# Patient Record
Sex: Male | Born: 1950 | Race: White | Hispanic: No | Marital: Married | Smoking: Current every day smoker
Health system: Southern US, Community
[De-identification: ages and names within clinical notes are randomized; demographics above are authoritative.]

## PROBLEM LIST (undated history)

## (undated) ENCOUNTER — Emergency Department (HOSPITAL_COMMUNITY): Discharge status: Against Medical Advice | Payer: BC Managed Care – PPO

## (undated) DIAGNOSIS — R351 Nocturia: Secondary | ICD-10-CM

## (undated) DIAGNOSIS — G4733 Obstructive sleep apnea (adult) (pediatric): Secondary | ICD-10-CM

## (undated) DIAGNOSIS — M7552 Bursitis of left shoulder: Secondary | ICD-10-CM

## (undated) DIAGNOSIS — R05 Cough: Secondary | ICD-10-CM

## (undated) DIAGNOSIS — R0600 Dyspnea, unspecified: Secondary | ICD-10-CM

## (undated) DIAGNOSIS — I2510 Atherosclerotic heart disease of native coronary artery without angina pectoris: Secondary | ICD-10-CM

## (undated) DIAGNOSIS — K229 Disease of esophagus, unspecified: Secondary | ICD-10-CM

## (undated) DIAGNOSIS — M1990 Unspecified osteoarthritis, unspecified site: Secondary | ICD-10-CM

## (undated) DIAGNOSIS — M7551 Bursitis of right shoulder: Secondary | ICD-10-CM

## (undated) DIAGNOSIS — I5022 Chronic systolic (congestive) heart failure: Secondary | ICD-10-CM

## (undated) DIAGNOSIS — J410 Simple chronic bronchitis: Secondary | ICD-10-CM

## (undated) DIAGNOSIS — C679 Malignant neoplasm of bladder, unspecified: Secondary | ICD-10-CM

## (undated) DIAGNOSIS — I10 Essential (primary) hypertension: Secondary | ICD-10-CM

## (undated) DIAGNOSIS — N281 Cyst of kidney, acquired: Secondary | ICD-10-CM

## (undated) DIAGNOSIS — K219 Gastro-esophageal reflux disease without esophagitis: Secondary | ICD-10-CM

## (undated) DIAGNOSIS — N509 Disorder of male genital organs, unspecified: Secondary | ICD-10-CM

## (undated) DIAGNOSIS — Z9889 Other specified postprocedural states: Secondary | ICD-10-CM

## (undated) DIAGNOSIS — E119 Type 2 diabetes mellitus without complications: Secondary | ICD-10-CM

## (undated) DIAGNOSIS — G4730 Sleep apnea, unspecified: Secondary | ICD-10-CM

## (undated) DIAGNOSIS — R7303 Prediabetes: Secondary | ICD-10-CM

## (undated) DIAGNOSIS — E785 Hyperlipidemia, unspecified: Secondary | ICD-10-CM

## (undated) DIAGNOSIS — R0602 Shortness of breath: Secondary | ICD-10-CM

## (undated) DIAGNOSIS — Z85828 Personal history of other malignant neoplasm of skin: Secondary | ICD-10-CM

## (undated) HISTORY — PX: TONSILLECTOMY: SUR1361

## (undated) HISTORY — DX: Disease of esophagus, unspecified: K22.9

## (undated) HISTORY — DX: Type 2 diabetes mellitus without complications: E11.9

## (undated) HISTORY — DX: Hyperlipidemia, unspecified: E78.5

## (undated) HISTORY — DX: Cyst of kidney, acquired: N28.1

## (undated) HISTORY — DX: Atherosclerotic heart disease of native coronary artery without angina pectoris: I25.10

## (undated) HISTORY — DX: Chronic systolic (congestive) heart failure: I50.22

## (undated) HISTORY — DX: Disorder of male genital organs, unspecified: N50.9

## (undated) HISTORY — DX: Sleep apnea, unspecified: G47.30

---

## 1986-05-20 HISTORY — PX: ESOPHAGEAL DILATION: SHX303

## 1990-05-20 HISTORY — PX: UVULECTOMY: SHX2631

## 2002-03-31 ENCOUNTER — Ambulatory Visit: Admission: RE | Admit: 2002-03-31 | Discharge: 2002-03-31 | Payer: Self-pay | Admitting: Pulmonary Disease

## 2002-04-21 ENCOUNTER — Ambulatory Visit: Admission: RE | Admit: 2002-04-21 | Discharge: 2002-04-21 | Payer: Self-pay | Admitting: Pulmonary Disease

## 2002-07-12 ENCOUNTER — Other Ambulatory Visit: Admission: RE | Admit: 2002-07-12 | Discharge: 2002-07-12 | Payer: Self-pay | Admitting: Dermatology

## 2003-05-21 HISTORY — PX: UVULOPALATOPHARYNGOPLASTY: SHX827

## 2011-11-18 DIAGNOSIS — N281 Cyst of kidney, acquired: Secondary | ICD-10-CM

## 2011-11-18 HISTORY — DX: Cyst of kidney, acquired: N28.1

## 2012-01-15 ENCOUNTER — Encounter: Payer: Self-pay | Admitting: Nurse Practitioner

## 2012-02-20 ENCOUNTER — Other Ambulatory Visit: Payer: Self-pay | Admitting: Urology

## 2012-02-21 MED ORDER — MITOMYCIN CHEMO FOR BLADDER INSTILLATION 40 MG: 40.0000 mg | Freq: Once | INTRAVENOUS | Status: DC

## 2012-03-09 ENCOUNTER — Encounter (HOSPITAL_BASED_OUTPATIENT_CLINIC_OR_DEPARTMENT_OTHER): Payer: Self-pay | Admitting: *Deleted

## 2012-03-09 NOTE — Progress Notes (Signed)
NPO AFTER MN. ARRIVES AT 0615. NEEDS ISTAT AND EKG. PT TO CALL WITH NAME OF BACK BP/ DIABETIC MEDS.

## 2012-03-13 ENCOUNTER — Encounter (HOSPITAL_BASED_OUTPATIENT_CLINIC_OR_DEPARTMENT_OTHER): Payer: Self-pay | Admitting: Anesthesiology

## 2012-03-13 ENCOUNTER — Encounter (HOSPITAL_BASED_OUTPATIENT_CLINIC_OR_DEPARTMENT_OTHER): Payer: Self-pay | Admitting: *Deleted

## 2012-03-13 ENCOUNTER — Ambulatory Visit (HOSPITAL_BASED_OUTPATIENT_CLINIC_OR_DEPARTMENT_OTHER): Payer: BC Managed Care – PPO | Admitting: Anesthesiology

## 2012-03-13 ENCOUNTER — Ambulatory Visit (HOSPITAL_BASED_OUTPATIENT_CLINIC_OR_DEPARTMENT_OTHER)
Admission: RE | Admit: 2012-03-13 | Discharge: 2012-03-13 | Disposition: A | Discharge status: Home / Self Care | Payer: BC Managed Care – PPO | Source: Ambulatory Visit | Attending: Urology | Admitting: Urology

## 2012-03-13 ENCOUNTER — Encounter (HOSPITAL_BASED_OUTPATIENT_CLINIC_OR_DEPARTMENT_OTHER)
Admission: RE | Disposition: A | Discharge status: Home / Self Care | Payer: Self-pay | Source: Ambulatory Visit | Attending: Urology

## 2012-03-13 DIAGNOSIS — D494 Neoplasm of unspecified behavior of bladder: Secondary | ICD-10-CM

## 2012-03-13 DIAGNOSIS — Z79899 Other long term (current) drug therapy: Secondary | ICD-10-CM | POA: Insufficient documentation

## 2012-03-13 DIAGNOSIS — J4489 Other specified chronic obstructive pulmonary disease: Secondary | ICD-10-CM | POA: Insufficient documentation

## 2012-03-13 DIAGNOSIS — G4730 Sleep apnea, unspecified: Secondary | ICD-10-CM | POA: Insufficient documentation

## 2012-03-13 DIAGNOSIS — I10 Essential (primary) hypertension: Secondary | ICD-10-CM | POA: Insufficient documentation

## 2012-03-13 DIAGNOSIS — F17200 Nicotine dependence, unspecified, uncomplicated: Secondary | ICD-10-CM | POA: Insufficient documentation

## 2012-03-13 DIAGNOSIS — C679 Malignant neoplasm of bladder, unspecified: Secondary | ICD-10-CM | POA: Insufficient documentation

## 2012-03-13 DIAGNOSIS — Z85828 Personal history of other malignant neoplasm of skin: Secondary | ICD-10-CM | POA: Insufficient documentation

## 2012-03-13 DIAGNOSIS — M109 Gout, unspecified: Secondary | ICD-10-CM | POA: Insufficient documentation

## 2012-03-13 DIAGNOSIS — J449 Chronic obstructive pulmonary disease, unspecified: Secondary | ICD-10-CM | POA: Insufficient documentation

## 2012-03-13 DIAGNOSIS — E119 Type 2 diabetes mellitus without complications: Secondary | ICD-10-CM | POA: Insufficient documentation

## 2012-03-13 HISTORY — DX: Gastro-esophageal reflux disease without esophagitis: K21.9

## 2012-03-13 HISTORY — DX: Essential (primary) hypertension: I10

## 2012-03-13 HISTORY — DX: Prediabetes: R73.03

## 2012-03-13 HISTORY — PX: CYSTOSCOPY WITH BIOPSY: SHX5122

## 2012-03-13 HISTORY — DX: Shortness of breath: R06.02

## 2012-03-13 HISTORY — DX: Simple chronic bronchitis: J41.0

## 2012-03-13 HISTORY — DX: Nocturia: R35.1

## 2012-03-13 HISTORY — DX: Unspecified osteoarthritis, unspecified site: M19.90

## 2012-03-13 LAB — GLUCOSE, CAPILLARY: Glucose-Capillary: 126 mg/dL — ABNORMAL HIGH (ref 70–99)

## 2012-03-13 LAB — POCT I-STAT 4, (NA,K, GLUC, HGB,HCT)
Glucose, Bld: 149 mg/dL — ABNORMAL HIGH (ref 70–99)
Potassium: 3.9 mEq/L (ref 3.5–5.1)
Sodium: 141 mEq/L (ref 135–145)

## 2012-03-13 SURGERY — CYSTOSCOPY, WITH BIOPSY
Anesthesia: General | Site: Bladder | Wound class: Clean Contaminated

## 2012-03-13 MED ORDER — ONDANSETRON HCL 4 MG/2ML IJ SOLN
INTRAMUSCULAR | Status: DC | PRN
  Administered 2012-03-13: 4 mg via INTRAVENOUS

## 2012-03-13 MED ORDER — CEFAZOLIN SODIUM 1-5 GM-% IV SOLN: 1.0000 g | INTRAVENOUS | Status: DC

## 2012-03-13 MED ORDER — OXYCODONE-ACETAMINOPHEN 5-325 MG PO TABS: 1.0000 | ORAL_TABLET | ORAL | Status: DC | PRN

## 2012-03-13 MED ORDER — CIPROFLOXACIN HCL 500 MG PO TABS: 500.0000 mg | ORAL_TABLET | Freq: Two times a day (BID) | ORAL | Status: DC

## 2012-03-13 MED ORDER — LACTATED RINGERS IV SOLN: INTRAVENOUS | Status: DC

## 2012-03-13 MED ORDER — PROPOFOL 10 MG/ML IV BOLUS
INTRAVENOUS | Status: DC | PRN
  Administered 2012-03-13: 300 mg via INTRAVENOUS

## 2012-03-13 MED ORDER — CEFAZOLIN SODIUM-DEXTROSE 2-3 GM-% IV SOLR
2.0000 g | INTRAVENOUS | Status: AC
  Administered 2012-03-13: 3 g via INTRAVENOUS

## 2012-03-13 MED ORDER — MITOMYCIN CHEMO FOR BLADDER INSTILLATION 40 MG
40.0000 mg | Freq: Once | INTRAVENOUS | Status: AC
  Administered 2012-03-13: 40 mg via INTRAVESICAL
  Filled 2012-03-13: qty 40

## 2012-03-13 MED ORDER — LACTATED RINGERS IV SOLN
INTRAVENOUS | Status: DC
  Administered 2012-03-13 (×4): via INTRAVENOUS

## 2012-03-13 MED ORDER — BELLADONNA ALKALOIDS-OPIUM 16.2-60 MG RE SUPP
RECTAL | Status: DC | PRN
  Administered 2012-03-13: 1 via RECTAL

## 2012-03-13 MED ORDER — MITOMYCIN CHEMO FOR BLADDER INSTILLATION 40 MG: 40.0000 mg | Freq: Once | INTRAVENOUS | Status: DC

## 2012-03-13 MED ORDER — MEPERIDINE HCL 25 MG/ML IJ SOLN: 6.2500 mg | INTRAMUSCULAR | Status: DC | PRN

## 2012-03-13 MED ORDER — STERILE WATER FOR IRRIGATION IR SOLN
Status: DC | PRN
  Administered 2012-03-13: 3000 mL via INTRAVESICAL

## 2012-03-13 MED ORDER — LIDOCAINE HCL (CARDIAC) 20 MG/ML IV SOLN
INTRAVENOUS | Status: DC | PRN
  Administered 2012-03-13: 100 mg via INTRAVENOUS

## 2012-03-13 MED ORDER — PROMETHAZINE HCL 25 MG/ML IJ SOLN: 6.2500 mg | INTRAMUSCULAR | Status: DC | PRN

## 2012-03-13 MED ORDER — FENTANYL CITRATE 0.05 MG/ML IJ SOLN
25.0000 ug | INTRAMUSCULAR | Status: DC | PRN
  Administered 2012-03-13 (×2): 25 ug via INTRAVENOUS

## 2012-03-13 MED ORDER — PHENAZOPYRIDINE HCL 200 MG PO TABS: 200.0000 mg | ORAL_TABLET | Freq: Three times a day (TID) | ORAL | Status: DC | PRN

## 2012-03-13 MED ORDER — FENTANYL CITRATE 0.05 MG/ML IJ SOLN
INTRAMUSCULAR | Status: DC | PRN
  Administered 2012-03-13 (×2): 25 ug via INTRAVENOUS
  Administered 2012-03-13: 50 ug via INTRAVENOUS
  Administered 2012-03-13 (×2): 25 ug via INTRAVENOUS

## 2012-03-13 SURGICAL SUPPLY — 17 items
BAG DRAIN URO-CYSTO SKYTR STRL (DRAIN) ×2 IMPLANT
BAG DRN UROCATH (DRAIN) ×1
BAG URINE DRAINAGE (UROLOGICAL SUPPLIES) ×1 IMPLANT
CANISTER SUCT LVC 12 LTR MEDI- (MISCELLANEOUS) ×1 IMPLANT
CATH FOLEY 2WAY SLVR  5CC 18FR (CATHETERS) ×1
CATH FOLEY 2WAY SLVR 5CC 18FR (CATHETERS) IMPLANT
CLOTH BEACON ORANGE TIMEOUT ST (SAFETY) ×2 IMPLANT
DRAPE CAMERA CLOSED 9X96 (DRAPES) ×2 IMPLANT
ELECT REM PT RETURN 9FT ADLT (ELECTROSURGICAL) ×2
ELECTRODE REM PT RTRN 9FT ADLT (ELECTROSURGICAL) ×1 IMPLANT
GLOVE BIO SURGEON STRL SZ7.5 (GLOVE) ×2 IMPLANT
GLOVE INDICATOR 6.5 STRL GRN (GLOVE) ×1 IMPLANT
GOWN STRL REIN XL XLG (GOWN DISPOSABLE) ×2 IMPLANT
NEEDLE HYPO 22GX1.5 SAFETY (NEEDLE) IMPLANT
NS IRRIG 500ML POUR BTL (IV SOLUTION) IMPLANT
PACK CYSTOSCOPY (CUSTOM PROCEDURE TRAY) ×2 IMPLANT
WATER STERILE IRR 3000ML UROMA (IV SOLUTION) ×2 IMPLANT

## 2012-03-13 NOTE — Op Note (Signed)
Preoperative diagnosis: Bladder neoplasm, gross hematuria Postoperative diagnosis: Bladder neoplasm, gross hematuria  Procedure: Exam under anesthesia  Cystoscopy, bladder biopsy with resection tumor and fulguration Mitomycin-C installation in PACU  Surgeon: Mena Goes   Anesthesia: Gen.  Findings: Exam under anesthesia-the penis was normal without lesion, the testicles were normal without mass. On digital rectal exam the prostate was small without hard area or nodule. A B&O suppository was placed. Cystoscopy-the anterior urethra was normal. The prostatic urethra was short and nonobstructing although there was a high bladder neck. The bladder was examined with a 70 and 12 lens and noted to be normal apart from a papillary tumor lateral to the right ureteral orifice. No other lesions were noted. The trigone and ureteral orifice these were in their normal anatomic position.  Description of procedure: After consent was obtained patient brought to the operating room. A timeout was performed to confirm the patient and procedure. After adequate anesthesia he is placed in lithotomy position and exam was performed.   He was prepped and draped in the usual fashion and a cystoscope was passed per urethra. The bladder was examined with a 12 and 70 lens. The tumor lateral to the right ureteral orifice was papillary in appearance and on a single stalk. The cold cup biopsy forceps were placed and by opening the forceps I was able to get around the base of the tumor by slightly elevating the tumor with the forceps. It took 2-3 biopsies to remove the base with the tumor. It took 1 more biopsy and the tumor base for staging and sent this separately. Adequate hemostasis was then obtained with the Bugbee and fulguration. The ureteral orifice these had good clear reflux bilaterally after biopsy and fulguration. The patient was awakened and taken to the recovery room in stable condition. There were no complications and  blood loss was minimal.  Drains: 75 French Foley which will be removed in the recovery room  Specimens: #1 bladder tumor #2 bladder tumor base to pathology  Disposition: Patient stable to PACU

## 2012-03-13 NOTE — Interval H&P Note (Signed)
History and Physical Interval Note:  03/13/2012 7:30 AM  Timothy Lee  has presented today for surgery, with the diagnosis of Bladder Neoplasm  The various methods of treatment have been discussed with the patient and family. After consideration of risks, benefits and other options for treatment, the patient has consented to  Procedure(s) (LRB) with comments: CYSTOSCOPY WITH BIOPSY (N/A) - 90 mins requested for this case  BLADDER BIOPSY  [phone] home # [phone]  [employer]  **ADDRESS CORRECTION [address] [place] [address] as a surgical intervention .  We also discussed Mitomycin-C instillation in PACU. We discussed side effects of the proposed treatment, the likelihood of the patient achieving the goals of the procedure, and any potential problems that might occur during the procedure or recuperation. The patient's history has been reviewed, patient examined, no change in status, stable for surgery.  I have reviewed the patient's chart and labs.  Questions were answered to the patient's satisfaction.    We discussed the propensity of bladder neoplasms to recur and progress and the importance of f/u for all future clinic visits.    Antony Haste
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
## 2012-03-13 NOTE — Progress Notes (Signed)
Foley cath unpluged and hooked to straight drain

## 2012-03-13 NOTE — Anesthesia Postprocedure Evaluation (Signed)
  Anesthesia Post-op Note  Patient: Timothy Lee  Procedure(s) Performed: Procedure(s) (LRB): CYSTOSCOPY WITH BIOPSY (N/A)  Patient Location: PACU  Anesthesia Type: General  Level of Consciousness: awake and alert   Airway and Oxygen Therapy: Patient Spontanous Breathing  Post-op Pain: mild  Post-op Assessment: Post-op Vital signs reviewed, Patient's Cardiovascular Status Stable, Respiratory Function Stable, Patent Airway and No signs of Nausea or vomiting  Post-op Vital Signs: stable  Complications: No apparent anesthesia complications

## 2012-03-13 NOTE — Transfer of Care (Signed)
Immediate Anesthesia Transfer of Care Note  Patient: Timothy Lee  Procedure(s) Performed: Procedure(s) (LRB): CYSTOSCOPY WITH BIOPSY (N/A)  Patient Location: Patient transported to PACU with oxygen via face mask at 4 Liters / Min  Anesthesia Type: General  Level of Consciousness: awake and alert   Airway & Oxygen Therapy: Patient Spontanous Breathing and Patient connected to face mask oxygen  Post-op Assessment: Report given to PACU RN and Post -op Vital signs reviewed and stable  Post vital signs: Reviewed and stable  Dentition: Teeth and oropharynx remain in pre-op condition  Complications: No apparent anesthesia complications

## 2012-03-13 NOTE — Anesthesia Procedure Notes (Signed)
Procedure Name: LMA Insertion Date/Time: 03/13/2012 7:38 AM Performed by: Fran Lowes Pre-anesthesia Checklist: Patient identified, Emergency Drugs available, Suction available and Patient being monitored Patient Re-evaluated:Patient Re-evaluated prior to inductionOxygen Delivery Method: Circle System Utilized Preoxygenation: Pre-oxygenation with 100% oxygen Intubation Type: IV induction Ventilation: Mask ventilation without difficulty LMA: LMA inserted LMA Size: 5.0 Number of attempts: 1 Airway Equipment and Method: bite block Placement Confirmation: positive ETCO2 Tube secured with: Tape Dental Injury: Teeth and Oropharynx as per pre-operative assessment

## 2012-03-13 NOTE — Anesthesia Preprocedure Evaluation (Addendum)
Anesthesia Evaluation  Patient identified by MRN, date of birth, ID band Patient awake    Reviewed: Allergy & Precautions, H&P , NPO status , Patient's Chart, lab work & pertinent test results  Airway Mallampati: II TM Distance: >3 FB Neck ROM: Full    Dental No notable dental hx.    Pulmonary shortness of breath and with exertion, sleep apnea , COPDCurrent Smoker,  + rhonchi   Pulmonary exam normal       Cardiovascular hypertension, Pt. on medications negative cardio ROS  Rhythm:Regular Rate:Normal     Neuro/Psych negative neurological ROS  negative psych ROS   GI/Hepatic negative GI ROS, Neg liver ROS,   Endo/Other  negative endocrine ROSdiabetes, Oral Hypoglycemic Agents  Renal/GU negative Renal ROS  negative genitourinary   Musculoskeletal negative musculoskeletal ROS (+)   Abdominal   Peds negative pediatric ROS (+)  Hematology negative hematology ROS (+)   Anesthesia Other Findings Upper and lower front bridges  Reproductive/Obstetrics negative OB ROS                          Anesthesia Physical Anesthesia Plan  ASA: III  Anesthesia Plan: General   Post-op Pain Management:    Induction: Intravenous  Airway Management Planned: LMA  Additional Equipment:   Intra-op Plan:   Post-operative Plan: Extubation in OR  Informed Consent: I have reviewed the patients History and Physical, chart, labs and discussed the procedure including the risks, benefits and alternatives for the proposed anesthesia with the patient or authorized representative who has indicated his/her understanding and acceptance.   Dental advisory given  Plan Discussed with: CRNA  Anesthesia Plan Comments:         Anesthesia Quick Evaluation

## 2012-03-16 ENCOUNTER — Encounter (HOSPITAL_BASED_OUTPATIENT_CLINIC_OR_DEPARTMENT_OTHER): Payer: Self-pay | Admitting: Urology

## 2013-05-27 LAB — BASIC METABOLIC PANEL
BUN: 9 (ref 4–21)
CREATININE: 0.8 (ref 0.6–1.3)
GLUCOSE: 170
Potassium: 4.1 (ref 3.4–5.3)
Sodium: 135 — AB (ref 137–147)

## 2013-05-27 LAB — HEMOGLOBIN A1C: HEMOGLOBIN A1C: 8

## 2013-05-27 LAB — LIPID PANEL
CHOLESTEROL: 149 (ref 0–200)
HDL: 42 (ref 35–70)
LDL Cholesterol: 84
Triglycerides: 114 (ref 40–160)

## 2013-05-27 LAB — CBC AND DIFFERENTIAL
HCT: 47 (ref 41–53)
Hemoglobin: 16 (ref 13.5–17.5)
PLATELETS: 269 (ref 150–399)
WBC: 7.1

## 2013-05-27 LAB — HEPATIC FUNCTION PANEL
ALK PHOS: 61 (ref 25–125)
ALT: 35 (ref 10–40)
AST: 18 (ref 14–40)
Bilirubin, Total: 1.1

## 2014-08-17 ENCOUNTER — Other Ambulatory Visit: Payer: Self-pay | Admitting: Dermatology

## 2016-09-25 ENCOUNTER — Other Ambulatory Visit: Payer: Self-pay | Admitting: Dermatology

## 2016-09-25 DIAGNOSIS — D492 Neoplasm of unspecified behavior of bone, soft tissue, and skin: Secondary | ICD-10-CM | POA: Diagnosis not present

## 2016-09-25 DIAGNOSIS — L219 Seborrheic dermatitis, unspecified: Secondary | ICD-10-CM | POA: Diagnosis not present

## 2016-09-25 DIAGNOSIS — D229 Melanocytic nevi, unspecified: Secondary | ICD-10-CM | POA: Diagnosis not present

## 2016-09-25 DIAGNOSIS — L570 Actinic keratosis: Secondary | ICD-10-CM | POA: Diagnosis not present

## 2016-09-25 DIAGNOSIS — C44319 Basal cell carcinoma of skin of other parts of face: Secondary | ICD-10-CM | POA: Diagnosis not present

## 2016-10-10 ENCOUNTER — Other Ambulatory Visit: Payer: Self-pay | Admitting: Dermatology

## 2016-10-10 DIAGNOSIS — C44319 Basal cell carcinoma of skin of other parts of face: Secondary | ICD-10-CM | POA: Diagnosis not present

## 2016-10-14 DIAGNOSIS — R2232 Localized swelling, mass and lump, left upper limb: Secondary | ICD-10-CM | POA: Diagnosis not present

## 2016-10-14 DIAGNOSIS — M79602 Pain in left arm: Secondary | ICD-10-CM | POA: Diagnosis not present

## 2016-10-15 DIAGNOSIS — R2232 Localized swelling, mass and lump, left upper limb: Secondary | ICD-10-CM | POA: Diagnosis not present

## 2016-10-16 DIAGNOSIS — Z4802 Encounter for removal of sutures: Secondary | ICD-10-CM | POA: Diagnosis not present

## 2016-12-26 DIAGNOSIS — C675 Malignant neoplasm of bladder neck: Secondary | ICD-10-CM | POA: Diagnosis not present

## 2016-12-26 DIAGNOSIS — C672 Malignant neoplasm of lateral wall of bladder: Secondary | ICD-10-CM | POA: Diagnosis not present

## 2016-12-26 LAB — BASIC METABOLIC PANEL
BUN: 15 (ref 4–21)
Creatinine: 0.5 — AB (ref 0.6–1.3)

## 2017-01-06 ENCOUNTER — Encounter: Payer: Self-pay | Admitting: Nurse Practitioner

## 2017-01-06 DIAGNOSIS — C672 Malignant neoplasm of lateral wall of bladder: Secondary | ICD-10-CM | POA: Diagnosis not present

## 2017-01-06 DIAGNOSIS — D494 Neoplasm of unspecified behavior of bladder: Secondary | ICD-10-CM | POA: Diagnosis not present

## 2017-01-06 DIAGNOSIS — D3500 Benign neoplasm of unspecified adrenal gland: Secondary | ICD-10-CM | POA: Diagnosis not present

## 2017-01-14 LAB — PSA: PSA: 1.88

## 2017-01-16 ENCOUNTER — Other Ambulatory Visit: Payer: Self-pay | Admitting: Urology

## 2017-01-16 MED ORDER — SODIUM CHLORIDE 0.9 % IV SOLN: 50.0000 mg | Freq: Once | INTRAVENOUS | Status: DC

## 2017-01-27 ENCOUNTER — Encounter (HOSPITAL_BASED_OUTPATIENT_CLINIC_OR_DEPARTMENT_OTHER): Payer: Self-pay | Admitting: *Deleted

## 2017-03-11 ENCOUNTER — Encounter (HOSPITAL_BASED_OUTPATIENT_CLINIC_OR_DEPARTMENT_OTHER): Admission: RE | Payer: Self-pay | Source: Ambulatory Visit

## 2017-03-11 ENCOUNTER — Ambulatory Visit (HOSPITAL_BASED_OUTPATIENT_CLINIC_OR_DEPARTMENT_OTHER): Admission: RE | Admit: 2017-03-11 | Payer: BLUE CROSS/BLUE SHIELD | Source: Ambulatory Visit | Admitting: Urology

## 2017-03-11 HISTORY — DX: Malignant neoplasm of bladder, unspecified: C67.9

## 2017-03-11 HISTORY — DX: Other specified postprocedural states: Z98.890

## 2017-03-11 HISTORY — DX: Other specified postprocedural states: Z85.828

## 2017-03-11 SURGERY — TURBT (TRANSURETHRAL RESECTION OF BLADDER TUMOR)
Anesthesia: General

## 2017-05-05 ENCOUNTER — Other Ambulatory Visit: Payer: Self-pay | Admitting: Urology

## 2017-05-15 ENCOUNTER — Other Ambulatory Visit: Payer: Self-pay

## 2017-05-15 ENCOUNTER — Encounter (HOSPITAL_BASED_OUTPATIENT_CLINIC_OR_DEPARTMENT_OTHER): Payer: Self-pay | Admitting: *Deleted

## 2017-05-15 NOTE — Progress Notes (Signed)
SPOKE W/ PT VIA PHONE FOR PRE-OP INTERVIEW.  NPO AFTER MN.  ARRIVE AT 7125.  NEEDS ISTAT AND EKG.

## 2017-05-22 ENCOUNTER — Encounter: Payer: Self-pay | Admitting: Nurse Practitioner

## 2017-05-22 ENCOUNTER — Encounter: Payer: Self-pay | Admitting: Urology

## 2017-05-22 DIAGNOSIS — R351 Nocturia: Secondary | ICD-10-CM | POA: Diagnosis not present

## 2017-05-22 DIAGNOSIS — C672 Malignant neoplasm of lateral wall of bladder: Secondary | ICD-10-CM | POA: Diagnosis not present

## 2017-05-23 ENCOUNTER — Ambulatory Visit (HOSPITAL_BASED_OUTPATIENT_CLINIC_OR_DEPARTMENT_OTHER): Payer: Medicare Other | Admitting: Anesthesiology

## 2017-05-23 ENCOUNTER — Ambulatory Visit (HOSPITAL_BASED_OUTPATIENT_CLINIC_OR_DEPARTMENT_OTHER)
Admission: RE | Admit: 2017-05-23 | Discharge: 2017-05-23 | Disposition: A | Discharge status: Home / Self Care | Payer: Medicare Other | Source: Ambulatory Visit | Attending: Urology | Admitting: Urology

## 2017-05-23 ENCOUNTER — Encounter (HOSPITAL_BASED_OUTPATIENT_CLINIC_OR_DEPARTMENT_OTHER): Payer: Self-pay | Admitting: *Deleted

## 2017-05-23 ENCOUNTER — Encounter (HOSPITAL_BASED_OUTPATIENT_CLINIC_OR_DEPARTMENT_OTHER)
Admission: RE | Disposition: A | Discharge status: Home / Self Care | Payer: Self-pay | Source: Ambulatory Visit | Attending: Urology

## 2017-05-23 ENCOUNTER — Other Ambulatory Visit: Payer: Self-pay

## 2017-05-23 DIAGNOSIS — Z6833 Body mass index (BMI) 33.0-33.9, adult: Secondary | ICD-10-CM | POA: Insufficient documentation

## 2017-05-23 DIAGNOSIS — Z820 Family history of epilepsy and other diseases of the nervous system: Secondary | ICD-10-CM | POA: Insufficient documentation

## 2017-05-23 DIAGNOSIS — E669 Obesity, unspecified: Secondary | ICD-10-CM | POA: Diagnosis not present

## 2017-05-23 DIAGNOSIS — D414 Neoplasm of uncertain behavior of bladder: Secondary | ICD-10-CM

## 2017-05-23 DIAGNOSIS — E119 Type 2 diabetes mellitus without complications: Secondary | ICD-10-CM | POA: Diagnosis not present

## 2017-05-23 DIAGNOSIS — C675 Malignant neoplasm of bladder neck: Secondary | ICD-10-CM | POA: Diagnosis not present

## 2017-05-23 DIAGNOSIS — Z79899 Other long term (current) drug therapy: Secondary | ICD-10-CM | POA: Insufficient documentation

## 2017-05-23 DIAGNOSIS — C672 Malignant neoplasm of lateral wall of bladder: Secondary | ICD-10-CM | POA: Diagnosis not present

## 2017-05-23 DIAGNOSIS — G4730 Sleep apnea, unspecified: Secondary | ICD-10-CM | POA: Insufficient documentation

## 2017-05-23 DIAGNOSIS — I10 Essential (primary) hypertension: Secondary | ICD-10-CM | POA: Diagnosis not present

## 2017-05-23 DIAGNOSIS — F17200 Nicotine dependence, unspecified, uncomplicated: Secondary | ICD-10-CM | POA: Insufficient documentation

## 2017-05-23 DIAGNOSIS — C679 Malignant neoplasm of bladder, unspecified: Secondary | ICD-10-CM | POA: Diagnosis present

## 2017-05-23 HISTORY — DX: Bursitis of right shoulder: M75.51

## 2017-05-23 HISTORY — DX: Cough: R05

## 2017-05-23 HISTORY — PX: TRANSURETHRAL RESECTION OF BLADDER TUMOR: SHX2575

## 2017-05-23 HISTORY — DX: Bursitis of left shoulder: M75.52

## 2017-05-23 HISTORY — DX: Obstructive sleep apnea (adult) (pediatric): G47.33

## 2017-05-23 LAB — POCT I-STAT, CHEM 8
BUN: 19 mg/dL (ref 6–20)
CREATININE: 0.7 mg/dL (ref 0.61–1.24)
Calcium, Ion: 1.27 mmol/L (ref 1.15–1.40)
Chloride: 100 mmol/L — ABNORMAL LOW (ref 101–111)
GLUCOSE: 252 mg/dL — AB (ref 65–99)
HCT: 49 % (ref 39.0–52.0)
HEMOGLOBIN: 16.7 g/dL (ref 13.0–17.0)
Potassium: 4.1 mmol/L (ref 3.5–5.1)
Sodium: 140 mmol/L (ref 135–145)
TCO2: 27 mmol/L (ref 22–32)

## 2017-05-23 LAB — GLUCOSE, CAPILLARY: GLUCOSE-CAPILLARY: 222 mg/dL — AB (ref 65–99)

## 2017-05-23 SURGERY — TURBT (TRANSURETHRAL RESECTION OF BLADDER TUMOR)
Anesthesia: General

## 2017-05-23 MED ORDER — DEXAMETHASONE SODIUM PHOSPHATE 10 MG/ML IJ SOLN
INTRAMUSCULAR | Status: DC | PRN
  Administered 2017-05-23: 5 mg via INTRAVENOUS

## 2017-05-23 MED ORDER — FENTANYL CITRATE (PF) 100 MCG/2ML IJ SOLN
INTRAMUSCULAR | Status: DC | PRN
  Administered 2017-05-23 (×3): 50 ug via INTRAVENOUS

## 2017-05-23 MED ORDER — PROMETHAZINE HCL 25 MG/ML IJ SOLN
6.2500 mg | INTRAMUSCULAR | Status: DC | PRN
  Filled 2017-05-23: qty 1

## 2017-05-23 MED ORDER — LIDOCAINE 2% (20 MG/ML) 5 ML SYRINGE
INTRAMUSCULAR | Status: DC | PRN
  Administered 2017-05-23: 100 mg via INTRAVENOUS

## 2017-05-23 MED ORDER — CEFAZOLIN SODIUM-DEXTROSE 2-4 GM/100ML-% IV SOLN
INTRAVENOUS | Status: AC
  Filled 2017-05-23: qty 100

## 2017-05-23 MED ORDER — PROPOFOL 10 MG/ML IV BOLUS
INTRAVENOUS | Status: DC | PRN
  Administered 2017-05-23: 200 mg via INTRAVENOUS

## 2017-05-23 MED ORDER — ONDANSETRON HCL 4 MG/2ML IJ SOLN
INTRAMUSCULAR | Status: AC
  Filled 2017-05-23: qty 2

## 2017-05-23 MED ORDER — MIDAZOLAM HCL 2 MG/2ML IJ SOLN
INTRAMUSCULAR | Status: AC
Start: 2017-05-23 — End: 2017-05-23
  Filled 2017-05-23: qty 2

## 2017-05-23 MED ORDER — BELLADONNA ALKALOIDS-OPIUM 16.2-60 MG RE SUPP
RECTAL | Status: AC
  Filled 2017-05-23: qty 1

## 2017-05-23 MED ORDER — FENTANYL CITRATE (PF) 100 MCG/2ML IJ SOLN
INTRAMUSCULAR | Status: AC
  Filled 2017-05-23: qty 2

## 2017-05-23 MED ORDER — CEFAZOLIN SODIUM-DEXTROSE 2-4 GM/100ML-% IV SOLN
2.0000 g | Freq: Once | INTRAVENOUS | Status: AC
  Administered 2017-05-23: 2 g via INTRAVENOUS
  Filled 2017-05-23: qty 100

## 2017-05-23 MED ORDER — DEXAMETHASONE SODIUM PHOSPHATE 10 MG/ML IJ SOLN
INTRAMUSCULAR | Status: AC
  Filled 2017-05-23: qty 1

## 2017-05-23 MED ORDER — SODIUM CHLORIDE 0.9 % IR SOLN
Status: DC | PRN
Start: 2017-05-23 — End: 2017-05-23
  Administered 2017-05-23 (×2): 3000 mL via INTRAVESICAL

## 2017-05-23 MED ORDER — LIDOCAINE 2% (20 MG/ML) 5 ML SYRINGE
INTRAMUSCULAR | Status: AC
  Filled 2017-05-23: qty 5

## 2017-05-23 MED ORDER — PROPOFOL 10 MG/ML IV BOLUS
INTRAVENOUS | Status: AC
  Filled 2017-05-23: qty 40

## 2017-05-23 MED ORDER — MIDAZOLAM HCL 5 MG/5ML IJ SOLN
INTRAMUSCULAR | Status: DC | PRN
  Administered 2017-05-23: 2 mg via INTRAVENOUS

## 2017-05-23 MED ORDER — HYDROMORPHONE HCL 1 MG/ML IJ SOLN
0.2500 mg | INTRAMUSCULAR | Status: DC | PRN
  Filled 2017-05-23: qty 0.5

## 2017-05-23 MED ORDER — FENTANYL CITRATE (PF) 100 MCG/2ML IJ SOLN
25.0000 ug | INTRAMUSCULAR | Status: DC | PRN
  Administered 2017-05-23: 50 ug via INTRAVENOUS
  Filled 2017-05-23: qty 1

## 2017-05-23 MED ORDER — LIDOCAINE HCL 2 % EX GEL
CUTANEOUS | Status: DC | PRN
  Administered 2017-05-23: 1 via URETHRAL

## 2017-05-23 MED ORDER — LACTATED RINGERS IV SOLN
INTRAVENOUS | Status: DC
  Administered 2017-05-23: 08:00:00 via INTRAVENOUS
  Filled 2017-05-23: qty 1000

## 2017-05-23 MED ORDER — ONDANSETRON HCL 4 MG/2ML IJ SOLN
INTRAMUSCULAR | Status: DC | PRN
  Administered 2017-05-23: 4 mg via INTRAVENOUS

## 2017-05-23 MED ORDER — SODIUM CHLORIDE 0.9 % IV SOLN
50.0000 mg | Freq: Once | INTRAVENOUS | Status: AC
  Administered 2017-05-23: 50 mg via INTRAVESICAL
  Filled 2017-05-23: qty 25

## 2017-05-23 SURGICAL SUPPLY — 26 items
BAG DRAIN URO-CYSTO SKYTR STRL (DRAIN) ×2 IMPLANT
BAG DRN ANRFLXCHMBR STRAP LEK (BAG)
BAG DRN UROCATH (DRAIN) ×1
BAG URINE DRAINAGE (UROLOGICAL SUPPLIES) IMPLANT
BAG URINE LEG 19OZ MD ST LTX (BAG) IMPLANT
CATH FOLEY 2WAY SLVR  5CC 16FR (CATHETERS) ×1
CATH FOLEY 2WAY SLVR  5CC 20FR (CATHETERS)
CATH FOLEY 2WAY SLVR  5CC 22FR (CATHETERS)
CATH FOLEY 2WAY SLVR 5CC 16FR (CATHETERS) IMPLANT
CATH FOLEY 2WAY SLVR 5CC 20FR (CATHETERS) IMPLANT
CATH FOLEY 2WAY SLVR 5CC 22FR (CATHETERS) IMPLANT
CLOTH BEACON ORANGE TIMEOUT ST (SAFETY) ×2 IMPLANT
DRSG TELFA 3X8 NADH (GAUZE/BANDAGES/DRESSINGS) IMPLANT
ELECT REM PT RETURN 9FT ADLT (ELECTROSURGICAL) ×2
ELECTRODE REM PT RTRN 9FT ADLT (ELECTROSURGICAL) ×1 IMPLANT
EVACUATOR MICROVAS BLADDER (UROLOGICAL SUPPLIES) IMPLANT
GLOVE BIO SURGEON STRL SZ7.5 (GLOVE) ×2 IMPLANT
GOWN STRL REUS W/TWL LRG LVL3 (GOWN DISPOSABLE) ×2 IMPLANT
HOLDER FOLEY CATH W/STRAP (MISCELLANEOUS) IMPLANT
KIT RM TURNOVER CYSTO AR (KITS) ×2 IMPLANT
LOOP CUT BIPOLAR 24F LRG (ELECTROSURGICAL) ×1 IMPLANT
MANIFOLD NEPTUNE II (INSTRUMENTS) IMPLANT
PACK CYSTO (CUSTOM PROCEDURE TRAY) ×2 IMPLANT
PAD DRESSING TELFA 3X8 NADH (GAUZE/BANDAGES/DRESSINGS) IMPLANT
PLUG CATH AND CAP STER (CATHETERS) IMPLANT
TUBE CONNECTING 12X1/4 (SUCTIONS) IMPLANT

## 2017-05-23 NOTE — Anesthesia Postprocedure Evaluation (Signed)
Anesthesia Post Note  Patient: Timothy Lee  Procedure(s) Performed: TRANSURETHRAL RESECTION OF BLADDER TUMOR / (TURBT) WITH INSTILLATION OF EPIRUBICIN (N/A )     Patient location during evaluation: PACU Anesthesia Type: General Level of consciousness: awake and alert Pain management: pain level controlled Vital Signs Assessment: post-procedure vital signs reviewed and stable Respiratory status: spontaneous breathing, nonlabored ventilation, respiratory function stable and patient connected to nasal cannula oxygen Cardiovascular status: blood pressure returned to baseline and stable Postop Assessment: no apparent nausea or vomiting Anesthetic complications: no    Last Vitals:  Vitals:   05/23/17 1015 05/23/17 1030  BP: 135/85 137/86  Pulse: 95 86  Resp: (!) 22 17  Temp:    SpO2: 95% 96%    Last Pain:  Vitals:   05/23/17 0656  TempSrc: Oral                 Marra Fraga S

## 2017-05-23 NOTE — Interval H&P Note (Signed)
History and Physical Interval Note:  05/23/2017 9:28 AM  Timothy Lee  has presented today for surgery, with the diagnosis of BLADDER CANCER, LATERAL  The various methods of treatment have been discussed with the patient and family. After consideration of risks, benefits and other options for treatment, the patient has consented to  Procedure(s): TRANSURETHRAL RESECTION OF BLADDER TUMOR / (TURBT) WITH INSTILLATION OF EPIRUBICIN (N/A) as a surgical intervention.  The patient's history has been reviewed, patient examined, no change in status, stable for surgery. Discussed again he has a potenitially life threatening condition and the importance of f/u (not waiting months/years in between visits and procedures). Discussed he may need a foley post-op and may need a staged procedure with return to OR. We also discussed again link between smoking and GU cancer. His wife said "he's lost the power to make health care decisions".  I have reviewed the patient's chart and labs.  Questions were answered to the patient's satisfaction.     [name]

## 2017-05-23 NOTE — Anesthesia Procedure Notes (Signed)
Procedure Name: LMA Insertion Date/Time: 05/23/2017 9:35 AM Performed by: Bonney Aid, CRNA Pre-anesthesia Checklist: Patient identified, Emergency Drugs available, Suction available and Patient being monitored Patient Re-evaluated:Patient Re-evaluated prior to induction Oxygen Delivery Method: Circle system utilized Preoxygenation: Pre-oxygenation with 100% oxygen Induction Type: IV induction Ventilation: Mask ventilation without difficulty LMA: LMA inserted LMA Size: 5.0 Number of attempts: 1 Airway Equipment and Method: Bite block Placement Confirmation: positive ETCO2 Tube secured with: Tape Dental Injury: Teeth and Oropharynx as per pre-operative assessment

## 2017-05-23 NOTE — Discharge Instructions (Signed)
Cystoscopy, Care After °Refer to this sheet in the next few weeks. These instructions provide you with information about caring for yourself after your procedure. Your health care provider may also give you more specific instructions. Your treatment has been planned according to current medical practices, but problems sometimes occur. Call your health care provider if you have any problems or questions after your procedure. °What can I expect after the procedure? °After the procedure, it is common to have: °· Mild pain when you urinate. Pain should stop within a few minutes after you urinate. This may last for up to 1 week. °· A small amount of blood in your urine for several days. °· Feeling like you need to urinate but producing only a small amount of urine. ° °Follow these instructions at home: ° °Medicines °· Take over-the-counter and prescription medicines only as told by your health care provider. °· If you were prescribed an antibiotic medicine, take it as told by your health care provider. Do not stop taking the antibiotic even if you start to feel better. °General instructions ° °· Return to your normal activities as told by your health care provider. Ask your health care provider what activities are safe for you. °· Do not drive for 24 hours if you received a sedative. °· Watch for any blood in your urine. If the amount of blood in your urine increases, call your health care provider. °· Follow instructions from your health care provider about eating or drinking restrictions. °· If a tissue sample was removed for testing (biopsy) during your procedure, it is your responsibility to get your test results. Ask your health care provider or the department performing the test when your results will be ready. °· Drink enough fluid to keep your urine clear or pale yellow. °· Keep all follow-up visits as told by your health care provider. This is important. °Contact a health care provider if: °· You have pain that  gets worse or does not get better with medicine, especially pain when you urinate. °· You have difficulty urinating. °Get help right away if: °· You have more blood in your urine. °· You have blood clots in your urine. °· You have abdominal pain. °· You have a fever or chills. °· You are unable to urinate. °This information is not intended to replace advice given to you by your health care provider. Make sure you discuss any questions you have with your health care provider. °Document Released: 11/23/2004 Document Revised: 10/12/2015 Document Reviewed: 03/23/2015 °Elsevier Interactive Patient Education © 2018 [employer] ° ° ° °Post Anesthesia Home Care Instructions ° °Activity: °Get plenty of rest for the remainder of the day. A responsible individual must stay with you for 24 hours following the procedure.  °For the next 24 hours, DO NOT: °-Drive a car °-Operate machinery °-Drink alcoholic beverages °-Take any medication unless instructed by your physician °-Make any legal decisions or sign important papers. ° °Meals: °Start with liquid foods such as gelatin or soup. Progress to regular foods as tolerated. Avoid greasy, spicy, heavy foods. If nausea and/or vomiting occur, drink only clear liquids until the nausea and/or vomiting subsides. Call your physician if vomiting continues. ° °Special Instructions/Symptoms: °Your throat may feel dry or sore from the anesthesia or the breathing tube placed in your throat during surgery. If this causes discomfort, gargle with warm salt water. The discomfort should disappear within 24 hours. ° °If you had a scopolamine patch placed behind your ear for the management   of post- operative nausea and/or vomiting: ° °1. The medication in the patch is effective for 72 hours, after which it should be removed.  Wrap patch in a tissue and discard in the trash. Wash hands thoroughly with soap and water. °2. You may remove the patch earlier than 72 hours if you experience unpleasant  side effects which may include dry mouth, dizziness or visual disturbances. °3. Avoid touching the patch. Wash your hands with soap and water after contact with the patch. °  ° °

## 2017-05-23 NOTE — Transfer of Care (Signed)
Immediate Anesthesia Transfer of Care Note  Patient: TREYON WYMORE  Procedure(s) Performed: TRANSURETHRAL RESECTION OF BLADDER TUMOR / (TURBT) WITH INSTILLATION OF EPIRUBICIN (N/A )  Patient Location: PACU  Anesthesia Type:General  Level of Consciousness: drowsy and responds to stimulation  Airway & Oxygen Therapy: Patient Spontanous Breathing and Patient connected to nasal cannula oxygen  Post-op Assessment: Report given to RN  Post vital signs: Reviewed and stable  Last Vitals:  Vitals:   05/23/17 0656 05/23/17 1012  BP: (!) 159/88   Pulse: 91 96  Resp: 18   Temp: 36.9 C   SpO2: 94% 93%    Last Pain:  Vitals:   05/23/17 0656  TempSrc: Oral      Patients Stated Pain Goal: 5 (21/82/88 3374)  Complications: No apparent anesthesia complications

## 2017-05-23 NOTE — Op Note (Signed)
PATIENT:  Timothy Lee  PRE-OPERATIVE DIAGNOSIS: Bladder neoplasm of uncertain malignant potential  POST-OPERATIVE DIAGNOSIS:  Same  PROCEDURE:  Procedure(s): TRANSURETHRAL RESECTION OF BLADDER TUMOR (TURBT) (1.5 cm.)  SURGEON:  Surgeon(s): Georgette Dover, MD  ANESTHESIA:   general  EBL: Minimal  DRAINS: Urinary Catheter (16 Fr. Foley)   SPECIMEN:  Source of Specimen:  Bladder tumor-left bladder neck  DISPOSITION OF SPECIMEN:  PATHOLOGY  Findings: Papillary bladder tumor noted at the left bladder neck.  On exam under anesthesia the penis was circumcised and without mass or lesion.  Testicles descended bilaterally and palpably normal.  On digital rectal exam the prostate was small and benign.  No hard areas or nodules.  Description of procedure: The patient was taken to the operating room and administered general anesthesia. He was then placed on the table and moved to the dorsal lithotomy position after which his genitalia was sterilely prepped and draped. An official timeout was then performed to confirm the patient and procedure.  The cystoscope was passed per urethra and the bladder carefully inspected with a 30 degree and 70 degree lens.  The scope was removed.  The resectoscope would not pass.  The meatus required dilation to 61 [place] and then the 26 [place] resectoscope with Timberlake obturator was then introduced into the bladder and the obturator was removed. The resectoscope element with 12 lens was then inserted and the bladder was fully and systematically inspected. Ureteral orifices were noted to be in the normal anatomic positions.   The left bladder neck tumor was resected.  It appeared to be superficial and about 15 mm in maximal dimension.  This was resected and fulgurated and hemostasis excellent at low pressure. Reinspection of the bladder revealed all obvious tumor had been fully resected and there was no evidence of perforation.  The specimen was evacuated  from the bladder which were sent to pathology. I then removed the resectoscope.   Lidocaine jelly was instilled per urethra and a 16 French Foley catheter was then inserted in the bladder and irrigated. The irrigant returned clear.  An exam under anesthesia was performed and I placed a B&O suppository. He was awakened and taken to the recovery room in stable condition.    Instillation of epirubicin in PACU: I instilled 50 mg of epirubicin and 50 cc of normal saline into the bladder through the Foley and plugged the catheter.  This will remain indwelling for about 50 minutes.  It will then be drained from the bladder and the catheter will be removed and the patient discharged home.  PLAN OF CARE: Remove catheter and discharge to home after PACU  PATIENT DISPOSITION:  PACU - hemodynamically stable.

## 2017-05-23 NOTE — Anesthesia Preprocedure Evaluation (Signed)
Anesthesia Evaluation  Patient identified by MRN, date of birth, ID band Patient awake    Reviewed: Allergy & Precautions, NPO status , Patient's Chart, lab work & pertinent test results  Airway Mallampati: II  TM Distance: >3 FB Neck ROM: Full    Dental no notable dental hx.    Pulmonary sleep apnea , Current Smoker,    + rhonchi        Cardiovascular hypertension, Pt. on medications Normal cardiovascular exam Rhythm:Regular Rate:Normal     Neuro/Psych negative neurological ROS  negative psych ROS   GI/Hepatic Neg liver ROS,   Endo/Other  diabetes, Poorly Controlled  Renal/GU negative Renal ROS  negative genitourinary   Musculoskeletal negative musculoskeletal ROS (+)   Abdominal   Peds negative pediatric ROS (+)  Hematology negative hematology ROS (+)   Anesthesia Other Findings   Reproductive/Obstetrics negative OB ROS                             Anesthesia Physical Anesthesia Plan  ASA: III  Anesthesia Plan: General   Post-op Pain Management:    Induction: Intravenous  PONV Risk Score and Plan: 1 and Ondansetron and Treatment may vary due to age or medical condition  Airway Management Planned: LMA  Additional Equipment:   Intra-op Plan:   Post-operative Plan: Extubation in OR  Informed Consent: I have reviewed the patients History and Physical, chart, labs and discussed the procedure including the risks, benefits and alternatives for the proposed anesthesia with the patient or authorized representative who has indicated his/her understanding and acceptance.   Dental advisory given  Plan Discussed with: CRNA and Surgeon  Anesthesia Plan Comments:         Anesthesia Quick Evaluation

## 2017-05-23 NOTE — H&P (Signed)
Office Visit Report     05/22/2017   --------------------------------------------------------------------------------   Timothy Lee  MRN: [id number]  PRIMARY CARE:  Timothy Loser. Luan Pulling, MD  DOB: 10-04-1950, 67 year old Male  REFERRING:  [name]  SSN: [id number]  PROVIDER:  [name], [name]    TREATING:  [name]    LOCATION:  [facility] [phone]   --------------------------------------------------------------------------------   CC: I have bladder cancer that has been treated.  HPI: Timothy Lee is a 67 year-old male established patient who is here for follow-up of bladder cancer treatment.  August 2018:  October 2013 - low-grade TA disease, right posterior, muscle present and negative.   Last upper tract: July 2013-CT abdomen and pelvis was done with and without contrast at [facility] which was reported as normal apart from a 16 mm left renal cyst. He brought the disc. I review of the images. There was complete visualization of the ureters and collecting system.   He's overdue for follow-up but said he had a lot going on last year dealing with his mother who is now 5. He's had no gross hematuria or dysuria.   05/22/17: He is here today for pre operative appointment. He is scheduled for TURBT tomorrow. He denies any significant changes in his overall health since he was seen last. He denies any dysuria, gross hematuria, or fevers. No recent chest pain. He is not on blood thinners. He does have some shortness of breath on occasion.   His bladder cancer was superficial and limitied to the bladder lining. His bladder cancer was not muscle invasive. He had treatment with the following intravesical agents: mitomycin. Patient denies bcg, interferon, and adriamycin.   His last cysto was 07/12/2014. His last radiologic test to evaluate the kidneys was approximately 12/19/2011.     ALLERGIES: No Allergies    MEDICATIONS: Allergy TABS  Oral  Amlodipine Besylate     GU PSH: Bladder Instill AntiCA Agent - August 18, 2011 Cystoscopy - 12/26/2016 Cystoscopy TURBT <2 cm - 2011/08/18 Locm 300-399Mg /Ml Iodine,1Ml - 01/06/2017      PSH Notes: Bladder Injection Of Cancer Treatment, Cystoscopy With Fulguration Small Lesion (5-44mm), Uvulectomy, Tonsillectomy   NON-GU PSH: Excise Uvula - 08/18/11 Remove Tonsils - August 18, 2011    GU PMH: Bladder Cancer Lateral - 12/26/2016, Malignant neoplasm of lateral wall of urinary bladder, - 08/18/2014 Bladder tumor/neoplasm, Neoplasm of uncertain behavior of bladder - 2012-08-17 Disorder Kidney/ureter, Unspec, Acute Renal Disease - 08-17-2012 Encounter for Prostate Cancer screening, Prostate cancer screening - 08/17/2012 Hematuria, Unspec, Hematuria - 08-17-12 Renal cyst, Renal cyst, acquired - 08-17-2012      [place] Notes:  2012-01-15 13:03:01 - Note: Arthritis   NON-GU PMH: Encounter for general adult medical examination without abnormal findings, Encounter for preventive health examination - 2014/08/18 Gastric ulcer, unspecified as acute or chronic, without hemorrhage or perforation, Gastric Ulcer - August 17, 2012 Gout, Gout - August 17, 2012 Personal history of other diseases of the circulatory system, History of hypertension - 17-Aug-2012 Personal history of other diseases of the nervous system and sense organs, History of sleep apnea - 08-17-12 Personal history of other endocrine, nutritional and metabolic disease, History of diabetes mellitus - Aug 17, 2012    FAMILY HISTORY: Deceased - Father multiple sclerosis - Father   SOCIAL HISTORY: Marital Status: Married Preferred Language: English; Ethnicity: Not Hispanic Or Latino; Race: White Current Smoking Status: Patient smokes.   Tobacco Use Assessment Completed: Used Tobacco in last 30 days?    REVIEW OF SYSTEMS:  GU Review Male:   Patient reports erection problems. Patient denies frequent urination, hard to postpone urination, burning/ pain with urination, get up at night to urinate, leakage of urine, stream starts and stops,  trouble starting your stream, have to strain to urinate , and penile pain.  Gastrointestinal (Upper):   Patient denies nausea, vomiting, and indigestion/ heartburn.  Gastrointestinal (Lower):   Patient denies constipation and diarrhea.  Constitutional:   Patient denies fever, night sweats, weight loss, and fatigue.  Skin:   Patient denies skin rash/ lesion and itching.  Eyes:   Patient reports blurred vision. Patient denies double vision.  Ears/ Nose/ Throat:   Patient denies sore throat and sinus problems.  Hematologic/Lymphatic:   Patient reports easy bruising. Patient denies swollen glands.  Cardiovascular:   Patient denies leg swelling and chest pains.  Respiratory:   Patient reports cough and shortness of breath.   Endocrine:   Patient denies excessive thirst.  Musculoskeletal:   Patient reports back pain and joint pain.   Neurological:   Patient denies headaches and dizziness.  Psychologic:   Patient denies depression and anxiety.   VITAL SIGNS:      05/22/2017 11:52 AM  Weight 246 lb / 111.58 kg  BP 151/88 mmHg  Pulse 94 /min   MULTI-SYSTEM PHYSICAL EXAMINATION:    Constitutional: Obese. No physical deformities. Normally developed. Good grooming.   Respiratory: Normal breath sounds. No labored breathing, no use of accessory muscles.   Cardiovascular: Regular rate and rhythm. No murmur, no gallop. Normal temperature, no swelling, no varicosities.   Skin: No paleness, no jaundice, no cyanosis. No lesion, no ulcer, no rash.  Neurologic / Psychiatric: Oriented to time, oriented to place, oriented to person. No depression, no anxiety, no agitation.  Gastrointestinal: No mass, no tenderness, no rigidity, non obese abdomen.  Musculoskeletal: Spine, ribs, pelvis no bilateral tenderness. Normal gait and station of head and neck.     PAST DATA REVIEWED:  Source Of History:  Patient  Records Review:   Previous Patient Records  Urine Test Review:   Urinalysis   01/15/12  PSA  Total  PSA 1.88     PROCEDURES:          Urinalysis Dipstick Dipstick Cont'd  Color: Yellow Bilirubin: Neg  Appearance: Clear Ketones: Neg  Specific Gravity: 1.020 Blood: Neg  pH: 6.0 Protein: Neg  Glucose: 3+ Urobilinogen: 0.2    Nitrites: Neg    Leukocyte Esterase: Neg    ASSESSMENT:      ICD-10 Details  1 GU:   Bladder Cancer Lateral - C67.2    PLAN:           Orders Labs Urine Culture          Schedule Return Visit/Planned Activity: Keep Scheduled Appointment          Document Letter(s):  Created for Patient: Clinical Summary         Notes:   - UA today is clear, but I will send for culture given upcoming procedure.  - We again discussed upcoming procedure and I answered all of his questions to the best of my ability.  - He will keep scheduled follow up for surgery.     * Signed by [name] on 05/23/17 at 12:45 AM (EST)*     The information contained in this medical record document is considered private and confidential patient information. This information can only be used for the medical diagnosis and/or medical services that are being provided by  the patient's selected caregivers. This information can only be distributed outside of the patient's care if the patient agrees and signs waivers of authorization for this information to be sent to an outside source or route.

## 2017-05-26 ENCOUNTER — Encounter (HOSPITAL_BASED_OUTPATIENT_CLINIC_OR_DEPARTMENT_OTHER): Payer: Self-pay | Admitting: Urology

## 2017-06-02 DIAGNOSIS — C672 Malignant neoplasm of lateral wall of bladder: Secondary | ICD-10-CM | POA: Diagnosis not present

## 2017-08-06 DIAGNOSIS — Z87891 Personal history of nicotine dependence: Secondary | ICD-10-CM | POA: Diagnosis not present

## 2017-08-06 DIAGNOSIS — Z1339 Encounter for screening examination for other mental health and behavioral disorders: Secondary | ICD-10-CM | POA: Diagnosis not present

## 2017-08-06 DIAGNOSIS — R52 Pain, unspecified: Secondary | ICD-10-CM | POA: Diagnosis not present

## 2017-08-06 DIAGNOSIS — E119 Type 2 diabetes mellitus without complications: Secondary | ICD-10-CM | POA: Diagnosis not present

## 2017-08-06 DIAGNOSIS — F17200 Nicotine dependence, unspecified, uncomplicated: Secondary | ICD-10-CM | POA: Diagnosis not present

## 2017-08-06 DIAGNOSIS — Z131 Encounter for screening for diabetes mellitus: Secondary | ICD-10-CM | POA: Diagnosis not present

## 2017-08-06 DIAGNOSIS — J209 Acute bronchitis, unspecified: Secondary | ICD-10-CM | POA: Diagnosis not present

## 2017-08-06 DIAGNOSIS — Z0000 Encounter for general adult medical examination without abnormal findings: Secondary | ICD-10-CM | POA: Diagnosis not present

## 2017-08-06 DIAGNOSIS — Z125 Encounter for screening for malignant neoplasm of prostate: Secondary | ICD-10-CM | POA: Diagnosis not present

## 2017-08-19 ENCOUNTER — Ambulatory Visit (INDEPENDENT_AMBULATORY_CARE_PROVIDER_SITE_OTHER): Payer: Medicare Other | Admitting: Nurse Practitioner

## 2017-08-19 ENCOUNTER — Encounter: Payer: Self-pay | Admitting: Nurse Practitioner

## 2017-08-19 VITALS — BP 136/76 | HR 99 | Temp 98.7°F | Ht 72.0 in | Wt 246.0 lb

## 2017-08-19 DIAGNOSIS — F17200 Nicotine dependence, unspecified, uncomplicated: Secondary | ICD-10-CM | POA: Diagnosis not present

## 2017-08-19 DIAGNOSIS — R739 Hyperglycemia, unspecified: Secondary | ICD-10-CM

## 2017-08-19 DIAGNOSIS — I10 Essential (primary) hypertension: Secondary | ICD-10-CM

## 2017-08-19 DIAGNOSIS — Z8551 Personal history of malignant neoplasm of bladder: Secondary | ICD-10-CM | POA: Diagnosis not present

## 2017-08-19 DIAGNOSIS — J209 Acute bronchitis, unspecified: Secondary | ICD-10-CM | POA: Diagnosis not present

## 2017-08-19 DIAGNOSIS — Z720 Tobacco use: Secondary | ICD-10-CM | POA: Insufficient documentation

## 2017-08-19 DIAGNOSIS — G4733 Obstructive sleep apnea (adult) (pediatric): Secondary | ICD-10-CM

## 2017-08-19 MED ORDER — DOXYCYCLINE HYCLATE 100 MG PO TABS: 100.0000 mg | ORAL_TABLET | Freq: Two times a day (BID) | ORAL | 0 refills | Status: DC

## 2017-08-19 NOTE — Progress Notes (Signed)
Careteam: Patient Care Team: Sinda Du, MD as PCP - General (Pulmonary Disease) Melissa Montane, MD as Consulting Physician (Otolaryngology)  Advanced Directive information    No Known Allergies  Chief Complaint  Patient presents with  . Medical Management of Chronic Issues    Pt is being seen to establish care. Pt has not had a PCP in many years.      HPI: Patient is a 67 y.o. male seen in the office today to establish care and routine follow up.  Went to urgent care recently and had some blood work-went to urgent care due to cough, congestion, wheezing, cold symptoms ~ 3 weeks ago. Reports he got a shot but symptoms have persisted.   Hx of bladder cancer- 5 years ago had blood in his urine and found out it was bladder cancer, has had recurrence in January 2019, had another resection and chemo, continues to follow up with urologist.   Following with [facility] due to bursitis in bilateral shoulder and pain in bilateral hands- has not had any injection.  Takes tylenol or ibuprofen PRN  Was told before he was diabetic.   htn- taking amlodipine- benazepril mg- report he is getting this through Dr Luan Pulling office  but it has been ~10 years since he has seen him.   GERD- diet controlled, does not require medication  OSA- should be using a CPAP but does not, "aggravating"   Hx of skin cancers "all over"- goes to dermatologist every year. Unsure of name will let us know.   Was forced to get vaccine in the [employer] and unsure about vaccines. Does not get flu shot due to this.  Review of Systems:  Review of Systems  Constitutional: Negative for chills, fever and weight loss.  HENT: Positive for congestion. Negative for tinnitus.   Respiratory: Positive for cough, sputum production and shortness of breath.        Hx of smoking, sometimes symptoms are worse than other. Not currently issue  Cardiovascular: Negative for chest pain, palpitations and leg swelling.    Gastrointestinal: Negative for abdominal pain, constipation, diarrhea and heartburn.  Genitourinary: Negative for dysuria, frequency and urgency.  Musculoskeletal: Positive for joint pain and myalgias. Negative for back pain and falls.  Skin: Negative.   Neurological: Negative for dizziness and headaches.  Endo/Heme/Allergies: Positive for environmental allergies.  Psychiatric/Behavioral: Negative for depression and memory loss. The patient does not have insomnia.    Past Medical History:  Diagnosis Date  . Arthritis   . Bladder cancer [facility]) urologist-  dr Kipp Brood  . Borderline diabetes   . Bursitis of both shoulders   . Esophagus disorder    Constricted Esophagus, per New Patient Packet-PSC   . History of basal cell carcinoma (BCC) excision    05/ 2018  right forehead  . Hypertension   . Mild acid reflux no meds  . Nocturia   . OSA (obstructive sleep apnea)    per pt has not used cpap in years  . Productive cough    from smoking  . Sleep apnea    Per New Patient Packet-PSC   . Smokers' cough ([place])   . SOB (shortness of breath) on exertion    Past Surgical History:  Procedure Laterality Date  . CYSTOSCOPY WITH BIOPSY  03/13/2012   Procedure: CYSTOSCOPY WITH BIOPSY;  Surgeon: Fredricka Bonine, MD;  Location: [facility];  Service: Urology;  Laterality: N/A;     . ESOPHAGEAL DILATION  05/20/1986  Esophagus Stretch, per New Patient Packet-PSC   . TONSILLECTOMY  age 21  . TRANSURETHRAL RESECTION OF BLADDER TUMOR N/A 05/23/2017   Procedure: TRANSURETHRAL RESECTION OF BLADDER TUMOR / (TURBT) WITH INSTILLATION OF EPIRUBICIN;  Surgeon: [name], MD;  Location: [facility];  Service: Urology;  Laterality: N/A;  . UVULECTOMY  05/20/1990   Dr.Byers, per new patient packet   . UVULOPALATOPHARYNGOPLASTY  2005   osa   Social History:   reports that he has been smoking cigarettes.  He has a 23.50 pack-year smoking history. He has never  used smokeless tobacco. He reports that he drinks alcohol. He reports that he has current or past drug history. Drug: Marijuana.  Family History  Problem Relation Age of Onset  . High blood pressure Mother   . Diabetes Mother   . Dementia Mother   . Stroke Mother   . Arthritis Mother   . Multiple sclerosis Father   . Diabetes Father   . Diabetes Sister   . High blood pressure Sister   . ADD / ADHD Son   . Migraines Daughter   . ADD / ADHD Daughter   . Cancer Maternal Grandmother     Medications: Patient's Medications  New Prescriptions   No medications on file  Previous Medications   ACETAMINOPHEN (TYLENOL) 325 MG TABLET    Take 650 mg by mouth every 6 (six) hours as needed.   AMLODIPINE-BENAZEPRIL (LOTREL) 5-20 MG PER CAPSULE    Take 1 capsule by mouth every morning.    IBUPROFEN (ADVIL,MOTRIN) 200 MG TABLET    Take 200 mg by mouth every 6 (six) hours as needed.  Modified Medications   No medications on file  Discontinued Medications   No medications on file     Physical Exam:  Vitals:   08/19/17 0911 08/19/17 0956  BP: (!) 150/78 136/76  Pulse: 99   Temp: 98.7 F (37.1 C)   TempSrc: Oral   SpO2: 99%   Weight: 246 lb (111.6 kg)   Height: 6' (1.829 m)    Body mass index is 33.36 kg/m.  Physical Exam  Constitutional: He is oriented to person, place, and time. He appears well-developed and well-nourished. No distress.  HENT:  Head: Normocephalic and atraumatic.  Right Ear: External ear normal.  Left Ear: External ear normal.  Nose: Mucosal edema and rhinorrhea present.  Mouth/Throat: Oropharynx is clear and moist. No oropharyngeal exudate.  Eyes: Pupils are equal, round, and reactive to light. Conjunctivae and EOM are normal.  Neck: Normal range of motion. Neck supple.  Cardiovascular: Normal rate, regular rhythm and normal heart sounds.  Pulmonary/Chest: Effort normal. He has decreased breath sounds.  Abdominal: Soft. Bowel sounds are normal.   Musculoskeletal: He exhibits no edema.  Neurological: He is alert and oriented to person, place, and time.  Skin: Skin is warm and dry. He is not diaphoretic.  Psychiatric: He has a normal mood and affect.    Labs reviewed: Basic Metabolic Panel: Recent Labs    05/23/17 0745  NA 140  K 4.1  CL 100*  GLUCOSE 252*  BUN 19  CREATININE 0.70   Liver Function Tests: No results for input(s): AST, ALT, ALKPHOS, BILITOT, PROT, ALBUMIN in the last 8760 hours. No results for input(s): LIPASE, AMYLASE in the last 8760 hours. No results for input(s): AMMONIA in the last 8760 hours. CBC: Recent Labs    05/23/17 0745  HGB 16.7  HCT 49.0   Lipid Panel: No results for input(s):  CHOL, HDL, LDLCALC, TRIG, CHOLHDL, LDLDIRECT in the last 8760 hours. TSH: No results for input(s): TSH in the last 8760 hours. A1C: No results found for: HGBA1C   Assessment/Plan 1. Essential hypertension Improved on recheck, will continue current medication. DASH diet given - COMPLETE METABOLIC PANEL WITH GFR; Future - Lipid Panel; Future - CBC with Differential/Platelets; Future  2. Elevated blood sugar -dietary changes recommended, DASH diet given - COMPLETE METABOLIC PANEL WITH GFR; Future - Hemoglobin A1c; Future - Lipid Panel; Future  3. Acute bronchitis, unspecified organism -current smoker with hx of wheezing, shortness of breath and now with current cough and congestion ongoing for ~ 3 weeks. Also with nasal congestion -recommended Flonase nasal spray bilaterally twice daily -nasal saline PRN -mucinex DM by mouth twice daily for 7 days with full glass of water -increase hydration   - doxycycline (VIBRA-TABS) 100 MG tablet; Take 1 tablet (100 mg total) by mouth 2 (two) times daily.  Dispense: 14 tablet; Refill: 0 - Ambulatory referral to Pulmonology; Future  4. OSA (obstructive sleep apnea) Does not wear mask but willing to have this re-evaluated by pulmonary.  - Ambulatory referral to  Pulmonology; Future  5. Smoker -has attempted to cut back, previously smoking 1 ppd now down to 1/2 pack - Ambulatory referral to Pulmonology; Future  6. Hx of bladder cancer Followed by urology at this time.   Next appt: 09/12/2017  Carlos American. [place], [facility] [phone]

## 2017-08-19 NOTE — Patient Instructions (Addendum)
To use flonase twice daily to bilateral nares Can use plain nasal saline as needed throughout the day MUCINEX DM by mouth twice daily with full glass of water for 7 days for cough and congestion Doxycycline 100 mg by mouth twice daily for sinuses and bronchitis Increase hydration   Please obtain name of dermatologist   To follow up in 4 weeks for physical with fasting blood work prior to appt   DASH Eating Plan [place] stands for "Dietary Approaches to Stop Hypertension." The DASH eating plan is a healthy eating plan that has been shown to reduce high blood pressure (hypertension). It may also reduce your risk for type 2 diabetes, heart disease, and stroke. The DASH eating plan may also help with weight loss. What are tips for following this plan? General guidelines  Avoid eating more than 2,300 mg (milligrams) of salt (sodium) a day. If you have hypertension, you may need to reduce your sodium intake to 1,500 mg a day.  Limit alcohol intake to no more than 1 drink a day for nonpregnant women and 2 drinks a day for men. One drink equals 12 oz of beer, 5 oz of wine, or 1 oz of hard liquor.  Work with your health care provider to maintain a healthy body weight or to lose weight. Ask what an ideal weight is for you.  Get at least 30 minutes of exercise that causes your heart to beat faster (aerobic exercise) most days of the week. Activities may include walking, swimming, or biking.  Work with your health care provider or diet and nutrition specialist (dietitian) to adjust your eating plan to your individual calorie needs. Reading food labels  Check food labels for the amount of sodium per serving. Choose foods with less than 5 percent of the Daily Value of sodium. Generally, foods with less than 300 mg of sodium per serving fit into this eating plan.  To find whole grains, look for the word "whole" as the first word in the ingredient list. Shopping  Buy products labeled as "low-sodium" or  "no salt added."  Buy fresh foods. Avoid canned foods and premade or frozen meals. Cooking  Avoid adding salt when cooking. Use salt-free seasonings or herbs instead of table salt or sea salt. Check with your health care provider or pharmacist before using salt substitutes.  Do not fry foods. Cook foods using healthy methods such as baking, boiling, grilling, and broiling instead.  Cook with heart-healthy oils, such as olive, canola, soybean, or sunflower oil. Meal planning   Eat a balanced diet that includes: ? 5 or more servings of fruits and vegetables each day. At each meal, try to fill half of your plate with fruits and vegetables. ? Up to 6-8 servings of whole grains each day. ? Less than 6 oz of lean meat, poultry, or fish each day. A 3-oz serving of meat is about the same size as a deck of cards. One egg equals 1 oz. ? 2 servings of low-fat dairy each day. ? A serving of nuts, seeds, or beans 5 times each week. ? Heart-healthy fats. Healthy fats called Omega-3 fatty acids are found in foods such as flaxseeds and coldwater fish, like sardines, salmon, and mackerel.  Limit how much you eat of the following: ? Canned or prepackaged foods. ? Food that is high in trans fat, such as fried foods. ? Food that is high in saturated fat, such as fatty meat. ? Sweets, desserts, sugary drinks, and other foods with  added sugar. ? Full-fat dairy products.  Do not salt foods before eating.  Try to eat at least 2 vegetarian meals each week.  Eat more home-cooked food and less restaurant, buffet, and fast food.  When eating at a restaurant, ask that your food be prepared with less salt or no salt, if possible. What foods are recommended? The items listed may not be a complete list. Talk with your dietitian about what dietary choices are best for you. Grains Whole-grain or whole-wheat bread. Whole-grain or whole-wheat pasta. Brown rice. [place]. Bulgur. Whole-grain and low-sodium  cereals. Pita bread. Low-fat, low-sodium crackers. Whole-wheat flour tortillas. Vegetables Fresh or frozen vegetables (raw, steamed, roasted, or grilled). Low-sodium or reduced-sodium tomato and vegetable juice. Low-sodium or reduced-sodium tomato sauce and tomato paste. Low-sodium or reduced-sodium canned vegetables. Fruits All fresh, dried, or frozen fruit. Canned fruit in natural juice (without added sugar). Meat and other protein foods Skinless chicken or [place]. Ground chicken or [place]. Pork with fat trimmed off. Fish and seafood. Egg whites. Dried beans, peas, or lentils. Unsalted nuts, nut butters, and seeds. Unsalted canned beans. Lean cuts of beef with fat trimmed off. Low-sodium, lean deli meat. Dairy Low-fat (1%) or fat-free (skim) milk. Fat-free, low-fat, or reduced-fat cheeses. Nonfat, low-sodium ricotta or cottage cheese. Low-fat or nonfat yogurt. Low-fat, low-sodium cheese. Fats and oils Soft margarine without trans fats. Vegetable oil. Low-fat, reduced-fat, or light mayonnaise and salad dressings (reduced-sodium). Canola, safflower, olive, soybean, and sunflower oils. Avocado. Seasoning and other foods Herbs. Spices. Seasoning mixes without salt. Unsalted popcorn and pretzels. Fat-free sweets. What foods are not recommended? The items listed may not be a complete list. Talk with your dietitian about what dietary choices are best for you. Grains Baked goods made with fat, such as croissants, muffins, or some breads. Dry pasta or rice meal packs. Vegetables Creamed or fried vegetables. Vegetables in a cheese sauce. Regular canned vegetables (not low-sodium or reduced-sodium). Regular canned tomato sauce and paste (not low-sodium or reduced-sodium). Regular tomato and vegetable juice (not low-sodium or reduced-sodium). Angie Fava. Olives. Fruits Canned fruit in a light or heavy syrup. Fried fruit. Fruit in cream or butter sauce. Meat and other protein foods Fatty cuts of meat. Ribs.  Fried meat. Berniece Salines. Sausage. Bologna and other processed lunch meats. Salami. Fatback. Hotdogs. Bratwurst. Salted nuts and seeds. Canned beans with added salt. Canned or smoked fish. Whole eggs or egg yolks. Chicken or [place] with skin. Dairy Whole or 2% milk, cream, and half-and-half. Whole or full-fat cream cheese. Whole-fat or sweetened yogurt. Full-fat cheese. Nondairy creamers. Whipped toppings. Processed cheese and cheese spreads. Fats and oils Butter. Stick margarine. Lard. Shortening. Ghee. Bacon fat. Tropical oils, such as coconut, palm kernel, or palm oil. Seasoning and other foods Salted popcorn and pretzels. Onion salt, garlic salt, seasoned salt, table salt, and sea salt. Worcestershire sauce. Tartar sauce. Barbecue sauce. Teriyaki sauce. Soy sauce, including reduced-sodium. Steak sauce. Canned and packaged gravies. Fish sauce. Oyster sauce. Cocktail sauce. Horseradish that you find on the shelf. Ketchup. Mustard. Meat flavorings and tenderizers. Bouillon cubes. Hot sauce and Tabasco sauce. Premade or packaged marinades. Premade or packaged taco seasonings. Relishes. Regular salad dressings. Where to find more information:  National Heart, Lung, and [place]: https://wilson-eaton.com/  American Heart Association: www.heart.org Summary  The DASH eating plan is a healthy eating plan that has been shown to reduce high blood pressure (hypertension). It may also reduce your risk for type 2 diabetes, heart disease, and stroke.  With the DASH  eating plan, you should limit salt (sodium) intake to 2,300 mg a day. If you have hypertension, you may need to reduce your sodium intake to 1,500 mg a day.  When on the DASH eating plan, aim to eat more fresh fruits and vegetables, whole grains, lean proteins, low-fat dairy, and heart-healthy fats.  Work with your health care provider or diet and nutrition specialist (dietitian) to adjust your eating plan to your individual calorie needs. This  information is not intended to replace advice given to you by your health care provider. Make sure you discuss any questions you have with your health care provider. Document Released: 04/25/2011 Document Revised: 04/29/2016 Document Reviewed: 04/29/2016 Elsevier Interactive Patient Education  [employer].

## 2017-08-20 ENCOUNTER — Encounter: Payer: Self-pay | Admitting: Nurse Practitioner

## 2017-09-12 ENCOUNTER — Other Ambulatory Visit: Payer: BLUE CROSS/BLUE SHIELD | Admitting: Nurse Practitioner

## 2017-09-12 ENCOUNTER — Other Ambulatory Visit: Payer: Medicare Other

## 2017-09-12 DIAGNOSIS — I10 Essential (primary) hypertension: Secondary | ICD-10-CM | POA: Diagnosis not present

## 2017-09-12 DIAGNOSIS — R739 Hyperglycemia, unspecified: Secondary | ICD-10-CM

## 2017-09-12 LAB — COMPLETE METABOLIC PANEL WITH GFR
AG Ratio: 2 (calc) (ref 1.0–2.5)
ALT: 19 U/L (ref 9–46)
AST: 12 U/L (ref 10–35)
Albumin: 4.3 g/dL (ref 3.6–5.1)
Alkaline phosphatase (APISO): 66 U/L (ref 40–115)
BILIRUBIN TOTAL: 1.4 mg/dL — AB (ref 0.2–1.2)
BUN: 12 mg/dL (ref 7–25)
CALCIUM: 9.6 mg/dL (ref 8.6–10.3)
CHLORIDE: 103 mmol/L (ref 98–110)
CO2: 29 mmol/L (ref 20–32)
Creat: 0.8 mg/dL (ref 0.70–1.25)
GFR, Est African American: 108 mL/min/{1.73_m2} (ref >=60)
GFR, Est Non African American: 93 mL/min/{1.73_m2} (ref >=60)
Globulin: 2.2 g/dL (calc) (ref 1.9–3.7)
Glucose, Bld: 247 mg/dL — ABNORMAL HIGH (ref 65–99)
POTASSIUM: 4.1 mmol/L (ref 3.5–5.3)
Sodium: 140 mmol/L (ref 135–146)
Total Protein: 6.5 g/dL (ref 6.1–8.1)

## 2017-09-12 LAB — LIPID PANEL
Cholesterol: 151 mg/dL (ref <200)
HDL: 43 mg/dL (ref >40)
LDL CHOLESTEROL (CALC): 85 mg/dL
Non-HDL Cholesterol (Calc): 108 mg/dL (calc) (ref <130)
TRIGLYCERIDES: 133 mg/dL (ref <150)
Total CHOL/HDL Ratio: 3.5 (calc) (ref <5.0)

## 2017-09-12 LAB — CBC WITH DIFFERENTIAL/PLATELET
BASOS PCT: 0.3 %
Basophils Absolute: 23 cells/uL (ref 0–200)
EOS PCT: 6.3 %
Eosinophils Absolute: 479 cells/uL (ref 15–500)
HEMATOCRIT: 46.3 % (ref 38.5–50.0)
Hemoglobin: 15.9 g/dL (ref 13.2–17.1)
LYMPHS ABS: 1596 {cells}/uL (ref 850–3900)
MCH: 30.2 pg (ref 27.0–33.0)
MCHC: 34.3 g/dL (ref 32.0–36.0)
MCV: 87.9 fL (ref 80.0–100.0)
MPV: 10.5 fL (ref 7.5–12.5)
Monocytes Relative: 6.9 %
Neutro Abs: 4978 cells/uL (ref 1500–7800)
Neutrophils Relative %: 65.5 %
PLATELETS: 245 10*3/uL (ref 140–400)
RBC: 5.27 10*6/uL (ref 4.20–5.80)
RDW: 12.2 % (ref 11.0–15.0)
TOTAL LYMPHOCYTE: 21 %
WBC: 7.6 10*3/uL (ref 3.8–10.8)
WBCMIX: 524 {cells}/uL (ref 200–950)

## 2017-09-12 LAB — HEMOGLOBIN A1C
HEMOGLOBIN A1C: 9.8 %{Hb} — AB (ref <5.7)
Mean Plasma Glucose: 235 (calc)
eAG (mmol/L): 13 (calc)

## 2017-09-16 ENCOUNTER — Encounter: Payer: Self-pay | Admitting: Nurse Practitioner

## 2017-09-16 ENCOUNTER — Ambulatory Visit (INDEPENDENT_AMBULATORY_CARE_PROVIDER_SITE_OTHER): Payer: Medicare Other | Admitting: Nurse Practitioner

## 2017-09-16 VITALS — BP 132/84 | HR 107 | Temp 98.5°F | Ht 72.0 in | Wt 245.0 lb

## 2017-09-16 DIAGNOSIS — Z23 Encounter for immunization: Secondary | ICD-10-CM

## 2017-09-16 DIAGNOSIS — R000 Tachycardia, unspecified: Secondary | ICD-10-CM

## 2017-09-16 DIAGNOSIS — Z0000 Encounter for general adult medical examination without abnormal findings: Secondary | ICD-10-CM

## 2017-09-16 DIAGNOSIS — E1165 Type 2 diabetes mellitus with hyperglycemia: Secondary | ICD-10-CM | POA: Diagnosis not present

## 2017-09-16 DIAGNOSIS — I10 Essential (primary) hypertension: Secondary | ICD-10-CM | POA: Diagnosis not present

## 2017-09-16 MED ORDER — FREESTYLE LIBRE READER DEVI: 1.0000 | Freq: Every day | 0 refills | Status: DC

## 2017-09-16 MED ORDER — METFORMIN HCL 500 MG PO TABS: 1000.0000 mg | ORAL_TABLET | Freq: Two times a day (BID) | ORAL | 3 refills | Status: DC

## 2017-09-16 MED ORDER — FREESTYLE LIBRE 14 DAY SENSOR MISC: 1.0000 | 11 refills | Status: DC

## 2017-09-16 NOTE — Addendum Note (Signed)
Addended by: Lauree Chandler on: 09/16/2017 04:39 PM   Modules accepted: Level of Service

## 2017-09-16 NOTE — Progress Notes (Addendum)
Provider: Lauree Chandler, NP  Patient Care Team: Lauree Chandler, NP as PCP - General (Geriatric Medicine) Melissa Montane, MD as Consulting Physician (Otolaryngology) [name], MD as Consulting Physician (Urology)  Extended Emergency Contact Information Primary Emergency Contact: Bolton,Cynthia Address: [address]          [place], [place] [address] Johnnette Litter of [place] Phone: [phone] Work Phone: [phone] Mobile Phone: [phone] Relation: Spouse Secondary Emergency Contact: Lennox Laity Address: [address]           [place], [place] [address] Johnnette Litter of [place] Phone: [phone] Mobile Phone: [phone] Relation: Daughter Allergies  Allergen Reactions  . Chantix [Varenicline Tartrate]     Aggression    Code Status: FULL Goals of Care: Advanced Directive information Advanced Directives 05/23/2017  Does Patient Have a Medical Advance Directive? No  Would patient like information on creating a medical advance directive? No - Patient declined     Chief Complaint  Patient presents with  . Medical Management of Chronic Issues    Pt is being seen for a physical.     HPI: Patient is a 67 y.o. male seen in today for an annual wellness exam.   Pt has been hospitalization in the last year due to bladder cancer which he follows with urology for currently.    Depression screen PHQ 2/9 08/19/2017  Decreased Interest 0  Down, Depressed, Hopeless 0  PHQ - 2 Score 0    Fall Risk  08/19/2017  Falls in the past year? No   No flowsheet data found.   Health Maintenance  Topic Date Due  . Hepatitis C Screening  Jul 20, 1950  . TETANUS/TDAP  06/09/1970  . COLONOSCOPY  06/09/2001  . PNA vac Low Risk Adult (1 of 2 - PCV13) 06/09/2016  . INFLUENZA VACCINE  12/18/2017   Ophthalmology? Does not go, unsure of last visit. Used to wear glasses but now has not needed to see far away but needs reading glass   Diet? Does not follow  diet.  Exercise? none   Dentition: yearly- married to a dentist.    Past Medical History:  Diagnosis Date  . Arthritis   . Bladder cancer [facility]) urologist-  dr Kipp Brood  . Borderline diabetes   . Bursitis of both shoulders   . Diabetes mellitus without complication ([place])   . Disorder of male genital organs, unspecified   . Esophagus disorder    Constricted Esophagus, per New Patient Packet-PSC   . History of basal cell carcinoma (BCC) excision    05/ 2018  right forehead  . Hypertension   . Mild acid reflux no meds  . Nocturia   . OSA (obstructive sleep apnea)    per pt has not used cpap in years  . Productive cough    from smoking  . Renal cyst, left 11/2011   16 mm  . Sleep apnea    Per New Patient Packet-PSC   . Smokers' cough ([place])   . SOB (shortness of breath) on exertion     Past Surgical History:  Procedure Laterality Date  . CYSTOSCOPY WITH BIOPSY  03/13/2012   Procedure: CYSTOSCOPY WITH BIOPSY;  Surgeon: Fredricka Bonine, MD;  Location: [facility];  Service: Urology;  Laterality: N/A;     . ESOPHAGEAL DILATION  05/20/1986   Esophagus Stretch, per New Patient Packet-PSC   . TONSILLECTOMY  age 24  . TRANSURETHRAL RESECTION OF BLADDER TUMOR N/A 05/23/2017   Procedure: TRANSURETHRAL RESECTION OF  BLADDER TUMOR / (TURBT) WITH INSTILLATION OF EPIRUBICIN;  Surgeon: [name], MD;  Location: [facility];  Service: Urology;  Laterality: N/A;  . UVULECTOMY  05/20/1990   Dr.Byers, per new patient packet   . UVULOPALATOPHARYNGOPLASTY  2005   osa    Social History   Socioeconomic History  . Marital status: Married    Spouse name: Not on file  . Number of children: Not on file  . Years of education: Not on file  . Highest education level: Not on file  Occupational History  . Not on file  Social Needs  . Financial resource strain: Not on file  . Food insecurity:    Worry: Not on file    Inability: Not on file  .  Transportation needs:    Medical: Not on file    Non-medical: Not on file  Tobacco Use  . Smoking status: Current Every Day Smoker    Packs/day: 0.50    Years: 47.00    Pack years: 23.50    Types: Cigarettes  . Smokeless tobacco: Never Used  . Tobacco comment: cutting back  Substance and Sexual Activity  . Alcohol use: Yes    Comment: very seldom- rare  . Drug use: Yes    Types: Marijuana    Comment: seldom-rare  . Sexual activity: Not Currently  Lifestyle  . Physical activity:    Days per week: Not on file    Minutes per session: Not on file  . Stress: Not on file  Relationships  . Social connections:    Talks on phone: Not on file    Gets together: Not on file    Attends religious service: Not on file    Active member of club or organization: Not on file    Attends meetings of clubs or organizations: Not on file    Relationship status: Not on file  Other Topics Concern  . Not on file  Social History Narrative   Diet: None      Caffeine: Yes      Married, if yes what year:  Yes, 1990      Do you live in a house, apartment, assisted living, condo, trailer, ect: House, 3 stories, 4 persons      Pets: 2 dogs, 2 cats       Highest level of education: Some Facilities manager profession: Clinical cytogeneticist       Exercise: Some         Living Will: Yes   DNR: Yes   POA/HPOA: Yes      Functional Status:   Do you have difficulty bathing or dressing yourself? No   Do you have difficulty preparing food or eating? No   Do you have difficulty managing your medications? No   Do you have difficulty managing your finances? No   Do you have difficulty affording your medications? No    Family History  Problem Relation Age of Onset  . Diabetes Mother   . Dementia Mother   . Stroke Mother   . Arthritis Mother   . Hypertension Mother   . Multiple sclerosis Father   . Diabetes Father   . Diabetes Sister   . Hypertension Sister   . ADD / ADHD Son   . Migraines  Daughter   . ADD / ADHD Daughter   . Cancer Maternal Grandmother     Review of Systems:  Review of Systems  Constitutional: Negative for activity change, appetite change,  fatigue and unexpected weight change.  HENT: Negative for congestion and hearing loss.   Eyes: Negative.   Respiratory: Positive for cough and shortness of breath.   Cardiovascular: Negative for chest pain, palpitations and leg swelling.  Gastrointestinal: Negative for abdominal pain, constipation and diarrhea.  Genitourinary: Negative for difficulty urinating and dysuria.  Musculoskeletal: Negative for arthralgias and myalgias.  Skin: Negative for color change and wound.  Neurological: Negative for dizziness and weakness.  Psychiatric/Behavioral: Negative for agitation, behavioral problems and confusion.     Allergies as of 09/16/2017      Reactions   Chantix [varenicline Tartrate]    Aggression       Medication List        Accurate as of 09/16/17 11:04 AM. Always use your most recent med list.          acetaminophen 325 MG tablet Commonly known as:  TYLENOL Take 650 mg by mouth every 6 (six) hours as needed.   amLODipine-benazepril 5-20 MG capsule Commonly known as:  LOTREL Take 1 capsule by mouth every morning.   ibuprofen 200 MG tablet Commonly known as:  ADVIL,MOTRIN Take 200 mg by mouth every 6 (six) hours as needed.         Physical Exam: Vitals:   09/16/17 1105  BP: 132/84  Pulse: (!) 107  Temp: 98.5 F (36.9 C)  TempSrc: Oral  SpO2: 96%  Weight: 245 lb (111.1 kg)  Height: 6' (1.829 m)   Body mass index is 33.23 kg/m. Physical Exam  Constitutional: He is oriented to person, place, and time. He appears well-developed and well-nourished. No distress.  HENT:  Head: Normocephalic and atraumatic.  Right Ear: External ear normal.  Left Ear: External ear normal.  Nose: Mucosal edema and rhinorrhea present.  Mouth/Throat: Oropharynx is clear and moist. No oropharyngeal exudate.    Eyes: Pupils are equal, round, and reactive to light. Conjunctivae and EOM are normal. Scleral icterus is present.  Neck: Normal range of motion. Neck supple.  Cardiovascular: Normal rate, regular rhythm and normal heart sounds.  Pulmonary/Chest: Effort normal. He has decreased breath sounds.  Abdominal: Soft. Bowel sounds are normal.  Musculoskeletal: He exhibits no edema.  Neurological: He is alert and oriented to person, place, and time.  Skin: Skin is warm and dry. He is not diaphoretic.  Psychiatric: He has a normal mood and affect.    Labs reviewed: Basic Metabolic Panel: Recent Labs    12/26/16 05/23/17 0745 09/12/17 0835  NA  --  140 140  K  --  4.1 4.1  CL  --  100* 103  CO2  --   --  29  GLUCOSE  --  252* 247*  BUN 15 19 12   CREATININE 0.5* 0.70 0.80  CALCIUM  --   --  9.6   Liver Function Tests: Recent Labs    09/12/17 0835  AST 12  ALT 19  BILITOT 1.4*  PROT 6.5   No results for input(s): LIPASE, AMYLASE in the last 8760 hours. No results for input(s): AMMONIA in the last 8760 hours. CBC: Recent Labs    05/23/17 0745 09/12/17 0835  WBC  --  7.6  NEUTROABS  --  4,978  HGB 16.7 15.9  HCT 49.0 46.3  MCV  --  87.9  PLT  --  245   Lipid Panel: Recent Labs    09/12/17 0835  CHOL 151  HDL 43  LDLCALC 85  TRIG 133  CHOLHDL 3.5   Lab Results  Component Value Date   HGBA1C 9.8 (H) 09/12/2017    Procedures: No results found.  Assessment/Plan 1. Wellness examination Mr Bingman is doing well,  counseled regarding th prevention of dental and periodontal disease, diet, regular sustained exercise for at least 30 minutes 5 times per week, smoking cessation, tobacco use,  and recommended schedule for GI hemoccult testing, colonoscopy, cholesterol, thyroid and diabetes screening. -following with urology routinely -encourage to get routine eye exam -cologuard information submitted, willing to do this over colonoscopy at this time.   2. Essential  hypertension Controlled on current regimen - EKG 12-Lead- stable from previous noted, HR 99  3. Type 2 diabetes mellitus with hyperglycemia, without long-term current use of insulin (HCC) -Start metformin 500 mg by mouth daily for 1 week at breakfast then to increase to twice daily breakfast and supper x 1 week Then increase metformin 500 mg 2 tablets at breakfast and 1 tablet at supper for 1 week Then increase metformin 500 mg 2 tablets at breakfast and 2 tablets at supper for diabetes - metFORMIN (GLUCOPHAGE) 500 MG tablet; Take 2 tablets (1,000 mg total) by mouth 2 (two) times daily with a meal.  Dispense: 180 tablet; Refill: 3 - Amb ref to Medical Nutrition Therapy-MNT for dietary modifications -to check blood sugars before breakfast daily   4. Tachycardia - EKG 12-Lead  5. Need for pneumococcal vaccination - Pneumococcal conjugate vaccine 13-valent   Next appt: 6 weeks to follow up diabetes, will need to be scheduled for AWV as well.  Total time over 40 mins time greater than 50% of total time spent doing pt counseling and coordination of care.  Carlos American. [place], [facility] [phone]

## 2017-09-16 NOTE — Patient Instructions (Addendum)
Start metformin 500 mg by mouth daily for 1 week at breakfast then to increase to twice daily breakfast and supper x 1 week Then increase metformin 500 mg 2 tablets at breakfast and 1 tablet at supper for 1 week Then increase metformin 500 mg 2 tablets at breakfast and 2 tablets at supper for diabetes  Nutrition referral placed to help with dietary modification   To check fasting blood sugar three times weekly before breakfast (fasting)  Follow up in 6 weeks on diabetes    Health Maintenance, Male A healthy lifestyle and preventive care is important for your health and wellness. Ask your health care provider about what schedule of regular examinations is right for you. What should I know about weight and diet? Eat a Healthy Diet  Eat plenty of vegetables, fruits, whole grains, low-fat dairy products, and lean protein.  Do not eat a lot of foods high in solid fats, added sugars, or salt.  Maintain a Healthy Weight Regular exercise can help you achieve or maintain a healthy weight. You should:  Do at least 150 minutes of exercise each week. The exercise should increase your heart rate and make you sweat (moderate-intensity exercise).  Do strength-training exercises at least twice a week.  Watch Your Levels of Cholesterol and Blood Lipids  Have your blood tested for lipids and cholesterol every 5 years starting at 66 years of age. If you are at high risk for heart disease, you should start having your blood tested when you are 66 years old. You may need to have your cholesterol levels checked more often if: ? Your lipid or cholesterol levels are high. ? You are older than 66 years of age. ? You are at high risk for heart disease.  What should I know about cancer screening? Many types of cancers can be detected early and may often be prevented. Lung Cancer  You should be screened every year for lung cancer if: ? You are a current smoker who has smoked for at least 30 years. ? You  are a former smoker who has quit within the past 15 years.  Talk to your health care provider about your screening options, when you should start screening, and how often you should be screened.  Colorectal Cancer  Routine colorectal cancer screening usually begins at 66 years of age and should be repeated every 5-10 years until you are 66 years old. You may need to be screened more often if early forms of precancerous polyps or small growths are found. Your health care provider may recommend screening at an earlier age if you have risk factors for colon cancer.  Your health care provider may recommend using home test kits to check for hidden blood in the stool.  A small camera at the end of a tube can be used to examine your colon (sigmoidoscopy or colonoscopy). This checks for the earliest forms of colorectal cancer.  Prostate and Testicular Cancer  Depending on your age and overall health, your health care provider may do certain tests to screen for prostate and testicular cancer.  Talk to your health care provider about any symptoms or concerns you have about testicular or prostate cancer.  Skin Cancer  Check your skin from head to toe regularly.  Tell your health care provider about any new moles or changes in moles, especially if: ? There is a change in a mole's size, shape, or color. ? You have a mole that is larger than a pencil eraser.  Always use sunscreen. Apply sunscreen liberally and repeat throughout the day.  Protect yourself by wearing long sleeves, pants, a wide-brimmed hat, and sunglasses when outside.  What should I know about heart disease, diabetes, and high blood pressure?  If you are 67-50 years of age, have your blood pressure checked every 3-5 years. If you are 35 years of age or older, have your blood pressure checked every year. You should have your blood pressure measured twice-once when you are at a hospital or clinic, and once when you are not at a  hospital or clinic. Record the average of the two measurements. To check your blood pressure when you are not at a hospital or clinic, you can use: ? An automated blood pressure machine at a pharmacy. ? A home blood pressure monitor.  Talk to your health care provider about your target blood pressure.  If you are between 77-28 years old, ask your health care provider if you should take aspirin to prevent heart disease.  Have regular diabetes screenings by checking your fasting blood sugar level. ? If you are at a normal weight and have a low risk for diabetes, have this test once every three years after the age of 68. ? If you are overweight and have a high risk for diabetes, consider being tested at a younger age or more often.  A one-time screening for abdominal aortic aneurysm (AAA) by ultrasound is recommended for men aged 70-75 years who are current or former smokers. What should I know about preventing infection? Hepatitis B If you have a higher risk for hepatitis B, you should be screened for this virus. Talk with your health care provider to find out if you are at risk for hepatitis B infection. Hepatitis C Blood testing is recommended for:  Everyone born from [birth year] through 1964.  Anyone with known risk factors for hepatitis C.  Sexually Transmitted Diseases (STDs)  You should be screened each year for STDs including gonorrhea and chlamydia if: ? You are sexually active and are younger than 66 years of age. ? You are older than 66 years of age and your health care provider tells you that you are at risk for this type of infection. ? Your sexual activity has changed since you were last screened and you are at an increased risk for chlamydia or gonorrhea. Ask your health care provider if you are at risk.  Talk with your health care provider about whether you are at high risk of being infected with HIV. Your health care provider may recommend a prescription medicine to help prevent  HIV infection.  What else can I do?  Schedule regular health, dental, and eye exams.  Stay current with your vaccines (immunizations).  Do not use any tobacco products, such as cigarettes, chewing tobacco, and e-cigarettes. If you need help quitting, ask your health care provider.  Limit alcohol intake to no more than 2 drinks per day. One drink equals 12 ounces of beer, 5 ounces of wine, or 1 ounces of hard liquor.  Do not use street drugs.  Do not share needles.  Ask your health care provider for help if you need support or information about quitting drugs.  Tell your health care provider if you often feel depressed.  Tell your health care provider if you have ever been abused or do not feel safe at home. This information is not intended to replace advice given to you by your health care provider. Make sure you discuss any  questions you have with your health care provider. Document Released: 11/02/2007 Document Revised: 01/03/2016 Document Reviewed: 02/07/2015 Elsevier Interactive Patient Education  [employer].

## 2017-09-16 NOTE — ACP (Advance Care Planning) (Signed)
Discussed advanced directive and HCPOA, does not have paper work filled out, would like to remain FULL code but does not want to be kept alive with tubes/drains. Encouraged to complete advanced directives at this time.

## 2017-09-18 ENCOUNTER — Encounter: Payer: Self-pay | Admitting: Pulmonary Disease

## 2017-09-18 ENCOUNTER — Ambulatory Visit (INDEPENDENT_AMBULATORY_CARE_PROVIDER_SITE_OTHER): Payer: Medicare Other | Admitting: Pulmonary Disease

## 2017-09-18 ENCOUNTER — Ambulatory Visit (HOSPITAL_BASED_OUTPATIENT_CLINIC_OR_DEPARTMENT_OTHER)
Admission: RE | Admit: 2017-09-18 | Discharge: 2017-09-18 | Disposition: A | Discharge status: Home / Self Care | Payer: Medicare Other | Source: Ambulatory Visit | Attending: Pulmonary Disease | Admitting: Pulmonary Disease

## 2017-09-18 VITALS — BP 162/112 | HR 79 | Ht 72.0 in | Wt 146.0 lb

## 2017-09-18 DIAGNOSIS — Z720 Tobacco use: Secondary | ICD-10-CM | POA: Insufficient documentation

## 2017-09-18 DIAGNOSIS — G4733 Obstructive sleep apnea (adult) (pediatric): Secondary | ICD-10-CM

## 2017-09-18 DIAGNOSIS — R05 Cough: Secondary | ICD-10-CM

## 2017-09-18 DIAGNOSIS — R059 Cough, unspecified: Secondary | ICD-10-CM

## 2017-09-18 NOTE — Progress Notes (Signed)
[facility]  Chief Complaint  Patient presents with  . sleep consult    Pt use to be on cpap machine years ago, not using it now. Pt is not sleeping at nights, daytime sleepiness.     Vital signs: BP (!) 162/112 (BP Location: Left Arm, Cuff Size: Normal)   Pulse 79   Ht 6' (1.829 m)   Wt 146 lb (66.2 kg)   SpO2 95%   BMI 19.80 kg/m   History of Present Illness: Timothy Lee is a 67 y.o. male smoker with cough and sleep difficulties.  He was diagnosed with sleep apnea about 30 years ago.  He was tried on CPAP, but did tolerate this.  He then had surgery.  This only caused to be prone to aspirating.  He still snores.  He is a restless sleeper.  He gets sleepy during the day.  Epworth score is 3 out of 24.  He has smoked since age 27.  He now smokes 1/2 to 1 pack per day, and is trying to gradually decrease how much he smokes.  This is how he quit drinking alcohol.  He gets episodes of bronchitis about 1 or 2 times per year.  He will get cough with clear sputum, and wheezing at times.  He can get winded if he is lifting things.  He has trouble with seasonal allergies.  He hasn't had recent chest xray or breathing test.   Physical Exam:  General - pleasant Eyes - pupils reactive ENT - no sinus tenderness, no oral exudate, no LAN, raspy voice Cardiac - regular, no murmur Chest - faint wheeze right lower lung field that cleared with coughing Abd - soft, non tender Ext - no edema Skin - no rashes Neuro - normal strength Psych - normal mood  Discussion: He has cough with history of tobacco abuse.  He also reports some degree of dyspnea with activities.  I am concerned he could have obstructive lung disease.  He has snoring, sleep disruption, and daytime sleepiness.  He has prior history of sleep apnea.  He has a history of hypertension with elevated blood pressure today  I am concerned he could still have sleep  apnea.  Assessment/Plan:  Cough. - will arrange for chest xray and pulmonary function test  Tobacco abuse. - reviewed options to help with smoking cessation - he will try quitting on his own  Obstructive sleep apnea. - will arrange for home sleep study to assess current status and then determine if he needs to consider CPAP therapy again  Hypertension. - f/u with PCP   Patient Instructions  Will arrange for chest xray, pulmonary function test, and home sleep study  Will schedule follow up after testing reviewed    Chesley Mires, MD [place] 09/18/2017, 12:01 PM  Flow Sheet  Pulmonary tests:  Sleep tests:  Review of Systems: Constitutional: Positive for unexpected weight change. Negative for fever.  HENT: Negative for congestion, dental problem, ear pain, nosebleeds, postnasal drip, rhinorrhea, sinus pressure, sneezing, sore throat and trouble swallowing.   Eyes: Negative for redness and itching.  Respiratory: Positive for cough and shortness of breath. Negative for chest tightness and wheezing.   Cardiovascular: Negative for palpitations and leg swelling.  Gastrointestinal: Negative for nausea and vomiting.  Genitourinary: Negative for dysuria.  Musculoskeletal: Positive for joint swelling.  Skin: Negative for rash.  Allergic/Immunologic: Positive for environmental allergies. Negative for food allergies and immunocompromised state.  Neurological: Negative for  headaches.  Hematological: Does not bruise/bleed easily.  Psychiatric/Behavioral: Negative for dysphoric mood. The patient is not nervous/anxious.    Past Medical History: He  has a past medical history of Arthritis, Bladder cancer ([place]) (urologist-  dr Kipp Brood), Borderline diabetes, Bursitis of both shoulders, Diabetes mellitus without complication ([place]), Disorder of male genital organs, unspecified, Esophagus disorder, History of basal cell carcinoma (BCC) excision, Hypertension, Mild acid  reflux (no meds), Nocturia, OSA (obstructive sleep apnea), Productive cough, Renal cyst, left (11/2011), Sleep apnea, Smokers' cough ([place]), and SOB (shortness of breath) on exertion.  Past Surgical History: He  has a past surgical history that includes Uvulopalatopharyngoplasty (2005); Tonsillectomy (age 67); Cystoscopy with biopsy (03/13/2012); Transurethral resection of bladder tumor (N/A, 05/23/2017); Esophageal dilation (05/20/1986); and Uvulectomy (05/20/1990).  Family History: His family history includes ADD / ADHD in his daughter and son; Arthritis in his mother; Cancer in his maternal grandmother; Dementia in his mother; Diabetes in his father, mother, and sister; Hypertension in his mother and sister; Migraines in his daughter; Multiple sclerosis in his father; Stroke in his mother.  Social History: He  reports that he has been smoking cigarettes.  He has a 47.00 pack-year smoking history. He has never used smokeless tobacco. He reports that he drinks alcohol. He reports that he has current or past drug history. Drug: Marijuana.  Medications: Allergies as of 09/18/2017      Reactions   Chantix [varenicline Tartrate]    Aggression       Medication List        Accurate as of 09/18/17 12:01 PM. Always use your most recent med list.          acetaminophen 325 MG tablet Commonly known as:  TYLENOL Take 650 mg by mouth every 6 (six) hours as needed.   amLODipine-benazepril 5-20 MG capsule Commonly known as:  LOTREL Take 1 capsule by mouth every morning.   FREESTYLE LIBRE 14 DAY SENSOR Misc 1 Cartridge by Does not apply route every 14 (fourteen) days. E11.65   FREESTYLE LIBRE READER Devi 1 Device by Does not apply route daily. E11.65   ibuprofen 200 MG tablet Commonly known as:  ADVIL,MOTRIN Take 200 mg by mouth every 6 (six) hours as needed.   metFORMIN 500 MG tablet Commonly known as:  GLUCOPHAGE Take 2 tablets (1,000 mg total) by mouth 2 (two) times daily with a meal.

## 2017-09-18 NOTE — Patient Instructions (Signed)
Will arrange for chest xray, pulmonary function test, and home sleep study  Will schedule follow up after testing reviewed

## 2017-09-18 NOTE — Progress Notes (Signed)
   Subjective:    Patient ID: Timothy Lee, male    DOB: 1951-04-22, 67 y.o.   MRN: [id number]  HPI    Review of Systems  Constitutional: Positive for unexpected weight change. Negative for fever.  HENT: Negative for congestion, dental problem, ear pain, nosebleeds, postnasal drip, rhinorrhea, sinus pressure, sneezing, sore throat and trouble swallowing.   Eyes: Negative for redness and itching.  Respiratory: Positive for cough and shortness of breath. Negative for chest tightness and wheezing.   Cardiovascular: Negative for palpitations and leg swelling.  Gastrointestinal: Negative for nausea and vomiting.  Genitourinary: Negative for dysuria.  Musculoskeletal: Positive for joint swelling.  Skin: Negative for rash.  Allergic/Immunologic: Positive for environmental allergies. Negative for food allergies and immunocompromised state.  Neurological: Negative for headaches.  Hematological: Does not bruise/bleed easily.  Psychiatric/Behavioral: Negative for dysphoric mood. The patient is not nervous/anxious.        Objective:   Physical Exam        Assessment & Plan:

## 2017-09-19 ENCOUNTER — Telehealth: Payer: Self-pay | Admitting: Pulmonary Disease

## 2017-09-19 NOTE — Telephone Encounter (Signed)
Dg Chest 2 View  Result Date: 09/18/2017 CLINICAL DATA:  Cough, smoker, EXAM: CHEST - 2 VIEW COMPARISON:  None in PACs FINDINGS: The lungs are adequately inflated. The interstitial markings are coarse in the lingula. There is no alveolar infiltrate or pleural effusion. The heart and pulmonary vascularity are normal. There is tortuosity of the descending thoracic aorta. There is wedge compression of the body of T11. IMPRESSION: Subsegmental atelectasis or early interstitial pneumonia in the lingula. Underlying chronic bronchitic changes. Followup PA and lateral chest X-ray is recommended in 3-4 weeks following trial of antibiotic therapy to ensure resolution and exclude underlying malignancy. Electronically Signed   By: David  Martinique [name]   On: 09/18/2017 16:59     Please let him know that his CXR shows changes from history of smoking.  Will call back after review of his breathing test.

## 2017-09-22 NOTE — Telephone Encounter (Signed)
Left voice mail on machine for patient to return phone call back regarding results. X1  

## 2017-09-23 DIAGNOSIS — N5201 Erectile dysfunction due to arterial insufficiency: Secondary | ICD-10-CM | POA: Diagnosis not present

## 2017-09-23 DIAGNOSIS — C672 Malignant neoplasm of lateral wall of bladder: Secondary | ICD-10-CM | POA: Diagnosis not present

## 2017-09-23 NOTE — Telephone Encounter (Signed)
Called and spoke with patient. Advised him of VS response. Patient verbalized understanding. Nothing further needed.

## 2017-09-23 NOTE — Telephone Encounter (Signed)
Pt is calling back [phone]

## 2017-10-14 ENCOUNTER — Ambulatory Visit: Payer: BLUE CROSS/BLUE SHIELD | Admitting: Nurse Practitioner

## 2017-10-15 ENCOUNTER — Ambulatory Visit (INDEPENDENT_AMBULATORY_CARE_PROVIDER_SITE_OTHER): Payer: Medicare Other | Admitting: Nurse Practitioner

## 2017-10-15 ENCOUNTER — Encounter: Payer: Self-pay | Admitting: Nurse Practitioner

## 2017-10-15 VITALS — BP 134/78 | HR 90 | Temp 98.4°F | Ht 72.0 in | Wt 243.0 lb

## 2017-10-15 DIAGNOSIS — J209 Acute bronchitis, unspecified: Secondary | ICD-10-CM | POA: Diagnosis not present

## 2017-10-15 MED ORDER — BENZONATATE 100 MG PO CAPS: 100.0000 mg | ORAL_CAPSULE | Freq: Three times a day (TID) | ORAL | 0 refills | Status: DC | PRN

## 2017-10-15 MED ORDER — LEVOFLOXACIN 500 MG PO TABS: 500.0000 mg | ORAL_TABLET | Freq: Every day | ORAL | 0 refills | Status: DC

## 2017-10-15 NOTE — Progress Notes (Signed)
Careteam: Patient Care Team: Lauree Chandler, NP as PCP - General (Geriatric Medicine) Melissa Montane, MD as Consulting Physician (Otolaryngology) [name], MD as Consulting Physician (Urology)  Advanced Directive information Does Patient Have a Medical Advance Directive?: No  Allergies  Allergen Reactions  . Chantix [Varenicline Tartrate]     Aggression     Chief Complaint  Patient presents with  . Acute Visit    Pt has had a cough for over a week.      HPI: Patient is a 67 y.o. male seen in the office today due to persist cough. Has been having burning in his chest and coughing and hacking. Been going on for a week and a half.  Reports no increase in wheezing or shortness of breath.  Green sputum.  Waking him up coughing. Taking mucinex and nyquil.   Went to pulmonologist and was instructed to get  PFT and sleep study but he has not had a chance to do any of these because he has been on the road.   Has cut back to half a pack of cigarettes and planning to cut back 2 more   Recently on doxycycline 100 mg by mouth twice daily for bronchitis in early April.   Review of Systems:  Review of Systems  Constitutional: Negative for chills, fever and malaise/fatigue.  HENT: Positive for congestion and sore throat (with postnasal drip). Negative for ear discharge, ear pain and sinus pain.   Respiratory: Positive for cough, sputum production, shortness of breath and wheezing.   Cardiovascular: Negative for chest pain and palpitations.  Neurological: Negative for dizziness and headaches.  Endo/Heme/Allergies: Positive for environmental allergies.    Past Medical History:  Diagnosis Date  . Arthritis   . Bladder cancer [facility]) urologist-  dr Kipp Brood  . Borderline diabetes   . Bursitis of both shoulders   . Diabetes mellitus without complication ([place])   . Disorder of male genital organs, unspecified   . Esophagus disorder    Constricted Esophagus, per New Patient  Packet-PSC   . History of basal cell carcinoma (BCC) excision    05/ 2018  right forehead  . Hypertension   . Mild acid reflux no meds  . Nocturia   . OSA (obstructive sleep apnea)    per pt has not used cpap in years  . Productive cough    from smoking  . Renal cyst, left 11/2011   16 mm  . Sleep apnea    Per New Patient Packet-PSC   . Smokers' cough ([place])   . SOB (shortness of breath) on exertion    Past Surgical History:  Procedure Laterality Date  . CYSTOSCOPY WITH BIOPSY  03/13/2012   Procedure: CYSTOSCOPY WITH BIOPSY;  Surgeon: Fredricka Bonine, MD;  [facility];  Service: Urology;  Laterality: N/A;     . ESOPHAGEAL DILATION  05/20/1986   Esophagus Stretch, per New Patient Packet-PSC   . TONSILLECTOMY  age 29  . TRANSURETHRAL RESECTION OF BLADDER TUMOR N/A 05/23/2017   Procedure: TRANSURETHRAL RESECTION OF BLADDER TUMOR / (TURBT) WITH INSTILLATION OF EPIRUBICIN;  Surgeon: [name], MD;  Location: [facility];  Service: Urology;  Laterality: N/A;  . UVULECTOMY  05/20/1990   Dr.Byers, per new patient packet   . UVULOPALATOPHARYNGOPLASTY  2005   osa   Social History:   reports that he has been smoking cigarettes.  He has a 47.00 pack-year smoking history. He has never used smokeless tobacco. He  reports that he drinks alcohol. He reports that he has current or past drug history. Drug: Marijuana.  Family History  Problem Relation Age of Onset  . Diabetes Mother   . Dementia Mother   . Stroke Mother   . Arthritis Mother   . Hypertension Mother   . Multiple sclerosis Father   . Diabetes Father   . Diabetes Sister   . Hypertension Sister   . ADD / ADHD Son   . Migraines Daughter   . ADD / ADHD Daughter   . Cancer Maternal Grandmother     Medications: Patient's Medications  New Prescriptions   No medications on file  Previous Medications   ACETAMINOPHEN (TYLENOL) 325 MG TABLET    Take 650 mg by mouth every 6  (six) hours as needed.   AMLODIPINE-BENAZEPRIL (LOTREL) 5-20 MG PER CAPSULE    Take 1 capsule by mouth every morning.    CONTINUOUS BLOOD GLUC RECEIVER (FREESTYLE LIBRE READER) DEVI    1 Device by Does not apply route daily. E11.65   CONTINUOUS BLOOD GLUC SENSOR (FREESTYLE LIBRE 14 DAY SENSOR) MISC    1 Cartridge by Does not apply route every 14 (fourteen) days. E11.65   IBUPROFEN (ADVIL,MOTRIN) 200 MG TABLET    Take 200 mg by mouth every 6 (six) hours as needed.   METFORMIN (GLUCOPHAGE) 500 MG TABLET    Take 2 tablets (1,000 mg total) by mouth 2 (two) times daily with a meal.  Modified Medications   No medications on file  Discontinued Medications   No medications on file     Physical Exam:  Vitals:   10/15/17 1136  BP: 134/78  Pulse: 90  Temp: 98.4 F (36.9 C)  TempSrc: Oral  SpO2: 96%  Weight: 243 lb (110.2 kg)  Height: 6' (1.829 m)   Body mass index is 32.96 kg/m.  Physical Exam  Constitutional: He is oriented to person, place, and time. He appears well-developed and well-nourished. No distress.  HENT:  Head: Normocephalic and atraumatic.  Right Ear: External ear normal.  Left Ear: External ear normal.  Nose: Mucosal edema and rhinorrhea present.  Mouth/Throat: Oropharynx is clear and moist. No oropharyngeal exudate.  Eyes: Pupils are equal, round, and reactive to light. Conjunctivae and EOM are normal.  Neck: Normal range of motion. Neck supple.  Cardiovascular: Normal rate, regular rhythm and normal heart sounds.  Pulmonary/Chest: Effort normal. He has decreased breath sounds.  Abdominal: Soft. Bowel sounds are normal.  Musculoskeletal: He exhibits no edema.  Neurological: He is alert and oriented to person, place, and time.  Skin: Skin is warm and dry. He is not diaphoretic.  Psychiatric: He has a normal mood and affect.    Labs reviewed: Basic Metabolic Panel: Recent Labs    12/26/16 05/23/17 0745 09/12/17 0835  NA  --  140 140  K  --  4.1 4.1  CL  --   100* 103  CO2  --   --  29  GLUCOSE  --  252* 247*  BUN 15 19 12   CREATININE 0.5* 0.70 0.80  CALCIUM  --   --  9.6   Liver Function Tests: Recent Labs    09/12/17 0835  AST 12  ALT 19  BILITOT 1.4*  PROT 6.5   No results for input(s): LIPASE, AMYLASE in the last 8760 hours. No results for input(s): AMMONIA in the last 8760 hours. CBC: Recent Labs    05/23/17 0745 09/12/17 0835  WBC  --  7.6  NEUTROABS  --  4,978  HGB 16.7 15.9  HCT 49.0 46.3  MCV  --  87.9  PLT  --  245   Lipid Panel: Recent Labs    09/12/17 0835  CHOL 151  HDL 43  LDLCALC 85  TRIG 133  CHOLHDL 3.5   TSH: No results for input(s): TSH in the last 8760 hours. A1C: Lab Results  Component Value Date   HGBA1C 9.8 (H) 09/12/2017     Assessment/Plan 1. Acute bronchitis, unspecified organism -smoking cessation encouraged.  -To use mucinex DM by mouth twice daily with full glass of water for 7 days -increase hydration - levofloxacin (LEVAQUIN) 500 MG tablet; Take 1 tablet (500 mg total) by mouth daily.  Dispense: 7 tablet; Refill: 0 - benzonatate (TESSALON) 100 MG capsule; Take 1 capsule (100 mg total) by mouth every 8 (eight) hours as needed for cough.  Dispense: 20 capsule; Refill: 0  Return precautions discussed  Carlos American. [place], [facility] [phone]

## 2017-10-15 NOTE — Patient Instructions (Addendum)
To start levaquin by mouth daily for bronchitis To use mucinex DM by mouth twice daily with full glass of water for 7 days  Can use benzonatate 100 mg by mouth every 8 hours as needed cough Continue to cut back on smoking    Acute Bronchitis, Adult Acute bronchitis is when air tubes (bronchi) in the lungs suddenly get swollen. The condition can make it hard to breathe. It can also cause these symptoms:  A cough.  Coughing up clear, yellow, or green mucus.  Wheezing.  Chest congestion.  Shortness of breath.  A fever.  Body aches.  Chills.  A sore throat.  Follow these instructions at home: Medicines  Take over-the-counter and prescription medicines only as told by your doctor.  If you were prescribed an antibiotic medicine, take it as told by your doctor. Do not stop taking the antibiotic even if you start to feel better. General instructions  Rest.  Drink enough fluids to keep your pee (urine) clear or pale yellow.  Avoid smoking and secondhand smoke. If you smoke and you need help quitting, ask your doctor. Quitting will help your lungs heal faster.  Use an inhaler, cool mist vaporizer, or humidifier as told by your doctor.  Keep all follow-up visits as told by your doctor. This is important. How is this prevented? To lower your risk of getting this condition again:  Wash your hands often with soap and water. If you cannot use soap and water, use hand sanitizer.  Avoid contact with people who have cold symptoms.  Try not to touch your hands to your mouth, nose, or eyes.  Make sure to get the flu shot every year.  Contact a doctor if:  Your symptoms do not get better in 2 weeks. Get help right away if:  You cough up blood.  You have chest pain.  You have very bad shortness of breath.  You become dehydrated.  You faint (pass out) or keep feeling like you are going to pass out.  You keep throwing up (vomiting).  You have a very bad  headache.  Your fever or chills gets worse. This information is not intended to replace advice given to you by your health care provider. Make sure you discuss any questions you have with your health care provider. Document Released: 10/23/2007 Document Revised: 12/13/2015 Document Reviewed: 10/25/2015 Elsevier Interactive Patient Education  [employer].

## 2017-10-28 ENCOUNTER — Telehealth: Payer: Self-pay | Admitting: Pulmonary Disease

## 2017-10-28 NOTE — Telephone Encounter (Signed)
Left voice mail on machine for patient to return phone call back regarding [facility] needing to schedule HST. X4  rec'd a staff message today from Rodena Piety in [facility] stating she has tried 3 different time to contact this patient to schedule the home sleep study. She have left voice mails on 09/29/2017, 10/08/2017 and 10/17/2017 and to this day I have not gotten a return phone call from the patient.  Mailed letter for patient to contact the office to schedule HST. Nothing further needed at this time.

## 2017-11-04 ENCOUNTER — Encounter: Payer: Self-pay | Admitting: Nurse Practitioner

## 2017-11-04 ENCOUNTER — Ambulatory Visit (INDEPENDENT_AMBULATORY_CARE_PROVIDER_SITE_OTHER): Payer: Medicare Other | Admitting: Nurse Practitioner

## 2017-11-04 VITALS — BP 142/90 | HR 95 | Temp 98.6°F | Ht 72.0 in | Wt 246.0 lb

## 2017-11-04 DIAGNOSIS — I10 Essential (primary) hypertension: Secondary | ICD-10-CM | POA: Diagnosis not present

## 2017-11-04 DIAGNOSIS — E1165 Type 2 diabetes mellitus with hyperglycemia: Secondary | ICD-10-CM | POA: Diagnosis not present

## 2017-11-04 MED ORDER — ONETOUCH ULTRA 2 W/DEVICE KIT: 1.0000 | PACK | Freq: Every day | 0 refills | Status: DC

## 2017-11-04 MED ORDER — METFORMIN HCL 1000 MG PO TABS: 1000.0000 mg | ORAL_TABLET | Freq: Two times a day (BID) | ORAL | 6 refills | Status: DC

## 2017-11-04 MED ORDER — GLUCOSE BLOOD VI STRP: 1.0000 | ORAL_STRIP | Freq: Every day | 11 refills | Status: DC

## 2017-11-04 NOTE — Progress Notes (Signed)
Careteam: Patient Care Team: Lauree Chandler, NP as PCP - General (Geriatric Medicine) Melissa Montane, MD as Consulting Physician (Otolaryngology) [name], MD as Consulting Physician (Urology)  Advanced Directive information Does Patient Have a Medical Advance Directive?: No  Allergies  Allergen Reactions  . Chantix [Varenicline Tartrate]     Aggression     Chief Complaint  Patient presents with  . Follow-up    Pt is being seen for a 6 week follow up on diabetes.      HPI: Patient is a 67 y.o. male seen in the office today to follow up diabetes. Pt was started on metformin after A1c on 09/12/17 was 9.8.  He was titrated up to metformin 1000 mg by mouth twice daily and instructed to take blood sugars before breakfast daily but has not been checking. Did not get meter due to cost of continuous monitoring system being too expensive, therefore looked for one he thought he had at home but it was old and unable to use. Had [place] send one into pharmacy but has not gone to get it yet.  No signs of hypoglycemia.  Has a whole list of things to do, sleep study, nutritionist, cologuard, etc.  Has some time off so planning to get things done.   Has been taking metformin and has titrated up to 1000 mg by mouth twice daily. No diarrhea or adverse effects noted, no upset stomach.   Still smoking but cutting back and continues to do so.   Blood pressure elevated, but lots of stress this morning, coffee, just took medication, running late, smoked.  Review of Systems:  Review of Systems  Constitutional: Negative for chills, fever and weight loss.  HENT: Positive for congestion. Negative for tinnitus.   Respiratory: Positive for cough and sputum production. Negative for shortness of breath.        Hx of smoking, sometimes symptoms are worse than other. Not currently issue  Cardiovascular: Negative for chest pain, palpitations and leg swelling.  Gastrointestinal: Negative for abdominal  pain, constipation, diarrhea and heartburn.  Genitourinary: Negative for dysuria, frequency and urgency.  Musculoskeletal: Positive for myalgias. Negative for back pain and falls.  Skin: Negative.   Neurological: Negative for dizziness and headaches.  Endo/Heme/Allergies: Positive for environmental allergies.  Psychiatric/Behavioral: Negative for depression and memory loss. The patient does not have insomnia.     Past Medical History:  Diagnosis Date  . Arthritis   . Bladder cancer [facility]) urologist-  dr Kipp Brood  . Borderline diabetes   . Bursitis of both shoulders   . Diabetes mellitus without complication ([place])   . Disorder of male genital organs, unspecified   . Esophagus disorder    Constricted Esophagus, per New Patient Packet-PSC   . History of basal cell carcinoma (BCC) excision    05/ 2018  right forehead  . Hypertension   . Mild acid reflux no meds  . Nocturia   . OSA (obstructive sleep apnea)    per pt has not used cpap in years  . Productive cough    from smoking  . Renal cyst, left 11/2011   16 mm  . Sleep apnea    Per New Patient Packet-PSC   . Smokers' cough ([place])   . SOB (shortness of breath) on exertion    Past Surgical History:  Procedure Laterality Date  . CYSTOSCOPY WITH BIOPSY  03/13/2012   Procedure: CYSTOSCOPY WITH BIOPSY;  Surgeon: Fredricka Bonine, MD;  Location[facility];  Service: Urology;  Laterality: N/A;     . ESOPHAGEAL DILATION  05/20/1986   Esophagus Stretch, per New Patient Packet-PSC   . TONSILLECTOMY  age 71  . TRANSURETHRAL RESECTION OF BLADDER TUMOR N/A 05/23/2017   Procedure: TRANSURETHRAL RESECTION OF BLADDER TUMOR / (TURBT) WITH INSTILLATION OF EPIRUBICIN;  Surgeon: [name], MD;  Location: [facility];  Service: Urology;  Laterality: N/A;  . UVULECTOMY  05/20/1990   Dr.Byers, per new patient packet   . UVULOPALATOPHARYNGOPLASTY  2005   osa   Social History:   reports that he has  been smoking cigarettes.  He has a 47.00 pack-year smoking history. He has never used smokeless tobacco. He reports that he drinks alcohol. He reports that he has current or past drug history. Drug: Marijuana.  Family History  Problem Relation Age of Onset  . Diabetes Mother   . Dementia Mother   . Stroke Mother   . Arthritis Mother   . Hypertension Mother   . Multiple sclerosis Father   . Diabetes Father   . Diabetes Sister   . Hypertension Sister   . ADD / ADHD Son   . Migraines Daughter   . ADD / ADHD Daughter   . Cancer Maternal Grandmother     Medications: Patient's Medications  New Prescriptions   No medications on file  Previous Medications   ACETAMINOPHEN (TYLENOL) 325 MG TABLET    Take 650 mg by mouth every 6 (six) hours as needed.   AMLODIPINE-BENAZEPRIL (LOTREL) 5-20 MG PER CAPSULE    Take 1 capsule by mouth every morning.    IBUPROFEN (ADVIL,MOTRIN) 200 MG TABLET    Take 200 mg by mouth every 6 (six) hours as needed.   METFORMIN (GLUCOPHAGE) 500 MG TABLET    Take 2 tablets (1,000 mg total) by mouth 2 (two) times daily with a meal.  Modified Medications   No medications on file  Discontinued Medications   BENZONATATE (TESSALON) 100 MG CAPSULE    Take 1 capsule (100 mg total) by mouth every 8 (eight) hours as needed for cough.   CONTINUOUS BLOOD GLUC RECEIVER (FREESTYLE LIBRE READER) DEVI    1 Device by Does not apply route daily. E11.65   CONTINUOUS BLOOD GLUC SENSOR (FREESTYLE LIBRE 14 DAY SENSOR) MISC    1 Cartridge by Does not apply route every 14 (fourteen) days. E11.65   LEVOFLOXACIN (LEVAQUIN) 500 MG TABLET    Take 1 tablet (500 mg total) by mouth daily.     Physical Exam:  Vitals:   11/04/17 1028  BP: (!) 142/90  Pulse: 95  Temp: 98.6 F (37 C)  TempSrc: Oral  SpO2: 95%  Weight: 246 lb (111.6 kg)  Height: 6' (1.829 m)   Body mass index is 33.36 kg/m.  Physical Exam  Constitutional: He is oriented to person, place, and time. He appears  well-developed and well-nourished. No distress.  HENT:  Head: Normocephalic and atraumatic.  Mouth/Throat: No oropharyngeal exudate.  Neck: Normal range of motion.  Cardiovascular: Normal rate, regular rhythm and normal heart sounds.  Pulmonary/Chest: Effort normal. He has decreased breath sounds.  Musculoskeletal: He exhibits no edema.  Neurological: He is alert and oriented to person, place, and time.  Skin: Skin is warm and dry. He is not diaphoretic.  Psychiatric: He has a normal mood and affect.    Labs reviewed: Basic Metabolic Panel: Recent Labs    12/26/16 05/23/17 0745 09/12/17 0835  NA  --  140 140  K  --  4.1 4.1  CL  --  100* 103  CO2  --   --  29  GLUCOSE  --  252* 247*  BUN 15 19 12   CREATININE 0.5* 0.70 0.80  CALCIUM  --   --  9.6   Liver Function Tests: Recent Labs    09/12/17 0835  AST 12  ALT 19  BILITOT 1.4*  PROT 6.5   No results for input(s): LIPASE, AMYLASE in the last 8760 hours. No results for input(s): AMMONIA in the last 8760 hours. CBC: Recent Labs    05/23/17 0745 09/12/17 0835  WBC  --  7.6  NEUTROABS  --  4,978  HGB 16.7 15.9  HCT 49.0 46.3  MCV  --  87.9  PLT  --  245   Lipid Panel: Recent Labs    09/12/17 0835  CHOL 151  HDL 43  LDLCALC 85  TRIG 133  CHOLHDL 3.5   TSH: No results for input(s): TSH in the last 8760 hours. A1C: Lab Results  Component Value Date   HGBA1C 9.8 (H) 09/12/2017     Assessment/Plan 1. Type 2 diabetes mellitus with hyperglycemia, without long-term current use of insulin (HCC) Started metformin without issues, has not been taking blood sugars but plans to get meter today.  -will cont current medication and continue to work on dietary changes.  - metFORMIN (GLUCOPHAGE) 1000 MG tablet; Take 1 tablet (1,000 mg total) by mouth 2 (two) times daily with a meal.  Dispense: 60 tablet; Refill: 6 - Hemoglobin A1c; Future - COMPLETE METABOLIC PANEL WITH GFR; Future  2. Essential  hypertension Elevated today however was rushing to get here, has had caffeine, cigarette  and just took medication. To continue current regimen, low sodium diet.   Next appt: to follow up in 2 months with lab work prior to appt. To bring blood sugar readings to next visit.  Timothy Lee. [place], [facility] [phone]

## 2017-11-04 NOTE — Patient Instructions (Signed)
Follow up with Dr Eulas Post in 2 months with lab work BEFORE visit  (do not have to be fasting labs)  Continue to work on diet.  Check blood sugars fasting daily

## 2017-11-18 ENCOUNTER — Telehealth: Payer: Self-pay | Admitting: *Deleted

## 2017-11-18 NOTE — Telephone Encounter (Signed)
Message left for patient to return and advise on Cologuard completion. Notice received that it has not been completed. Ordered in April.

## 2017-11-21 NOTE — Telephone Encounter (Signed)
Spoke with patient. He said he would try to get test completed ASAP.

## 2017-12-24 DIAGNOSIS — Z1212 Encounter for screening for malignant neoplasm of rectum: Secondary | ICD-10-CM | POA: Diagnosis not present

## 2017-12-24 DIAGNOSIS — Z1211 Encounter for screening for malignant neoplasm of colon: Secondary | ICD-10-CM | POA: Diagnosis not present

## 2017-12-24 LAB — COLOGUARD: COLOGUARD: NEGATIVE

## 2018-01-01 ENCOUNTER — Other Ambulatory Visit: Payer: Medicare Other

## 2018-01-07 ENCOUNTER — Ambulatory Visit: Payer: Medicare Other | Admitting: Internal Medicine

## 2018-01-13 ENCOUNTER — Encounter: Payer: Self-pay | Admitting: *Deleted

## 2018-01-14 ENCOUNTER — Telehealth: Payer: Self-pay

## 2018-01-14 NOTE — Telephone Encounter (Signed)
Incoming fax received from [facility]   Per Dr.Carter- Negative for increased colon cancer risk  Left message on voicemail for patient to return call when available

## 2018-01-22 ENCOUNTER — Ambulatory Visit (INDEPENDENT_AMBULATORY_CARE_PROVIDER_SITE_OTHER): Payer: Medicare Other | Admitting: Family

## 2018-01-22 ENCOUNTER — Encounter: Payer: Self-pay | Admitting: Family

## 2018-01-22 VITALS — BP 142/90 | HR 100 | Temp 98.5°F | Ht 72.0 in | Wt 243.0 lb

## 2018-01-22 DIAGNOSIS — B029 Zoster without complications: Secondary | ICD-10-CM | POA: Diagnosis not present

## 2018-01-22 MED ORDER — VALACYCLOVIR HCL 1 G PO TABS: 1000.0000 mg | ORAL_TABLET | Freq: Three times a day (TID) | ORAL | 0 refills | Status: DC

## 2018-01-22 NOTE — Progress Notes (Signed)
Provider: Lyman Balingit FNP-C  Lauree Chandler, NP  Patient Care Team: Lauree Chandler, NP as PCP - General (Geriatric Medicine) Melissa Montane, MD as Consulting Physician (Otolaryngology) [name], MD as Consulting Physician (Urology)  Extended Emergency Contact Information Primary Emergency Contact: Bolton,Cynthia Address: [address]          [place], [place] [address] Johnnette Litter of [place] Phone: [phone] Work Phone: [phone] Mobile Phone: [phone] Relation: Spouse Secondary Emergency Contact: Lennox Laity Address: [address]           [place], [place] [address] Johnnette Litter of [place] Phone: [phone] Mobile Phone: [phone] Relation: Daughter   Goals of care: Advanced Directive information Advanced Directives 11/04/2017  Does Patient Have a Medical Advance Directive? No  Would patient like information on creating a medical advance directive? -     Chief Complaint  Patient presents with  . Acute Visit    Possible shingles, patient c/o rash on right side of back that wraps around to the front. Rash onset 2 days ago     HPI:  Pt is a 67 y.o. male seen today at [facility] office for an acute visit for evaluation of rash x 2 days.He describes rash as painful with a stinging nerve sensation.He states rash started from the right upper back and has spread to his right rib cage.He thought he pulled a shoulder but when wife check his back he noticed the rash.He states some the rash opened up. He denies any fever or chills.    Past Medical History:  Diagnosis Date  . Arthritis   . Bladder cancer [facility]) urologist-  dr Kipp Brood  . Borderline diabetes   . Bursitis of both shoulders   . Diabetes mellitus without complication ([place])   . Disorder of male genital organs, unspecified   . Esophagus disorder    Constricted Esophagus, per New Patient Packet-PSC   . History of basal cell carcinoma (BCC) excision    05/ 2018  right forehead  .  Hypertension   . Mild acid reflux no meds  . Nocturia   . OSA (obstructive sleep apnea)    per pt has not used cpap in years  . Productive cough    from smoking  . Renal cyst, left 11/2011   16 mm  . Sleep apnea    Per New Patient Packet-PSC   . Smokers' cough ([place])   . SOB (shortness of breath) on exertion    Past Surgical History:  Procedure Laterality Date  . CYSTOSCOPY WITH BIOPSY  03/13/2012   Procedure: CYSTOSCOPY WITH BIOPSY;  Surgeon: Fredricka Bonine, MD;  Location: [facility];  Service: Urology;  Laterality: N/A;     . ESOPHAGEAL DILATION  05/20/1986   Esophagus Stretch, per New Patient Packet-PSC   . TONSILLECTOMY  age 77  . TRANSURETHRAL RESECTION OF BLADDER TUMOR N/A 05/23/2017   Procedure: TRANSURETHRAL RESECTION OF BLADDER TUMOR / (TURBT) WITH INSTILLATION OF EPIRUBICIN;  Surgeon: [name], MD;  Location: [facility];  Service: Urology;  Laterality: N/A;  . UVULECTOMY  05/20/1990   Dr.Byers, per new patient packet   . UVULOPALATOPHARYNGOPLASTY  2005   osa    Allergies  Allergen Reactions  . Chantix [Varenicline Tartrate]     Aggression     Outpatient Encounter Medications as of 01/22/2018  Medication Sig  . acetaminophen (TYLENOL) 325 MG tablet Take 650 mg by mouth every 6 (six) hours as needed.  Marland Kitchen amLODipine-benazepril (LOTREL) 5-20 MG  per capsule Take 1 capsule by mouth every morning.   . Blood Glucose Monitoring Suppl (ONE TOUCH ULTRA 2) w/Device KIT 1 Device by Does not apply route daily. Dx: E11.65  . glucose blood test strip 1 each by Other route daily. Dx: E11.65  . ibuprofen (ADVIL,MOTRIN) 200 MG tablet Take 200 mg by mouth every 6 (six) hours as needed.  . metFORMIN (GLUCOPHAGE) 1000 MG tablet Take 1 tablet (1,000 mg total) by mouth 2 (two) times daily with a meal.  . valACYclovir (VALTREX) 1000 MG tablet Take 1 tablet (1,000 mg total) by mouth 3 (three) times daily for 7 days.   Facility-Administered  Encounter Medications as of 01/22/2018  Medication  . epirubicin (ELLENCE) 50 mg in sodium chloride 0.9 % bladder instillation    Review of Systems  Constitutional: Negative for chills, fatigue and fever.  HENT: Negative for congestion, rhinorrhea, sinus pressure, sinus pain, sneezing and sore throat.   Eyes: Negative for discharge and itching.  Respiratory: Negative for chest tightness, shortness of breath and wheezing.        Chronic cough   Cardiovascular: Negative for chest pain, palpitations and leg swelling.  Gastrointestinal: Negative for abdominal distention, abdominal pain, constipation, diarrhea, nausea and vomiting.  Skin: Negative for color change and pallor.       Rash per HPI   Neurological: Negative for dizziness, light-headedness and headaches.  Psychiatric/Behavioral: Negative for agitation and sleep disturbance.    Immunization History  Administered Date(s) Administered  . Pneumococcal Conjugate-13 09/16/2017   Pertinent  Health Maintenance Due  Topic Date Due  . FOOT EXAM  06/09/1961  . OPHTHALMOLOGY EXAM  06/09/1961  . COLONOSCOPY  06/09/2001  . INFLUENZA VACCINE  12/18/2017  . HEMOGLOBIN A1C  03/14/2018  . PNA vac Low Risk Adult (2 of 2 - PPSV23) 09/17/2018   Fall Risk  11/04/2017 10/15/2017 09/16/2017 08/19/2017  Falls in the past year? No Yes Yes No  Number falls in past yr: - 2 or more 1 -  Injury with Fall? - No No -   Functional Status Survey:    Vitals:   01/22/18 1125  BP: (!) 142/90  Pulse: 100  Temp: 98.5 F (36.9 C)  TempSrc: Oral  SpO2: 95%  Weight: 243 lb (110.2 kg)  Height: 6' (1.829 m)   Body mass index is 32.96 kg/m. Physical Exam  Constitutional: He is oriented to person, place, and time. He appears well-developed and well-nourished. No distress.  HENT:  Head: Normocephalic.  Mouth/Throat: Oropharynx is clear and moist. No oropharyngeal exudate.  Eyes: Pupils are equal, round, and reactive to light. Conjunctivae are normal. Right  eye exhibits no discharge. Left eye exhibits no discharge. No scleral icterus.  Neck: Normal range of motion.  Cardiovascular: Normal rate, regular rhythm, normal heart sounds and intact distal pulses. Exam reveals no gallop and no friction rub.  No murmur heard. Pulmonary/Chest: Effort normal and breath sounds normal. No respiratory distress. He has no wheezes. He has no rales. He exhibits no tenderness.  Abdominal: Soft. Bowel sounds are normal. He exhibits no distension and no mass. There is no tenderness. There is no rebound and no guarding.  Musculoskeletal: Normal range of motion. He exhibits no edema or tenderness.  Lymphadenopathy:    He has no cervical adenopathy.  Neurological: He is oriented to person, place, and time.  Skin: Skin is warm and dry. No erythema. No pallor.  Right upper back and right rib cage multiple vesicular red rash along dermatone.  Psychiatric: He has a normal mood and affect. His behavior is normal. Judgment and thought content normal.  Vitals reviewed.  Labs reviewed: Recent Labs    05/23/17 0745 09/12/17 0835  NA 140 140  K 4.1 4.1  CL 100* 103  CO2  --  29  GLUCOSE 252* 247*  BUN 19 12  CREATININE 0.70 0.80  CALCIUM  --  9.6   Recent Labs    09/12/17 0835  AST 12  ALT 19  BILITOT 1.4*  PROT 6.5   Recent Labs    05/23/17 0745 09/12/17 0835  WBC  --  7.6  NEUTROABS  --  4,978  HGB 16.7 15.9  HCT 49.0 46.3  MCV  --  87.9  PLT  --  245   No results found for: TSH Lab Results  Component Value Date   HGBA1C 9.8 (H) 09/12/2017   Lab Results  Component Value Date   CHOL 151 09/12/2017   HDL 43 09/12/2017   LDLCALC 85 09/12/2017   TRIG 133 09/12/2017   CHOLHDL 3.5 09/12/2017    Significant Diagnostic Results in last 30 days:  No results found.  Assessment/Plan   Herpes zoster without complication Afebrile.right upper back and right rib cage multiple fluid filled vesicles along same dermatone highly suspicious for  shingles.Has had no shingle vaccine.shingle rash covered with non-adhesive gauze and secured with paper tape during visit.Patient encouraged to have wife cover rash area with gauze until vesicles dry up.start on valacyclovir 1 gm one by mouth three times daily x 7 days.follow up here at the office  in one week for evaluation.  OTC tylenol 1000 mg tablet twice daily and 500 mg tablet at 2 Pm recommended  but states tylenol makes him sleepy and he has to drive a lot during the day.Encouraged to take 500 mg tablet or lower dose if needed. 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 Follow up in one week for evaluation of rash.   Family/ staff Communication: Reviewed plan of care with patient  Labs/tests ordered: None   Jestine Bicknell C Daesha Insco, NP

## 2018-01-22 NOTE — Patient Instructions (Signed)

## 2018-01-29 ENCOUNTER — Encounter: Payer: Self-pay | Admitting: Family

## 2018-01-29 ENCOUNTER — Ambulatory Visit (INDEPENDENT_AMBULATORY_CARE_PROVIDER_SITE_OTHER): Payer: Medicare Other | Admitting: Family

## 2018-01-29 VITALS — BP 136/78 | HR 96 | Temp 98.3°F | Ht 72.0 in | Wt 244.6 lb

## 2018-01-29 DIAGNOSIS — B029 Zoster without complications: Secondary | ICD-10-CM | POA: Diagnosis not present

## 2018-01-29 NOTE — Progress Notes (Signed)
Provider: Dinah Ngetich FNP-C  Lauree Chandler, NP  Patient Care Team: Lauree Chandler, NP as PCP - General (Geriatric Medicine) Melissa Montane, MD as Consulting Physician (Otolaryngology) [name], MD as Consulting Physician (Urology)  Extended Emergency Contact Information Primary Emergency Contact: Bolton,Cynthia Address: [address]          [place], [place] [address] Johnnette Litter of [place] Phone: [phone] Work Phone: [phone] Mobile Phone: [phone] Relation: Spouse Secondary Emergency Contact: Lennox Laity Address: [address]           [place], [place] [address] Johnnette Litter of [place] Phone: [phone] Mobile Phone: [phone] Relation: Daughter   Goals of care: Advanced Directive information Advanced Directives 01/29/2018  Does Patient Have a Medical Advance Directive? No  Would patient like information on creating a medical advance directive? No - Patient declined     Chief Complaint  Patient presents with  . Follow-up    Pt is being seen to follow up on shingles. Pt reports that rash has cleared. he states that he is not having much pain.     HPI:  Pt is a 67 y.o. male seen today at [facility] office for an acute visit for follow up right upper back/rib cage shingle rash.He states has completed Valacyclovir as directed.He states the pain,stinging sensation has improved.He still has some stinging sensation at times but overall much improvement.He took some old Hydrocodone that he had in the past for pain once but has not taken anymore.He still has some pills if needed. He denies any fever,chills or itchying.    Past Medical History:  Diagnosis Date  . Arthritis   . Bladder cancer [facility]) urologist-  dr Kipp Brood  . Borderline diabetes   . Bursitis of both shoulders   . Diabetes mellitus without complication ([place])   . Disorder of male genital organs, unspecified   . Esophagus disorder    Constricted Esophagus, per New Patient  Packet-PSC   . History of basal cell carcinoma (BCC) excision    05/ 2018  right forehead  . Hypertension   . Mild acid reflux no meds  . Nocturia   . OSA (obstructive sleep apnea)    per pt has not used cpap in years  . Productive cough    from smoking  . Renal cyst, left 11/2011   16 mm  . Sleep apnea    Per New Patient Packet-PSC   . Smokers' cough ([place])   . SOB (shortness of breath) on exertion    Past Surgical History:  Procedure Laterality Date  . CYSTOSCOPY WITH BIOPSY  03/13/2012   Procedure: CYSTOSCOPY WITH BIOPSY;  Surgeon: Fredricka Bonine, MD;  Location: [facility];  Service: Urology;  Laterality: N/A;     . ESOPHAGEAL DILATION  05/20/1986   Esophagus Stretch, per New Patient Packet-PSC   . TONSILLECTOMY  age 69  . TRANSURETHRAL RESECTION OF BLADDER TUMOR N/A 05/23/2017   Procedure: TRANSURETHRAL RESECTION OF BLADDER TUMOR / (TURBT) WITH INSTILLATION OF EPIRUBICIN;  Surgeon: [name], MD;  Location: [facility];  Service: Urology;  Laterality: N/A;  . UVULECTOMY  05/20/1990   Dr.Byers, per new patient packet   . UVULOPALATOPHARYNGOPLASTY  2005   osa    Allergies  Allergen Reactions  . Chantix [Varenicline Tartrate]     Aggression     Outpatient Encounter Medications as of 01/29/2018  Medication Sig  . acetaminophen (TYLENOL) 325 MG tablet Take 650 mg by mouth every 6 (six)  hours as needed.  Marland Kitchen amLODipine-benazepril (LOTREL) 5-20 MG per capsule Take 1 capsule by mouth every morning.   . Blood Glucose Monitoring Suppl (ONE TOUCH ULTRA 2) w/Device KIT 1 Device by Does not apply route daily. Dx: E11.65  . glucose blood test strip 1 each by Other route daily. Dx: E11.65  . ibuprofen (ADVIL,MOTRIN) 200 MG tablet Take 200 mg by mouth every 6 (six) hours as needed.  . metFORMIN (GLUCOPHAGE) 1000 MG tablet Take 1 tablet (1,000 mg total) by mouth 2 (two) times daily with a meal.  . [DISCONTINUED] valACYclovir (VALTREX) 1000  MG tablet Take 1 tablet (1,000 mg total) by mouth 3 (three) times daily for 7 days.   Facility-Administered Encounter Medications as of 01/29/2018  Medication  . epirubicin (ELLENCE) 50 mg in sodium chloride 0.9 % bladder instillation    Review of Systems  Constitutional: Negative for appetite change, chills, fatigue and fever.  HENT: Negative for congestion, rhinorrhea, sinus pressure, sinus pain, sneezing and sore throat.   Eyes: Negative for discharge, redness and itching.  Respiratory: Negative for cough, chest tightness, shortness of breath and wheezing.   Cardiovascular: Negative for chest pain, palpitations and leg swelling.  Gastrointestinal: Negative for abdominal distention, abdominal pain, constipation, diarrhea, nausea and vomiting.  Musculoskeletal: Negative for arthralgias.  Skin: Positive for rash. Negative for color change and pallor.       Right back/rib cage shingles rash has improved.  Neurological: Negative for dizziness, light-headedness, numbness and headaches.  Psychiatric/Behavioral: Negative for agitation and sleep disturbance. The patient is not nervous/anxious.     Immunization History  Administered Date(s) Administered  . Pneumococcal Conjugate-13 09/16/2017   Pertinent  Health Maintenance Due  Topic Date Due  . FOOT EXAM  06/09/1961  . OPHTHALMOLOGY EXAM  06/09/1961  . COLONOSCOPY  06/09/2001  . INFLUENZA VACCINE  12/18/2017  . HEMOGLOBIN A1C  03/14/2018  . PNA vac Low Risk Adult (2 of 2 - PPSV23) 09/17/2018   Fall Risk  01/29/2018 11/04/2017 10/15/2017 09/16/2017 08/19/2017  Falls in the past year? Yes No Yes Yes No  Number falls in past yr: 1 - 2 or more 1 -  Injury with Fall? No - No No -    Vitals:   01/29/18 1049  BP: 136/78  Pulse: 96  Temp: 98.3 F (36.8 C)  TempSrc: Oral  SpO2: 96%  Weight: 244 lb 9.6 oz (110.9 kg)  Height: 6' (1.829 m)   Body mass index is 33.17 kg/m. Physical Exam  Constitutional: He is oriented to person, place,  and time. He appears well-developed and well-nourished. No distress.  HENT:  Head: Normocephalic.  Right Ear: External ear normal.  Left Ear: External ear normal.  Mouth/Throat: Oropharynx is clear and moist. No oropharyngeal exudate.  Eyes: Pupils are equal, round, and reactive to light. Conjunctivae and EOM are normal. Right eye exhibits no discharge. Left eye exhibits no discharge. No scleral icterus.  Neck: Normal range of motion. No JVD present. No thyromegaly present.  Cardiovascular: Normal rate, regular rhythm, normal heart sounds and intact distal pulses. Exam reveals no gallop and no friction rub.  No murmur heard. Pulmonary/Chest: Effort normal and breath sounds normal. No respiratory distress. He has no wheezes. He has no rales.  Abdominal: Soft. Bowel sounds are normal. He exhibits no distension and no mass. There is no tenderness. There is no rebound and no guarding.  Musculoskeletal: Normal range of motion. He exhibits no edema or tenderness.  Lymphadenopathy:    He has  no cervical adenopathy.  Neurological: He is oriented to person, place, and time.  Skin: Skin is warm and dry. No pallor.  Previous vesicular rash resolved scabbing noted on right flank and ribcage area.  Psychiatric: He has a normal mood and affect. His behavior is normal. Judgment and thought content normal.    Labs reviewed: Recent Labs    05/23/17 0745 09/12/17 0835  NA 140 140  K 4.1 4.1  CL 100* 103  CO2  --  29  GLUCOSE 252* 247*  BUN 19 12  CREATININE 0.70 0.80  CALCIUM  --  9.6   Recent Labs    09/12/17 0835  AST 12  ALT 19  BILITOT 1.4*  PROT 6.5   Recent Labs    05/23/17 0745 09/12/17 0835  WBC  --  7.6  NEUTROABS  --  4,978  HGB 16.7 15.9  HCT 49.0 46.3  MCV  --  87.9  PLT  --  245   No results found for: TSH Lab Results  Component Value Date   HGBA1C 9.8 (H) 09/12/2017   Lab Results  Component Value Date   CHOL 151 09/12/2017   HDL 43 09/12/2017   LDLCALC 85  09/12/2017   TRIG 133 09/12/2017   CHOLHDL 3.5 09/12/2017    Significant Diagnostic Results in last 30 days:  No results found.  Assessment/Plan  Herpes zoster without complication Afebrile.Vesicles rash resolved with valacyclovir treatment scabbing areas noted.stiging sensation has improved per patient.Has old hydrocodone that he takes as needed though has only required once during breakout of rash.continue to monitor for now.Valcyclovir 7 days course completed.will initiate gabapentin if stinging sensation persist or worsen for possible post neuralgic pain.   Family/ staff Communication: Reviewed plan of care with patient.  Labs/tests ordered: None   Dinah C Ngetich, NP

## 2018-04-09 ENCOUNTER — Telehealth: Payer: Self-pay | Admitting: Nurse Practitioner

## 2018-04-09 NOTE — Telephone Encounter (Signed)
I called the patient to schedule his AWV/EV.  He said that this is the busiest time of year for him and asked that I call back after Christmas. VDM (DD)

## 2018-08-18 ENCOUNTER — Other Ambulatory Visit: Payer: Self-pay | Admitting: *Deleted

## 2018-08-18 MED ORDER — AMLODIPINE BESY-BENAZEPRIL HCL 5-20 MG PO CAPS: 1.0000 | ORAL_CAPSULE | Freq: Every morning | ORAL | 0 refills | Status: DC

## 2018-08-18 NOTE — Telephone Encounter (Signed)
Patient called and left message on Clinical intake that he needs a refill on BP medication.  Tried calling patient back to let him know he will need to schedule an appointment before any future refills. Number rings busy x2. Placed comment on Rx.

## 2018-09-18 ENCOUNTER — Other Ambulatory Visit: Payer: Self-pay

## 2018-09-18 ENCOUNTER — Encounter: Payer: Self-pay | Admitting: Nurse Practitioner

## 2018-09-18 ENCOUNTER — Ambulatory Visit (INDEPENDENT_AMBULATORY_CARE_PROVIDER_SITE_OTHER): Payer: Medicare Other | Admitting: Nurse Practitioner

## 2018-09-18 DIAGNOSIS — G4733 Obstructive sleep apnea (adult) (pediatric): Secondary | ICD-10-CM

## 2018-09-18 DIAGNOSIS — F17200 Nicotine dependence, unspecified, uncomplicated: Secondary | ICD-10-CM | POA: Diagnosis not present

## 2018-09-18 DIAGNOSIS — Z8551 Personal history of malignant neoplasm of bladder: Secondary | ICD-10-CM | POA: Diagnosis not present

## 2018-09-18 DIAGNOSIS — M5441 Lumbago with sciatica, right side: Secondary | ICD-10-CM

## 2018-09-18 DIAGNOSIS — I10 Essential (primary) hypertension: Secondary | ICD-10-CM

## 2018-09-18 DIAGNOSIS — E1165 Type 2 diabetes mellitus with hyperglycemia: Secondary | ICD-10-CM | POA: Diagnosis not present

## 2018-09-18 DIAGNOSIS — G8929 Other chronic pain: Secondary | ICD-10-CM | POA: Diagnosis not present

## 2018-09-18 DIAGNOSIS — M1991 Primary osteoarthritis, unspecified site: Secondary | ICD-10-CM | POA: Diagnosis not present

## 2018-09-18 NOTE — Patient Instructions (Addendum)
Blood pressure goal <140/90  Continue to cut back on smoking  Make sure to get lab work!   Tylenol 1000 mg by mouth every 8 hours as needed for pain is safe and effective.   You can use muscle rub otc (aspercream, benegay, biofreeze, icyhot) as needed    Diabetes Mellitus and Nutrition, Adult When you have diabetes (diabetes mellitus), it is very important to have healthy eating habits because your blood sugar (glucose) levels are greatly affected by what you eat and drink. Eating healthy foods in the appropriate amounts, at about the same times every day, can help you:  Control your blood glucose.  Lower your risk of heart disease.  Improve your blood pressure.  Reach or maintain a healthy weight. Every person with diabetes is different, and each person has different needs for a meal plan. Your health care provider may recommend that you work with a diet and nutrition specialist (dietitian) to make a meal plan that is best for you. Your meal plan may vary depending on factors such as:  The calories you need.  The medicines you take.  Your weight.  Your blood glucose, blood pressure, and cholesterol levels.  Your activity level.  Other health conditions you have, such as heart or kidney disease. How do carbohydrates affect me? Carbohydrates, also called carbs, affect your blood glucose level more than any other type of food. Eating carbs naturally raises the amount of glucose in your blood. Carb counting is a method for keeping track of how many carbs you eat. Counting carbs is important to keep your blood glucose at a healthy level, especially if you use insulin or take certain oral diabetes medicines. It is important to know how many carbs you can safely have in each meal. This is different for every person. Your dietitian can help you calculate how many carbs you should have at each meal and for each snack. Foods that contain carbs include:  Bread, cereal, rice, pasta, and  crackers.  Potatoes and corn.  Peas, beans, and lentils.  Milk and yogurt.  Fruit and juice.  Desserts, such as cakes, cookies, ice cream, and candy. How does alcohol affect me? Alcohol can cause a sudden decrease in blood glucose (hypoglycemia), especially if you use insulin or take certain oral diabetes medicines. Hypoglycemia can be a life-threatening condition. Symptoms of hypoglycemia (sleepiness, dizziness, and confusion) are similar to symptoms of having too much alcohol. If your health care provider says that alcohol is safe for you, follow these guidelines:  Limit alcohol intake to no more than 1 drink per day for nonpregnant women and 2 drinks per day for men. One drink equals 12 oz of beer, 5 oz of wine, or 1 oz of hard liquor.  Do not drink on an empty stomach.  Keep yourself hydrated with water, diet soda, or unsweetened iced tea.  Keep in mind that regular soda, juice, and other mixers may contain a lot of sugar and must be counted as carbs. What are tips for following this plan?  Reading food labels  Start by checking the serving size on the "Nutrition Facts" label of packaged foods and drinks. The amount of calories, carbs, fats, and other nutrients listed on the label is based on one serving of the item. Many items contain more than one serving per package.  Check the total grams (g) of carbs in one serving. You can calculate the number of servings of carbs in one serving by dividing the total carbs  by 15. For example, if a food has 30 g of total carbs, it would be equal to 2 servings of carbs.  Check the number of grams (g) of saturated and trans fats in one serving. Choose foods that have low or no amount of these fats.  Check the number of milligrams (mg) of salt (sodium) in one serving. Most people should limit total sodium intake to less than 2,300 mg per day.  Always check the nutrition information of foods labeled as "low-fat" or "nonfat". These foods may be  higher in added sugar or refined carbs and should be avoided.  Talk to your dietitian to identify your daily goals for nutrients listed on the label. Shopping  Avoid buying canned, premade, or processed foods. These foods tend to be high in fat, sodium, and added sugar.  Shop around the outside edge of the grocery store. This includes fresh fruits and vegetables, bulk grains, fresh meats, and fresh dairy. Cooking  Use low-heat cooking methods, such as baking, instead of high-heat cooking methods like deep frying.  Cook using healthy oils, such as olive, canola, or sunflower oil.  Avoid cooking with butter, cream, or high-fat meats. Meal planning  Eat meals and snacks regularly, preferably at the same times every day. Avoid going long periods of time without eating.  Eat foods high in fiber, such as fresh fruits, vegetables, beans, and whole grains. Talk to your dietitian about how many servings of carbs you can eat at each meal.  Eat 4-6 ounces (oz) of lean protein each day, such as lean meat, chicken, fish, eggs, or tofu. One oz of lean protein is equal to: ? 1 oz of meat, chicken, or fish. ? 1 egg. ?  cup of tofu.  Eat some foods each day that contain healthy fats, such as avocado, nuts, seeds, and fish. Lifestyle  Check your blood glucose regularly.  Exercise regularly as told by your health care provider. This may include: ? 150 minutes of moderate-intensity or vigorous-intensity exercise each week. This could be brisk walking, biking, or water aerobics. ? Stretching and doing strength exercises, such as yoga or weightlifting, at least 2 times a week.  Take medicines as told by your health care provider.  Do not use any products that contain nicotine or tobacco, such as cigarettes and e-cigarettes. If you need help quitting, ask your health care provider.  Work with a Social worker or diabetes educator to identify strategies to manage stress and any emotional and social  challenges. Questions to ask a health care provider  Do I need to meet with a diabetes educator?  Do I need to meet with a dietitian?  What number can I call if I have questions?  When are the best times to check my blood glucose? Where to find more information:  American Diabetes Association: diabetes.org  Academy of Nutrition and Dietetics: www.eatright.[employer] of Diabetes and Digestive and Kidney Diseases (NIH): [employer] Summary  A healthy meal plan will help you control your blood glucose and maintain a healthy lifestyle.  Working with a diet and nutrition specialist (dietitian) can help you make a meal plan that is best for you.  Keep in mind that carbohydrates (carbs) and alcohol have immediate effects on your blood glucose levels. It is important to count carbs and to use alcohol carefully. This information is not intended to replace advice given to you by your health care provider. Make sure you discuss any questions you have with your health care  provider. Document Released: 01/31/2005 Document Revised: 12/04/2016 Document Reviewed: 06/10/2016 Elsevier Interactive Patient Education  2019 [employer].

## 2018-09-18 NOTE — Progress Notes (Signed)
This service is provided via telemedicine  No vital signs collected/recorded due to the encounter was a telemedicine visit.   Location of patient (ex: home, work): Home  Patient consents to a telephone visit: Yes  Location of the provider (ex: office, home):    Name of any referring provider: N/A  Names of all persons participating in the telemedicine service and their role in the encounter:  S.Chrae B/CMA, Sherrie Mustache, NP, and Patient    Time spent on call: 9 min with medical assistant   Virtual Visit via Telephone Note  I connected with Timothy Lee on 09/18/18 at  1:00 PM EDT by telephone and verified that I am speaking with the correct person using two identifiers.  Location: Patient: home Provider: office   I discussed the limitations, risks, security and privacy concerns of performing an evaluation and management service by telephone and the availability of in person appointments. I also discussed with the patient that there may be a patient responsible charge related to this service. The patient expressed understanding and agreed to proceed.    Careteam: Patient Care Team: Lauree Chandler, NP as PCP - General (Geriatric Medicine) Melissa Montane, MD as Consulting Physician (Otolaryngology) [name], MD as Consulting Physician (Urology)  Advanced Directive information    Allergies  Allergen Reactions   Chantix [Varenicline Tartrate]     Aggression     Chief Complaint  Patient presents with   Medical Management of Chronic Issues    8 month follow-up. Moderate fall risk    Medication Management    Discuss Lotrel, patient's dose was changed    Best Practice Recommendations    Discuss need for Hep C screening, eye exam, and TDaP     HPI: Patient is a 68 y.o. male for routine follow up  Dm- taking metformin 1000 mg by mouth twice daily.  Spot checks blood sugars- around 150, never above 160. Reports blurred vision. Denies numbness  or tingling in leg.  No constipation.   htn- does not check blood pressure at home.   Tobacco Abuse- has cut back on smoking and this has helped breathing, continues to cut back.   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   Hx of bladder cancer- ongoing follow up with urology.   OSA- can not tolerate CPAP  Reports hx bulging disc to lumbar spine to his back- when back tightens up he would take a soma which would significantly help the pain. Using vinegar or mustard which does not help the pain.  He will have a flare 1-2 times a month, sometimes will go several month and not have a flare. Last flare was 1.5-2 months ago and had a knot in his back and could not stand up. Has not taken anything in a while but significantly helped the pain. Needs something to help "unknot" the muscle.    Review of Systems:  Review of Systems  Constitutional: Negative for chills, fever and weight loss.  HENT: Positive for congestion. Negative for tinnitus.   Eyes: Positive for blurred vision.       Plans to follow up with eye doctor.   Respiratory: Positive for cough and sputum production. Negative for shortness of breath.        Hx of smoking, overall cough and congestion has improved  Cardiovascular: Negative for chest pain, palpitations and leg swelling.  Gastrointestinal: Negative for abdominal pain, constipation, diarrhea and heartburn.  Genitourinary: Negative for  dysuria, frequency and urgency.       Continues to follow with urologist due to bladder cancer- overdue   Musculoskeletal: Negative for back pain, falls and myalgias.       Thumbs hurting more with age- went to orthopedic.   Skin: Negative.   Neurological: Negative for dizziness and headaches.  Endo/Heme/Allergies: Positive for environmental allergies.  Psychiatric/Behavioral: Negative for depression and memory loss. The patient does not have insomnia.     Past Medical History:  Diagnosis Date    Arthritis    Bladder cancer [facility]) urologist-  dr Kipp Brood   Borderline diabetes    Bursitis of both shoulders    Diabetes mellitus without complication ([place])    Disorder of male genital organs, unspecified    Esophagus disorder    Constricted Esophagus, per New Patient Packet-PSC    History of basal cell carcinoma (BCC) excision    05/ 2018  right forehead   Hypertension    Mild acid reflux no meds   Nocturia    OSA (obstructive sleep apnea)    per pt has not used cpap in years   Productive cough    from smoking   Renal cyst, left 11/2011   16 mm   Sleep apnea    Per New Patient Packet-PSC    Smokers' cough ([place])    SOB (shortness of breath) on exertion    Past Surgical History:  Procedure Laterality Date   CYSTOSCOPY WITH BIOPSY  03/13/2012   Procedure: CYSTOSCOPY WITH BIOPSY;  Surgeon: Fredricka Bonine, MD;  [facility];  Service: Urology;  Laterality: N/A;      ESOPHAGEAL DILATION  05/20/1986   Esophagus Stretch, per New Patient Packet-PSC    TONSILLECTOMY  age 21   TRANSURETHRAL RESECTION OF BLADDER TUMOR N/A 05/23/2017   Procedure: TRANSURETHRAL RESECTION OF BLADDER TUMOR / (TURBT) WITH INSTILLATION OF EPIRUBICIN;  Surgeon: [name], MD;  Location: [place];  Service: Urology;  Laterality: N/A;   UVULECTOMY  05/20/1990   Dr.Byers, per new patient packet    UVULOPALATOPHARYNGOPLASTY  2005   osa   Social History:   reports that he has been smoking cigarettes. He has a 23.50 pack-year smoking history. He has never used smokeless tobacco. He reports current alcohol use. He reports current drug use. Drug: Marijuana.  Family History  Problem Relation Age of Onset   Diabetes Mother    Dementia Mother    Stroke Mother    Arthritis Mother    Hypertension Mother    Multiple sclerosis Father    Diabetes Father    Diabetes Sister    Hypertension Sister    ADD / ADHD Son    Migraines  Daughter    ADD / ADHD Daughter    Cancer Maternal Grandmother     Medications: Patient's Medications  New Prescriptions   No medications on file  Previous Medications   ACETAMINOPHEN (TYLENOL) 325 MG TABLET    Take 650 mg by mouth every 6 (six) hours as needed.   AMLODIPINE-BENAZEPRIL (LOTREL) 5-20 MG CAPSULE    Take 1 capsule by mouth every morning. Needs Appointment before future refills.   BLOOD GLUCOSE MONITORING SUPPL (ONE TOUCH ULTRA 2) W/DEVICE KIT    1 Device by Does not apply route daily. Dx: E11.65   GLUCOSE BLOOD TEST STRIP    1 each by Other route daily. Dx: E11.65   IBUPROFEN (ADVIL,MOTRIN) 200 MG TABLET    Take 200 mg by mouth  every 6 (six) hours as needed.   METFORMIN (GLUCOPHAGE) 1000 MG TABLET    Take 1 tablet (1,000 mg total) by mouth 2 (two) times daily with a meal.  Modified Medications   No medications on file  Discontinued Medications   No medications on file     Physical Exam: unable due to tele-visit.     Labs reviewed: Basic Metabolic Panel: No results for input(s): NA, K, CL, CO2, GLUCOSE, BUN, CREATININE, CALCIUM, MG, PHOS, TSH in the last 8760 hours. Liver Function Tests: No results for input(s): AST, ALT, ALKPHOS, BILITOT, PROT, ALBUMIN in the last 8760 hours. No results for input(s): LIPASE, AMYLASE in the last 8760 hours. No results for input(s): AMMONIA in the last 8760 hours. CBC: No results for input(s): WBC, NEUTROABS, HGB, HCT, MCV, PLT in the last 8760 hours. Lipid Panel: No results for input(s): CHOL, HDL, LDLCALC, TRIG, CHOLHDL, LDLDIRECT in the last 8760 hours. TSH: No results for input(s): TSH in the last 8760 hours. A1C: Lab Results  Component Value Date   HGBA1C 9.8 (H) 09/12/2017     Assessment/Plan 1. Type 2 diabetes mellitus with hyperglycemia, without long-term current use of insulin ([place]) -he has not followed up on diabetes and does not take sugar regularly. Stressed importance of routine follow up and control of  diabetes. Encouraged dietary compliance, routine foot care/monitoring and to keep up with diabetic eye exams through ophthalmology (at risk for diabetic retinopathy/loss of vision with uncontrolled blood sugar, risk of renal failure etc)  - Hemoglobin A1c; Future - Lipid Panel; Future - COMPLETE METABOLIC PANEL WITH GFR; Future  2. Essential hypertension Does not check blood pressure at home. To continue dietary modifications goal bp <140/90 - CBC with Differential/Platelet; Future  3. Smoker Encouraged smoking cessation, has cut back  4. Hx of bladder cancer Ongoing follow up with urology  5. OSA (obstructive sleep apnea) -does not wear CPAP, can not tolerate it.   6. Chronic right-sided low back pain with right-sided sciatica Occasional with exacerbation. Not compliant with lifting restriction given by neurosurgery of <20 lbs. Has used soma in the past.  Encouraged to use heat as needed, muscle rub AFTER heat  -tylenol 1000 mg by mouth every 8 hours as needed for pain.  7. Primary osteoarthritis, unspecified site Educated okay to use tylenol 1000 mg by mouth every 8 hours as needed for pain.  Next appt: 09/21/2018 labs,  Then 4 months follow up (sooner if needed)  Cortne Amara K. Harle Battiest  [facility] [phone]   Follow Up Instructions:    I discussed the assessment and treatment plan with the patient. The patient was provided an opportunity to ask questions and all were answered. The patient agreed with the plan and demonstrated an understanding of the instructions.   The patient was advised to call back or seek an in-person evaluation if the symptoms worsen or if the condition fails to improve as anticipated.  I provided 27 minutes of non-face-to-face time during this encounter.   Lauree Chandler, NP avs printed and mailed

## 2018-09-21 ENCOUNTER — Other Ambulatory Visit: Payer: Medicare Other

## 2018-09-22 ENCOUNTER — Other Ambulatory Visit: Payer: Medicare Other

## 2018-09-22 ENCOUNTER — Other Ambulatory Visit: Payer: Self-pay

## 2018-09-22 DIAGNOSIS — E1165 Type 2 diabetes mellitus with hyperglycemia: Secondary | ICD-10-CM | POA: Diagnosis not present

## 2018-09-22 DIAGNOSIS — I10 Essential (primary) hypertension: Secondary | ICD-10-CM | POA: Diagnosis not present

## 2018-09-23 ENCOUNTER — Ambulatory Visit: Payer: Medicare Other | Admitting: Nurse Practitioner

## 2018-09-23 LAB — CBC WITH DIFFERENTIAL/PLATELET
Absolute Monocytes: 562 cells/uL (ref 200–950)
Basophils Absolute: 39 cells/uL (ref 0–200)
Basophils Relative: 0.5 %
Eosinophils Absolute: 647 cells/uL — ABNORMAL HIGH (ref 15–500)
Eosinophils Relative: 8.3 %
HCT: 46.8 % (ref 38.5–50.0)
Hemoglobin: 15.9 g/dL (ref 13.2–17.1)
Lymphs Abs: 2114 cells/uL (ref 850–3900)
MCH: 30.3 pg (ref 27.0–33.0)
MCHC: 34 g/dL (ref 32.0–36.0)
MCV: 89.1 fL (ref 80.0–100.0)
MPV: 10.4 fL (ref 7.5–12.5)
Monocytes Relative: 7.2 %
Neutro Abs: 4438 cells/uL (ref 1500–7800)
Neutrophils Relative %: 56.9 %
Platelets: 274 10*3/uL (ref 140–400)
RBC: 5.25 10*6/uL (ref 4.20–5.80)
RDW: 12.3 % (ref 11.0–15.0)
Total Lymphocyte: 27.1 %
WBC: 7.8 10*3/uL (ref 3.8–10.8)

## 2018-09-23 LAB — COMPLETE METABOLIC PANEL WITH GFR
AG Ratio: 2.9 (calc) — ABNORMAL HIGH (ref 1.0–2.5)
ALT: 19 U/L (ref 9–46)
AST: 13 U/L (ref 10–35)
Albumin: 4.6 g/dL (ref 3.6–5.1)
Alkaline phosphatase (APISO): 55 U/L (ref 35–144)
BUN: 14 mg/dL (ref 7–25)
CO2: 26 mmol/L (ref 20–32)
Calcium: 9.5 mg/dL (ref 8.6–10.3)
Chloride: 103 mmol/L (ref 98–110)
Creat: 0.83 mg/dL (ref 0.70–1.25)
GFR, Est African American: 106 mL/min/{1.73_m2} (ref >=60)
GFR, Est Non African American: 91 mL/min/{1.73_m2} (ref >=60)
Globulin: 1.6 g/dL (calc) — ABNORMAL LOW (ref 1.9–3.7)
Glucose, Bld: 216 mg/dL — ABNORMAL HIGH (ref 65–99)
Potassium: 4.3 mmol/L (ref 3.5–5.3)
Sodium: 137 mmol/L (ref 135–146)
Total Bilirubin: 1.1 mg/dL (ref 0.2–1.2)
Total Protein: 6.2 g/dL (ref 6.1–8.1)

## 2018-09-23 LAB — LIPID PANEL
Cholesterol: 163 mg/dL (ref <200)
HDL: 43 mg/dL (ref >=40)
LDL Cholesterol (Calc): 98 mg/dL (calc)
Non-HDL Cholesterol (Calc): 120 mg/dL (calc) (ref <130)
Total CHOL/HDL Ratio: 3.8 (calc) (ref <5.0)
Triglycerides: 123 mg/dL (ref <150)

## 2018-09-23 LAB — HEMOGLOBIN A1C
Hgb A1c MFr Bld: 9.1 % of total Hgb — ABNORMAL HIGH (ref <5.7)
Mean Plasma Glucose: 214 (calc)
eAG (mmol/L): 11.9 (calc)

## 2018-09-24 ENCOUNTER — Other Ambulatory Visit: Payer: Self-pay | Admitting: Nurse Practitioner

## 2018-09-24 DIAGNOSIS — E1165 Type 2 diabetes mellitus with hyperglycemia: Secondary | ICD-10-CM

## 2018-09-24 MED ORDER — SITAGLIPTIN PHOSPHATE 100 MG PO TABS: 100.0000 mg | ORAL_TABLET | Freq: Every day | ORAL | 3 refills | Status: DC

## 2018-09-28 ENCOUNTER — Other Ambulatory Visit: Payer: Self-pay | Admitting: Nurse Practitioner

## 2018-09-28 ENCOUNTER — Other Ambulatory Visit: Payer: Self-pay | Admitting: *Deleted

## 2018-09-28 ENCOUNTER — Other Ambulatory Visit: Payer: Self-pay

## 2018-09-28 DIAGNOSIS — E1165 Type 2 diabetes mellitus with hyperglycemia: Secondary | ICD-10-CM

## 2018-09-28 MED ORDER — AMLODIPINE BESY-BENAZEPRIL HCL 5-20 MG PO CAPS: 1.0000 | ORAL_CAPSULE | Freq: Every morning | ORAL | 0 refills | Status: DC

## 2018-09-28 MED ORDER — METFORMIN HCL 1000 MG PO TABS: 1000.0000 mg | ORAL_TABLET | Freq: Two times a day (BID) | ORAL | 5 refills | Status: DC

## 2018-12-21 ENCOUNTER — Other Ambulatory Visit: Payer: Self-pay

## 2018-12-21 ENCOUNTER — Ambulatory Visit (INDEPENDENT_AMBULATORY_CARE_PROVIDER_SITE_OTHER): Payer: Medicare Other | Admitting: Nurse Practitioner

## 2018-12-21 ENCOUNTER — Encounter: Payer: Self-pay | Admitting: Nurse Practitioner

## 2018-12-21 ENCOUNTER — Ambulatory Visit: Payer: Medicare Other | Admitting: Nurse Practitioner

## 2018-12-21 VITALS — BP 142/86 | HR 97 | Temp 97.8°F | Ht 72.0 in | Wt 246.0 lb

## 2018-12-21 DIAGNOSIS — F17200 Nicotine dependence, unspecified, uncomplicated: Secondary | ICD-10-CM

## 2018-12-21 DIAGNOSIS — I10 Essential (primary) hypertension: Secondary | ICD-10-CM | POA: Diagnosis not present

## 2018-12-21 DIAGNOSIS — Z6833 Body mass index (BMI) 33.0-33.9, adult: Secondary | ICD-10-CM | POA: Diagnosis not present

## 2018-12-21 DIAGNOSIS — M1991 Primary osteoarthritis, unspecified site: Secondary | ICD-10-CM

## 2018-12-21 DIAGNOSIS — Z23 Encounter for immunization: Secondary | ICD-10-CM | POA: Diagnosis not present

## 2018-12-21 DIAGNOSIS — Z8551 Personal history of malignant neoplasm of bladder: Secondary | ICD-10-CM | POA: Diagnosis not present

## 2018-12-21 DIAGNOSIS — E1165 Type 2 diabetes mellitus with hyperglycemia: Secondary | ICD-10-CM

## 2018-12-21 DIAGNOSIS — E6609 Other obesity due to excess calories: Secondary | ICD-10-CM | POA: Diagnosis not present

## 2018-12-21 DIAGNOSIS — N5201 Erectile dysfunction due to arterial insufficiency: Secondary | ICD-10-CM | POA: Diagnosis not present

## 2018-12-21 MED ORDER — TETANUS-DIPHTH-ACELL PERTUSSIS 5-2.5-18.5 LF-MCG/0.5 IM SUSP: 0.5000 mL | Freq: Once | INTRAMUSCULAR | 0 refills | Status: AC

## 2018-12-21 MED ORDER — GLUCOSE BLOOD VI STRP: 1.0000 | ORAL_STRIP | Freq: Every day | 11 refills | Status: DC

## 2018-12-21 MED ORDER — TETANUS-DIPHTH-ACELL PERTUSSIS 5-2.5-18.5 LF-MCG/0.5 IM SUSP: 0.5000 mL | Freq: Once | INTRAMUSCULAR | 0 refills | Status: DC

## 2018-12-21 MED ORDER — ZOSTER VAC RECOMB ADJUVANTED 50 MCG/0.5ML IM SUSR: 0.5000 mL | Freq: Once | INTRAMUSCULAR | 1 refills | Status: AC

## 2018-12-21 NOTE — Progress Notes (Signed)
Careteam: Patient Care Team: Lauree Chandler, NP as PCP - General (Geriatric Medicine) Melissa Montane, MD as Consulting Physician (Otolaryngology) [name], MD as Consulting Physician (Urology)  Advanced Directive information Does Patient Have a Medical Advance Directive?: No, Would patient like information on creating a medical advance directive?: No - Patient declined  Allergies  Allergen Reactions  . Chantix [Varenicline Tartrate]     Aggression     Chief Complaint  Patient presents with  . Medical Management of Chronic Issues    3 month follow up  . Health Maintenance    hepatitis c screening, ophthalmology exam patient will schedule, tetanus vaccine, [id number]     HPI: Patient is a 68 y.o. male seen in the office today for routine follow up   DM- A1c was 9.1, and he was taking 500 mg twice daily. Now he is taking 1000 mg twice daily. Blood sugars 140 avg per pt report in the morning. Has made dietary changes but sometimes will eat in the middle of night and eat an ice cream cone or something sweet.  No numbness or tingling to lower legs.   Hyperlipidemia- never had an issues, LDL 98. Declines medication at this time.   Hypertension- blood pressure elevated in office. Smoked before he came into office  Smoker- down to 1/2 a day from 1 ppd, continues to cut back Has set a date for when he plans to quit.   Hx of bladder cancer- seeing urologist this afternoon  Bursitis in shoulder, following with orthopedic Plan to see eye doctor and dermatologist.  [place] getting his boat off trailer.   Review of Systems:  Review of Systems  Constitutional: Negative for chills, fever and weight loss.  HENT: Positive for congestion. Negative for tinnitus.   Eyes: Positive for blurred vision.       Plans to follow up with eye doctor but has not made appt yet, blurred vision occasionally  Respiratory: Positive for cough and sputum production. Negative for shortness of breath.         Hx of smoking, cough and congestion comes and goes.   Cardiovascular: Negative for chest pain, palpitations and leg swelling.  Gastrointestinal: Negative for abdominal pain, constipation, diarrhea and heartburn.  Genitourinary: Negative for dysuria, frequency and urgency.       Continues to follow with urologist due to bladder cancer- appt today  Musculoskeletal: Negative for back pain, falls and myalgias.  Skin: Negative.   Neurological: Negative for dizziness and headaches.  Endo/Heme/Allergies: Positive for environmental allergies.  Psychiatric/Behavioral: Negative for depression and memory loss. The patient does not have insomnia.     Past Medical History:  Diagnosis Date  . Arthritis   . Bladder cancer [facility]) urologist-  dr Kipp Brood  . Borderline diabetes   . Bursitis of both shoulders   . Diabetes mellitus without complication ([place])   . Disorder of male genital organs, unspecified   . Esophagus disorder    Constricted Esophagus, per New Patient Packet-PSC   . History of basal cell carcinoma (BCC) excision    05/ 2018  right forehead  . Hypertension   . Mild acid reflux no meds  . Nocturia   . OSA (obstructive sleep apnea)    per pt has not used cpap in years  . Productive cough    from smoking  . Renal cyst, left 11/2011   16 mm  . Sleep apnea    Per New Patient Packet-PSC   . Smokers' cough ([place])   .  SOB (shortness of breath) on exertion    Past Surgical History:  Procedure Laterality Date  . CYSTOSCOPY WITH BIOPSY  03/13/2012   Procedure: CYSTOSCOPY WITH BIOPSY;  Surgeon: Fredricka Bonine, MD;  Location: [facility];  Service: Urology;  Laterality: N/A;     . ESOPHAGEAL DILATION  05/20/1986   Esophagus Stretch, per New Patient Packet-PSC   . TONSILLECTOMY  age 48  . TRANSURETHRAL RESECTION OF BLADDER TUMOR N/A 05/23/2017   Procedure: TRANSURETHRAL RESECTION OF BLADDER TUMOR / (TURBT) WITH INSTILLATION OF EPIRUBICIN;  Surgeon: [name], MD;  Location: [facility];  Service: Urology;  Laterality: N/A;  . UVULECTOMY  05/20/1990   Dr.Byers, per new patient packet   . UVULOPALATOPHARYNGOPLASTY  2005   osa   Social History:   reports that he has been smoking cigarettes. He has a 23.50 pack-year smoking history. He has never used smokeless tobacco. He reports current alcohol use. He reports current drug use. Drug: Marijuana.  Family History  Problem Relation Age of Onset  . Diabetes Mother   . Dementia Mother   . Stroke Mother   . Arthritis Mother   . Hypertension Mother   . Multiple sclerosis Father   . Diabetes Father   . Diabetes Sister   . Hypertension Sister   . ADD / ADHD Son   . Migraines Daughter   . ADD / ADHD Daughter   . Cancer Maternal Grandmother     Medications: Patient's Medications  New Prescriptions   TDAP (BOOSTRIX) 5-2.5-18.5 LF-MCG/0.5 INJECTION    Inject 0.5 mLs into the muscle once for 1 dose.  Previous Medications   ACETAMINOPHEN (TYLENOL) 325 MG TABLET    Take 650 mg by mouth every 6 (six) hours as needed.   AMLODIPINE-BENAZEPRIL (LOTREL) 5-20 MG CAPSULE    Take 1 capsule by mouth every morning.   BLOOD GLUCOSE MONITORING SUPPL (ONE TOUCH ULTRA 2) W/DEVICE KIT    1 Device by Does not apply route daily. Dx: E11.65   GLUCOSE BLOOD TEST STRIP    1 each by Other route daily. Dx: E11.65   IBUPROFEN (ADVIL,MOTRIN) 200 MG TABLET    Take 200 mg by mouth every 6 (six) hours as needed.   METFORMIN (GLUCOPHAGE) 1000 MG TABLET    Take 1 tablet (1,000 mg total) by mouth 2 (two) times daily with a meal.  Modified Medications   No medications on file  Discontinued Medications   No medications on file    Physical Exam:  Vitals:   12/21/18 0821  BP: (!) 142/86  Pulse: 97  Temp: 97.8 F (36.6 C)  TempSrc: Oral  SpO2: 96%  Weight: 246 lb (111.6 kg)  Height: 6' (1.829 m)   Body mass index is 33.36 kg/m. Wt Readings from Last 3 Encounters:  12/21/18 246 lb (111.6 kg)   01/29/18 244 lb 9.6 oz (110.9 kg)  01/22/18 243 lb (110.2 kg)    Physical Exam Constitutional:      General: He is not in acute distress.    Appearance: He is well-developed. He is not diaphoretic.  HENT:     Head: Normocephalic and atraumatic.  Neck:     Musculoskeletal: Normal range of motion.  Cardiovascular:     Rate and Rhythm: Normal rate and regular rhythm.     Heart sounds: Normal heart sounds.  Pulmonary:     Effort: Pulmonary effort is normal.     Breath sounds: Decreased breath sounds present.  Abdominal:  General: Bowel sounds are normal.     Palpations: Abdomen is soft.  Skin:    General: Skin is warm and dry.  Neurological:     Mental Status: He is alert and oriented to person, place, and time.     Labs reviewed: Basic Metabolic Panel: Recent Labs    09/22/18 0805  NA 137  K 4.3  CL 103  CO2 26  GLUCOSE 216*  BUN 14  CREATININE 0.83  CALCIUM 9.5   Liver Function Tests: Recent Labs    09/22/18 0805  AST 13  ALT 19  BILITOT 1.1  PROT 6.2   No results for input(s): LIPASE, AMYLASE in the last 8760 hours. No results for input(s): AMMONIA in the last 8760 hours. CBC: Recent Labs    09/22/18 0805  WBC 7.8  NEUTROABS 4,438  HGB 15.9  HCT 46.8  MCV 89.1  PLT 274   Lipid Panel: Recent Labs    09/22/18 0805  CHOL 163  HDL 43  LDLCALC 98  TRIG 123  CHOLHDL 3.8   TSH: No results for input(s): TSH in the last 8760 hours. A1C: Lab Results  Component Value Date   HGBA1C 9.1 (H) 09/22/2018     Assessment/Plan 1. Need for diphtheria-tetanus-pertussis (Tdap) vaccine - Tdap (BOOSTRIX) 5-2.5-18.5 LF-MCG/0.5 injection; Inject 0.5 mLs into the muscle once for 1 dose.  Dispense: 0.5 mL; Refill: 0  2. Body mass index (BMI) of 33.0-33.9 in adult Noted today  3. Class 1 obesity due to excess calories with serious comorbidity and body mass index (BMI) of 33.0 to 33.9 in adult Noted, encourage weight loss with exercise and diet  modifications.  4. Type 2 diabetes mellitus with hyperglycemia, without long-term current use of insulin ([facility]) -does not have blood sugar log but states they are staying around 140 and he gets upset when they are over 150. He is not due for hgb a1c but will come back into the office and get when back in town.  -Encouraged and stressed importance of dietary compliance, routine foot care/monitoring and to keep up with diabetic eye exams through ophthalmology  - glucose blood test strip; 1 each by Other route daily. Dx: E11.65  Dispense: 100 each; Refill: 11 - COMPLETE METABOLIC PANEL WITH GFR; Future - Hemoglobin A1c; Future  5. Essential hypertension -elevated today, pt states he has a fear of blood pressure being cuff which makes it higher. Encouraged to take at home after medication and sitting. To report back if blood pressure staying over 140/90.  6. Smoker -has quit date set, has continued to cut back  7. Hx of bladder cancer -follow up with urology scheduled for today.  8. Primary osteoarthritis, unspecified site -stable, uses tylenol as needed   9. Need for zoster vaccine - Zoster Vaccine Adjuvanted [facility]) injection; Inject 0.5 mLs into the muscle once for 1 dose.  Dispense: 0.5 mL; Refill: 1  10. Need for 23-polyvalent pneumococcal polysaccharide vaccine - Pneumococcal polysaccharide vaccine 23-valent greater than or equal to 2yo subcutaneous/IM   Next appt: 3 months.  Carlos American. [place], [facility] [phone]

## 2018-12-21 NOTE — Patient Instructions (Signed)
To make appt in 2 weeks for LABS  To follow up in 4 months for routine follow up  To take blood pressure at home with cuff. If blood pressure is staying above 140/90 with these readings over several reading to notify the office  DASH Eating Plan Wise stands for "Dietary Approaches to Stop Hypertension." The DASH eating plan is a healthy eating plan that has been shown to reduce high blood pressure (hypertension). It may also reduce your risk for type 2 diabetes, heart disease, and stroke. The DASH eating plan may also help with weight loss. What are tips for following this plan?  General guidelines  Avoid eating more than 2,300 mg (milligrams) of salt (sodium) a day. If you have hypertension, you may need to reduce your sodium intake to 1,500 mg a day.  Limit alcohol intake to no more than 1 drink a day for nonpregnant women and 2 drinks a day for men. One drink equals 12 oz of beer, 5 oz of wine, or 1 oz of hard liquor.  Work with your health care provider to maintain a healthy body weight or to lose weight. Ask what an ideal weight is for you.  Get at least 30 minutes of exercise that causes your heart to beat faster (aerobic exercise) most days of the week. Activities may include walking, swimming, or biking.  Work with your health care provider or diet and nutrition specialist (dietitian) to adjust your eating plan to your individual calorie needs. Reading food labels   Check food labels for the amount of sodium per serving. Choose foods with less than 5 percent of the Daily Value of sodium. Generally, foods with less than 300 mg of sodium per serving fit into this eating plan.  To find whole grains, look for the word "whole" as the first word in the ingredient list. Shopping  Buy products labeled as "low-sodium" or "no salt added."  Buy fresh foods. Avoid canned foods and premade or frozen meals. Cooking  Avoid adding salt when cooking. Use salt-free seasonings or herbs  instead of table salt or sea salt. Check with your health care provider or pharmacist before using salt substitutes.  Do not fry foods. Cook foods using healthy methods such as baking, boiling, grilling, and broiling instead.  Cook with heart-healthy oils, such as olive, canola, soybean, or sunflower oil. Meal planning  Eat a balanced diet that includes: ? 5 or more servings of fruits and vegetables each day. At each meal, try to fill half of your plate with fruits and vegetables. ? Up to 6-8 servings of whole grains each day. ? Less than 6 oz of lean meat, poultry, or fish each day. A 3-oz serving of meat is about the same size as a deck of cards. One egg equals 1 oz. ? 2 servings of low-fat dairy each day. ? A serving of nuts, seeds, or beans 5 times each week. ? Heart-healthy fats. Healthy fats called Omega-3 fatty acids are found in foods such as flaxseeds and coldwater fish, like sardines, salmon, and mackerel.  Limit how much you eat of the following: ? Canned or prepackaged foods. ? Food that is high in trans fat, such as fried foods. ? Food that is high in saturated fat, such as fatty meat. ? Sweets, desserts, sugary drinks, and other foods with added sugar. ? Full-fat dairy products.  Do not salt foods before eating.  Try to eat at least 2 vegetarian meals each week.  Eat more home-cooked  food and less restaurant, buffet, and fast food.  When eating at a restaurant, ask that your food be prepared with less salt or no salt, if possible. What foods are recommended? The items listed may not be a complete list. Talk with your dietitian about what dietary choices are best for you. Grains Whole-grain or whole-wheat bread. Whole-grain or whole-wheat pasta. Brown rice. Modena Morrow. Bulgur. Whole-grain and low-sodium cereals. Pita bread. Low-fat, low-sodium crackers. Whole-wheat flour tortillas. Vegetables Fresh or frozen vegetables (raw, steamed, roasted, or grilled).  Low-sodium or reduced-sodium tomato and vegetable juice. Low-sodium or reduced-sodium tomato sauce and tomato paste. Low-sodium or reduced-sodium canned vegetables. Fruits All fresh, dried, or frozen fruit. Canned fruit in natural juice (without added sugar). Meat and other protein foods Skinless chicken or [place]. Ground chicken or [place]. Pork with fat trimmed off. Fish and seafood. Egg whites. Dried beans, peas, or lentils. Unsalted nuts, nut butters, and seeds. Unsalted canned beans. Lean cuts of beef with fat trimmed off. Low-sodium, lean deli meat. Dairy Low-fat (1%) or fat-free (skim) milk. Fat-free, low-fat, or reduced-fat cheeses. Nonfat, low-sodium ricotta or cottage cheese. Low-fat or nonfat yogurt. Low-fat, low-sodium cheese. Fats and oils Soft margarine without trans fats. Vegetable oil. Low-fat, reduced-fat, or light mayonnaise and salad dressings (reduced-sodium). Canola, safflower, olive, soybean, and sunflower oils. Avocado. Seasoning and other foods Herbs. Spices. Seasoning mixes without salt. Unsalted popcorn and pretzels. Fat-free sweets. What foods are not recommended? The items listed may not be a complete list. Talk with your dietitian about what dietary choices are best for you. Grains Baked goods made with fat, such as croissants, muffins, or some breads. Dry pasta or rice meal packs. Vegetables Creamed or fried vegetables. Vegetables in a cheese sauce. Regular canned vegetables (not low-sodium or reduced-sodium). Regular canned tomato sauce and paste (not low-sodium or reduced-sodium). Regular tomato and vegetable juice (not low-sodium or reduced-sodium). Angie Fava. Olives. Fruits Canned fruit in a light or heavy syrup. Fried fruit. Fruit in cream or butter sauce. Meat and other protein foods Fatty cuts of meat. Ribs. Fried meat. Berniece Salines. Sausage. Bologna and other processed lunch meats. Salami. Fatback. Hotdogs. Bratwurst. Salted nuts and seeds. Canned beans with added  salt. Canned or smoked fish. Whole eggs or egg yolks. Chicken or [place] with skin. Dairy Whole or 2% milk, cream, and half-and-half. Whole or full-fat cream cheese. Whole-fat or sweetened yogurt. Full-fat cheese. Nondairy creamers. Whipped toppings. Processed cheese and cheese spreads. Fats and oils Butter. Stick margarine. Lard. Shortening. Ghee. Bacon fat. Tropical oils, such as coconut, palm kernel, or palm oil. Seasoning and other foods Salted popcorn and pretzels. Onion salt, garlic salt, seasoned salt, table salt, and sea salt. Worcestershire sauce. Tartar sauce. Barbecue sauce. Teriyaki sauce. Soy sauce, including reduced-sodium. Steak sauce. Canned and packaged gravies. Fish sauce. Oyster sauce. Cocktail sauce. Horseradish that you find on the shelf. Ketchup. Mustard. Meat flavorings and tenderizers. Bouillon cubes. Hot sauce and Tabasco sauce. Premade or packaged marinades. Premade or packaged taco seasonings. Relishes. Regular salad dressings. Where to find more information:  National Heart, Lung, and [place]: https://wilson-eaton.com/  American Heart Association: www.heart.org Summary  The DASH eating plan is a healthy eating plan that has been shown to reduce high blood pressure (hypertension). It may also reduce your risk for type 2 diabetes, heart disease, and stroke.  With the DASH eating plan, you should limit salt (sodium) intake to 2,300 mg a day. If you have hypertension, you may need to reduce your sodium intake to 1,500 mg  a day.  When on the DASH eating plan, aim to eat more fresh fruits and vegetables, whole grains, lean proteins, low-fat dairy, and heart-healthy fats.  Work with your health care provider or diet and nutrition specialist (dietitian) to adjust your eating plan to your individual calorie needs. This information is not intended to replace advice given to you by your health care provider. Make sure you discuss any questions you have with your health care  provider. Document Released: 04/25/2011 Document Revised: 04/18/2017 Document Reviewed: 04/29/2016 Elsevier Patient Education  2020 [employer].

## 2019-01-04 ENCOUNTER — Ambulatory Visit (INDEPENDENT_AMBULATORY_CARE_PROVIDER_SITE_OTHER): Payer: Medicare Other | Admitting: Family

## 2019-01-04 ENCOUNTER — Other Ambulatory Visit: Payer: Self-pay

## 2019-01-04 ENCOUNTER — Other Ambulatory Visit: Payer: Medicare Other

## 2019-01-04 ENCOUNTER — Encounter: Payer: Self-pay | Admitting: Family

## 2019-01-04 VITALS — BP 130/80 | HR 99 | Temp 97.5°F | Resp 18 | Ht 72.0 in | Wt 243.0 lb

## 2019-01-04 DIAGNOSIS — M542 Cervicalgia: Secondary | ICD-10-CM | POA: Diagnosis not present

## 2019-01-04 DIAGNOSIS — E1165 Type 2 diabetes mellitus with hyperglycemia: Secondary | ICD-10-CM | POA: Diagnosis not present

## 2019-01-04 MED ORDER — BACLOFEN 5 MG PO TABS: 10.0000 mg | ORAL_TABLET | Freq: Three times a day (TID) | ORAL | 0 refills | Status: DC

## 2019-01-04 NOTE — Patient Instructions (Signed)

## 2019-01-04 NOTE — Progress Notes (Signed)
Provider: Dinah Ngetich FNP-C  Lauree Chandler, NP  Patient Care Team: Lauree Chandler, NP as PCP - General (Geriatric Medicine) Melissa Montane, MD as Consulting Physician (Otolaryngology) [name], MD as Consulting Physician (Urology)  Extended Emergency Contact Information Primary Emergency Contact: Bolton,Cynthia Address: [address]          [place], [place] [address] Johnnette Litter of [place] Phone: [phone] Work Phone: [phone] Mobile Phone: [phone] Relation: Spouse Secondary Emergency Contact: Lennox Laity Address: [address]           [place], [place] [address] Johnnette Litter of [place] Phone: [phone] Mobile Phone: [phone] Relation: Daughter  Code Status:  Full Code  Goals of care: Advanced Directive information Advanced Directives 12/21/2018  Does Patient Have a Medical Advance Directive? No  Would patient like information on creating a medical advance directive? No - Patient declined     Chief Complaint  Patient presents with  . Acute Visit    neck pain x3 weeks    HPI:  Pt is a 68 y.o. male seen today for an acute visit for evaluation of neck pain x 3 weeks. He states has had neck pain on and off for the past twenty years.Left side of the neck has been stiff unable to turn head to the left side but much better today.He does have pain with his lower back and left shoulder following up with Orthopedic.He states scheduled for left shoulder X-ray today.Patient denies any headache,dizziness,numbness,tingling or weakness of the hand. 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 Arthritis   . Bladder cancer [facility]) urologist-  dr Kipp Brood  . Borderline diabetes   . Bursitis of both shoulders   . Diabetes mellitus without complication ([place])   . Disorder of  male genital organs, unspecified   . Esophagus disorder    Constricted Esophagus, per New Patient Packet-PSC   . History of basal cell carcinoma (BCC) excision    05/ 2018  right forehead  . Hypertension   . Mild acid reflux no meds  . Nocturia   . OSA (obstructive sleep apnea)    per pt has not used cpap in years  . Productive cough    from smoking  . Renal cyst, left 11/2011   16 mm  . Sleep apnea    Per New Patient Packet-PSC   . Smokers' cough ([place])   . SOB (shortness of breath) on exertion    Past Surgical History:  Procedure Laterality Date  . CYSTOSCOPY WITH BIOPSY  03/13/2012   Procedure: CYSTOSCOPY WITH BIOPSY;  Surgeon: Fredricka Bonine, MD;  Location: [facility];  Service: Urology;  Laterality: N/A;     . ESOPHAGEAL DILATION  05/20/1986   Esophagus Stretch, per New Patient Packet-PSC   . TONSILLECTOMY  age 68  . TRANSURETHRAL RESECTION OF BLADDER TUMOR N/A 05/23/2017   Procedure: TRANSURETHRAL RESECTION OF BLADDER TUMOR / (TURBT) WITH INSTILLATION OF EPIRUBICIN;  Surgeon: [name], MD;  Location: [facility];  Service: Urology;  Laterality: N/A;  . UVULECTOMY  05/20/1990   Dr.Byers, per new patient packet   . UVULOPALATOPHARYNGOPLASTY  2005   osa    Allergies  Allergen Reactions  . Chantix [Varenicline Tartrate]     Aggression     Outpatient Encounter Medications as of  01/04/2019  Medication Sig  . acetaminophen (TYLENOL) 325 MG tablet Take 650 mg by mouth every 6 (six) hours as needed.  Marland Kitchen amLODipine-benazepril (LOTREL) 5-20 MG capsule Take 1 capsule by mouth every morning.  Marland Kitchen glucose blood test strip 1 each by Other route daily. Dx: E11.65  . ibuprofen (ADVIL,MOTRIN) 200 MG tablet Take 200 mg by mouth every 6 (six) hours as needed.  . metFORMIN (GLUCOPHAGE) 1000 MG tablet Take 1 tablet (1,000 mg total) by mouth 2 (two) times daily with a meal.  . [DISCONTINUED] Blood Glucose Monitoring Suppl (ONE TOUCH ULTRA 2)  w/Device KIT 1 Device by Does not apply route daily. Dx: E11.65   Facility-Administered Encounter Medications as of 01/04/2019  Medication  . epirubicin (ELLENCE) 50 mg in sodium chloride 0.9 % bladder instillation    Review of Systems  Constitutional: Negative for chills, fatigue and fever.  Respiratory: Negative for cough, chest tightness, shortness of breath and wheezing.   Cardiovascular: Negative for chest pain, palpitations and leg swelling.  Musculoskeletal: Positive for arthralgias, back pain, neck pain and neck stiffness. Negative for joint swelling.  Skin: Negative for color change, pallor and rash.  Neurological: Negative for dizziness, weakness, light-headedness, numbness and headaches.    Immunization History  Administered Date(s) Administered  . Pneumococcal Conjugate-13 09/16/2017  . Pneumococcal Polysaccharide-23 12/21/2018   Pertinent  Health Maintenance Due  Topic Date Due  . OPHTHALMOLOGY EXAM  06[birth year]09/1961  . INFLUENZA VACCINE  12/19/2018  . HEMOGLOBIN A1C  03/25/2019  . FOOT EXAM  12/21/2019  . PNA vac Low Risk Adult  Completed   Fall Risk  01/04/2019 09/18/2018 01/29/2018 11/04/2017 10/15/2017  Falls in the past year? 0 1 Yes No Yes  Number falls in past yr: 0 1 1 - 2 or more  Injury with Fall? 0 0 No - No    Vitals:   01/04/19 0928  BP: 130/80  Pulse: 99  Temp: (!) 97.5 F (36.4 C)  TempSrc: Oral  SpO2: 97%  Weight: 243 lb (110.2 kg)   Body mass index is 32.96 kg/m. Physical Exam Constitutional:      General: He is not in acute distress.    Appearance: He is obese.  Eyes:     General: No scleral icterus.       Right eye: No discharge.        Left eye: No discharge.     Extraocular Movements: Extraocular movements intact.     Pupils: Pupils are equal, round, and reactive to light.  Neck:     Musculoskeletal: Decreased range of motion. No edema, erythema, crepitus, pain with movement, spinous process tenderness or muscular tenderness.      Vascular: No carotid bruit.  Lymphadenopathy:     Cervical: No cervical adenopathy.  Neurological:     Mental Status: He is alert.    Labs reviewed: Recent Labs    09/22/18 0805  NA 137  K 4.3  CL 103  CO2 26  GLUCOSE 216*  BUN 14  CREATININE 0.83  CALCIUM 9.5   Recent Labs    09/22/18 0805  AST 13  ALT 19  BILITOT 1.1  PROT 6.2   Recent Labs    09/22/18 0805  WBC 7.8  NEUTROABS 4,438  HGB 15.9  HCT 46.8  MCV 89.1  PLT 274   No results found for: TSH Lab Results  Component Value Date   HGBA1C 9.1 (H) 09/22/2018   Lab Results  Component Value Date   CHOL 163  09/22/2018   HDL 43 09/22/2018   LDLCALC 98 09/22/2018   TRIG 123 09/22/2018   CHOLHDL 3.8 09/22/2018    Significant Diagnostic Results in last 30 days:  No results found.  Assessment/Plan   Neck pain on left side Neck stiffness worst turning towards the left side.Negative for numbness,tingling or weakness of arm.Muscle tightness noted on exam.No tenderness noted. Continue on Ibuprofen for pain.Add baclofen 5 mg tablet one by mouth three times daily for muscle stiffness.Recommended imaging of the cervical but patient states has another imaging of the left shoulder he will notify Orthopedic to add X-ray of the neck too.Follow up with [facility] as directed.Additional education  information provided on AVS.    Family/ staff Communication: Reviewed plan of care with patient  Labs/tests ordered: None   Dinah C Ngetich, NP

## 2019-01-05 LAB — HEMOGLOBIN A1C
Hgb A1c MFr Bld: 7.7 % of total Hgb — ABNORMAL HIGH (ref <5.7)
Mean Plasma Glucose: 174 (calc)
eAG (mmol/L): 9.7 (calc)

## 2019-01-05 LAB — COMPLETE METABOLIC PANEL WITH GFR
AG Ratio: 2.6 (calc) — ABNORMAL HIGH (ref 1.0–2.5)
ALT: 22 U/L (ref 9–46)
AST: 13 U/L (ref 10–35)
Albumin: 4.5 g/dL (ref 3.6–5.1)
Alkaline phosphatase (APISO): 45 U/L (ref 35–144)
BUN: 15 mg/dL (ref 7–25)
CO2: 30 mmol/L (ref 20–32)
Calcium: 10.1 mg/dL (ref 8.6–10.3)
Chloride: 103 mmol/L (ref 98–110)
Creat: 0.82 mg/dL (ref 0.70–1.25)
GFR, Est African American: 106 mL/min/{1.73_m2} (ref >=60)
GFR, Est Non African American: 92 mL/min/{1.73_m2} (ref >=60)
Globulin: 1.7 g/dL (calc) — ABNORMAL LOW (ref 1.9–3.7)
Glucose, Bld: 156 mg/dL — ABNORMAL HIGH (ref 65–99)
Potassium: 4.6 mmol/L (ref 3.5–5.3)
Sodium: 140 mmol/L (ref 135–146)
Total Bilirubin: 1.5 mg/dL — ABNORMAL HIGH (ref 0.2–1.2)
Total Protein: 6.2 g/dL (ref 6.1–8.1)

## 2019-01-12 ENCOUNTER — Telehealth: Payer: Self-pay | Admitting: Nurse Practitioner

## 2019-01-12 ENCOUNTER — Other Ambulatory Visit: Payer: Self-pay | Admitting: Nurse Practitioner

## 2019-01-12 MED ORDER — AMLODIPINE BESY-BENAZEPRIL HCL 5-20 MG PO CAPS: 1.0000 | ORAL_CAPSULE | Freq: Every morning | ORAL | 0 refills | Status: DC

## 2019-01-12 NOTE — Telephone Encounter (Signed)
Patient request prescription for blood pressure medicine to be called into [facility] in [place], [place]

## 2019-01-12 NOTE — Telephone Encounter (Signed)
Patient requested refill Faxed to pharmacy.  

## 2019-02-04 DIAGNOSIS — M7542 Impingement syndrome of left shoulder: Secondary | ICD-10-CM | POA: Diagnosis not present

## 2019-02-04 DIAGNOSIS — M25512 Pain in left shoulder: Secondary | ICD-10-CM | POA: Diagnosis not present

## 2019-02-15 ENCOUNTER — Encounter: Payer: Self-pay | Admitting: Nurse Practitioner

## 2019-02-23 DIAGNOSIS — M25512 Pain in left shoulder: Secondary | ICD-10-CM | POA: Diagnosis not present

## 2019-02-25 DIAGNOSIS — M7542 Impingement syndrome of left shoulder: Secondary | ICD-10-CM | POA: Diagnosis not present

## 2019-02-25 DIAGNOSIS — M25512 Pain in left shoulder: Secondary | ICD-10-CM | POA: Diagnosis not present

## 2019-04-09 ENCOUNTER — Other Ambulatory Visit: Payer: Self-pay

## 2019-04-09 ENCOUNTER — Encounter: Payer: Self-pay | Admitting: Family

## 2019-04-09 ENCOUNTER — Ambulatory Visit (INDEPENDENT_AMBULATORY_CARE_PROVIDER_SITE_OTHER): Payer: Medicare Other | Admitting: Family

## 2019-04-09 DIAGNOSIS — J209 Acute bronchitis, unspecified: Secondary | ICD-10-CM

## 2019-04-09 MED ORDER — ALBUTEROL SULFATE HFA 108 (90 BASE) MCG/ACT IN AERS: 2.0000 | INHALATION_SPRAY | Freq: Four times a day (QID) | RESPIRATORY_TRACT | 1 refills | Status: DC | PRN

## 2019-04-09 MED ORDER — AZITHROMYCIN 250 MG PO TABS: ORAL_TABLET | ORAL | 0 refills | Status: DC

## 2019-04-09 NOTE — Progress Notes (Signed)
This service is provided via telemedicine  No vital signs collected/recorded due to the encounter was a telemedicine visit.   Location of patient (ex: home, work): Home  Patient consents to a telephone visit:  Yes  Location of the provider (ex: office, home):  Office   Name of any referring provider:  Sherrie Mustache, NP   Names of all persons participating in the telemedicine service and their role in the encounter:  Marlowe Sax, NP, Ruthell Rummage CMA, Marguerita Beards  Time spent on call:  Ruthell Rummage CMA spent 13 minutes on phone with patient.   Provider: Nevan Creighton FNP-C  Lauree Chandler, NP  Patient Care Team: Lauree Chandler, NP as PCP - General (Geriatric Medicine) Melissa Montane, MD as Consulting Physician (Otolaryngology) [name], MD as Consulting Physician (Urology)  Extended Emergency Contact Information Primary Emergency Contact: Bolton,Cynthia Address: [address]          [place], [place] [address] Johnnette Litter of [place] Phone: [phone] Work Phone: [phone] Mobile Phone: [phone] Relation: Spouse Secondary Emergency Contact: Lennox Laity Address: [address]           [place], [place] [address] Johnnette Litter of [place] Phone: [phone] Mobile Phone: [phone] Relation: Daughter  Code Status:  Full Code  Goals of care: Advanced Directive information Advanced Directives 12/21/2018  Does Patient Have a Medical Advance Directive? No  Would patient like information on creating a medical advance directive? No - Patient declined     Chief Complaint  Patient presents with  . Acute Visit    Patient is complaining of shortness of breath. Patient states a couple days ago he woke up struggling to breath and states  he is using daughters albuterol inhaler. Patient states he also starting coughing. Patient also c/o of burning in throat and since yesterday.   . Quality Metric Gaps    patient refused flu shot     HPI:   Pt is a 68 y.o. male seen today for an acute visit for evaluation of  shortness of breath  X several days.He states woke up a couple of days ago struggling to breath.He used daughter's albuterol inhaler with much improvement but wheezing returns when albuterol wears off.He has a cough,runny nose and burning in the throat.He describes cough as non-productive.The burning in the throat has been going on since yesterday.He checks his temperature since daughter is a CNA and has had no fever.He denies being in contact with sick person with COVID-19 though he states does not wear his mask all the time when going out of the house.He states he usually carries the mask just in case he is in a crowd then he wears it.Discussed with patient need to get tested for COVID-19 but he states does not believe in those testing.He states "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  He states usually gets bronchitis during this time of the year.He response well to Z-pak.     Past Medical History:  Diagnosis Date  . Arthritis   . Bladder cancer [facility]) urologist-  dr Kipp Brood  . Borderline diabetes   . Bursitis of both shoulders   . Diabetes mellitus without complication ([place])   . Disorder of male genital organs, unspecified   . Esophagus disorder    Constricted Esophagus, per New Patient Packet-PSC   . History of basal cell carcinoma (BCC) excision    05/ 2018  right forehead  . Hypertension   .  Mild acid reflux no meds  . Nocturia   . OSA (obstructive sleep apnea)    per pt has not used cpap in years  . Productive cough    from smoking  . Renal cyst, left 11/2011   16 mm  . Sleep apnea    Per New Patient Packet-PSC   . Smokers' cough ([place])   . SOB (shortness of breath) on exertion    Past Surgical History:  Procedure Laterality Date  . CYSTOSCOPY WITH BIOPSY  03/13/2012   Procedure: CYSTOSCOPY WITH BIOPSY;  Surgeon: Fredricka Bonine, MD;  Location:  [facility];  Service: Urology;  Laterality: N/A;     . ESOPHAGEAL DILATION  05/20/1986   Esophagus Stretch, per New Patient Packet-PSC   . TONSILLECTOMY  age 39  . TRANSURETHRAL RESECTION OF BLADDER TUMOR N/A 05/23/2017   Procedure: TRANSURETHRAL RESECTION OF BLADDER TUMOR / (TURBT) WITH INSTILLATION OF EPIRUBICIN;  Surgeon: [name], MD;  Location[facility];  Service: Urology;  Laterality: N/A;  . UVULECTOMY  05/20/1990   Dr.Byers, per new patient packet   . UVULOPALATOPHARYNGOPLASTY  2005   osa    Allergies  Allergen Reactions  . Chantix [Varenicline Tartrate]     Aggression     Outpatient Encounter Medications as of 04/09/2019  Medication Sig  . acetaminophen (TYLENOL) 325 MG tablet Take 650 mg by mouth every 6 (six) hours as needed.  Marland Kitchen amLODipine-benazepril (LOTREL) 5-20 MG capsule Take 1 capsule by mouth every morning.  . Baclofen 5 MG TABS Take 10 mg by mouth 3 (three) times daily as needed.  Marland Kitchen glucose blood test strip 1 each by Other route daily. Dx: E11.65  . ibuprofen (ADVIL,MOTRIN) 200 MG tablet Take 200 mg by mouth every 6 (six) hours as needed.  . metFORMIN (GLUCOPHAGE) 1000 MG tablet Take 1 tablet (1,000 mg total) by mouth 2 (two) times daily with a meal.  . [DISCONTINUED] baclofen 5 MG TABS Take 10 mg by mouth 3 (three) times daily. (Patient taking differently: Take 10 mg by mouth as needed. )   Facility-Administered Encounter Medications as of 04/09/2019  Medication  . epirubicin (ELLENCE) 50 mg in sodium chloride 0.9 % bladder instillation    Review of Systems  Constitutional: Negative for appetite change, chills, fatigue and fever.  HENT: Positive for congestion, rhinorrhea and sore throat. Negative for sinus pressure, sinus pain, sneezing and trouble swallowing.   Respiratory: Positive for cough, shortness of breath and wheezing.   Cardiovascular: Negative for chest pain, palpitations and leg swelling.  Skin: Negative  for color change, pallor and rash.  Neurological: Negative for dizziness, light-headedness and headaches.  Psychiatric/Behavioral: Negative for agitation and sleep disturbance. The patient is not nervous/anxious.     Immunization History  Administered Date(s) Administered  . Pneumococcal Conjugate-13 09/16/2017  . Pneumococcal Polysaccharide-23 12/21/2018   Pertinent  Health Maintenance Due  Topic Date Due  . OPHTHALMOLOGY EXAM  06/09/1960  . INFLUENZA VACCINE  08/18/2019 (Originally 12/19/2018)  . HEMOGLOBIN A1C  07/07/2019  . FOOT EXAM  12/21/2019  . PNA vac Low Risk Adult  Completed   Fall Risk  04/09/2019 01/04/2019 09/18/2018 01/29/2018 11/04/2017  Falls in the past year? 0 0 1 Yes No  Number falls in past yr: 0 0 1 1 -  Injury with Fall? 0 0 0 No -    There were no vitals filed for this visit. There is no height or weight on file to calculate BMI. Physical Exam  Unable to complete on telephone visit.   Labs reviewed: Recent Labs    09/22/18 0805 01/04/19 0908  NA 137 140  K 4.3 4.6  CL 103 103  CO2 26 30  GLUCOSE 216* 156*  BUN 14 15  CREATININE 0.83 0.82  CALCIUM 9.5 10.1   Recent Labs    09/22/18 0805 01/04/19 0908  AST 13 13  ALT 19 22  BILITOT 1.1 1.5*  PROT 6.2 6.2   Recent Labs    09/22/18 0805  WBC 7.8  NEUTROABS 4,438  HGB 15.9  HCT 46.8  MCV 89.1  PLT 274   No results found for: TSH Lab Results  Component Value Date   HGBA1C 7.7 (H) 01/04/2019   Lab Results  Component Value Date   CHOL 163 09/22/2018   HDL 43 09/22/2018   LDLCALC 98 09/22/2018   TRIG 123 09/22/2018   CHOLHDL 3.8 09/22/2018    Significant Diagnostic Results in last 30 days:  No results found.  Assessment/Plan  Acute bronchitis, unspecified organism Reports no fever or chills.No contact with sick person with COVID-19 though daughter is a CNA.He does not wear mask most of the time unless in a crowd.He declines test to be ordered for COVID-19.current smoker though  has cut down to 1 pack per day.will start on Z-pak due to high risk for pneumonia/brochitis with his smoking.  - azithromycin (ZITHROMAX) 250 MG tablet; Take 500 mg ( 2 tablet) by mouth x 1 dose then 250 mg tablet daily for 4 day  Dispense: 6 tablet; Refill: 0 - albuterol (VENTOLIN HFA) 108 (90 Base) MCG/ACT inhaler; Inhale 2 puffs into the lungs every 6 (six) hours as needed for wheezing or shortness of breath.  Dispense: 8 g; Refill: 1  Family/ staff Communication: Reviewed plan of care with patient   Labs/tests ordered: None   Spent 12 minutes of non-face to face with patient   Next appointment: 04/23/2019 for medical management of chronic issues.   Sandrea Hughs, NP

## 2019-04-14 ENCOUNTER — Ambulatory Visit (INDEPENDENT_AMBULATORY_CARE_PROVIDER_SITE_OTHER): Payer: Medicare Other

## 2019-04-14 ENCOUNTER — Inpatient Hospital Stay (HOSPITAL_COMMUNITY)
Admission: EM | Admit: 2019-04-14 | Discharge: 2019-04-18 | DRG: 286 | Disposition: A | Discharge status: Home / Self Care | Payer: Medicare Other | Attending: Student | Admitting: Student

## 2019-04-14 ENCOUNTER — Encounter (HOSPITAL_COMMUNITY): Payer: Self-pay

## 2019-04-14 ENCOUNTER — Other Ambulatory Visit: Payer: Self-pay

## 2019-04-14 ENCOUNTER — Ambulatory Visit (INDEPENDENT_AMBULATORY_CARE_PROVIDER_SITE_OTHER)
Admission: EM | Admit: 2019-04-14 | Discharge: 2019-04-14 | Disposition: A | Discharge status: Home / Self Care | Payer: Medicare Other | Source: Home / Self Care | Attending: Family Medicine | Admitting: Family Medicine

## 2019-04-14 ENCOUNTER — Emergency Department (HOSPITAL_COMMUNITY): Payer: Medicare Other

## 2019-04-14 DIAGNOSIS — Z823 Family history of stroke: Secondary | ICD-10-CM

## 2019-04-14 DIAGNOSIS — Z888 Allergy status to other drugs, medicaments and biological substances status: Secondary | ICD-10-CM

## 2019-04-14 DIAGNOSIS — J9600 Acute respiratory failure, unspecified whether with hypoxia or hypercapnia: Secondary | ICD-10-CM | POA: Diagnosis present

## 2019-04-14 DIAGNOSIS — R05 Cough: Secondary | ICD-10-CM

## 2019-04-14 DIAGNOSIS — J90 Pleural effusion, not elsewhere classified: Secondary | ICD-10-CM

## 2019-04-14 DIAGNOSIS — I110 Hypertensive heart disease with heart failure: Secondary | ICD-10-CM | POA: Diagnosis not present

## 2019-04-14 DIAGNOSIS — I10 Essential (primary) hypertension: Secondary | ICD-10-CM | POA: Diagnosis not present

## 2019-04-14 DIAGNOSIS — Z8261 Family history of arthritis: Secondary | ICD-10-CM

## 2019-04-14 DIAGNOSIS — I272 Pulmonary hypertension, unspecified: Secondary | ICD-10-CM | POA: Diagnosis present

## 2019-04-14 DIAGNOSIS — J810 Acute pulmonary edema: Secondary | ICD-10-CM

## 2019-04-14 DIAGNOSIS — Z809 Family history of malignant neoplasm, unspecified: Secondary | ICD-10-CM

## 2019-04-14 DIAGNOSIS — R0602 Shortness of breath: Secondary | ICD-10-CM | POA: Insufficient documentation

## 2019-04-14 DIAGNOSIS — K219 Gastro-esophageal reflux disease without esophagitis: Secondary | ICD-10-CM | POA: Diagnosis present

## 2019-04-14 DIAGNOSIS — F17210 Nicotine dependence, cigarettes, uncomplicated: Secondary | ICD-10-CM | POA: Diagnosis present

## 2019-04-14 DIAGNOSIS — J441 Chronic obstructive pulmonary disease with (acute) exacerbation: Secondary | ICD-10-CM | POA: Diagnosis not present

## 2019-04-14 DIAGNOSIS — Z79899 Other long term (current) drug therapy: Secondary | ICD-10-CM

## 2019-04-14 DIAGNOSIS — G4733 Obstructive sleep apnea (adult) (pediatric): Secondary | ICD-10-CM | POA: Diagnosis present

## 2019-04-14 DIAGNOSIS — Z8551 Personal history of malignant neoplasm of bladder: Secondary | ICD-10-CM

## 2019-04-14 DIAGNOSIS — Z9119 Patient's noncompliance with other medical treatment and regimen: Secondary | ICD-10-CM

## 2019-04-14 DIAGNOSIS — F17200 Nicotine dependence, unspecified, uncomplicated: Secondary | ICD-10-CM | POA: Diagnosis present

## 2019-04-14 DIAGNOSIS — I2510 Atherosclerotic heart disease of native coronary artery without angina pectoris: Secondary | ICD-10-CM | POA: Diagnosis present

## 2019-04-14 DIAGNOSIS — Z833 Family history of diabetes mellitus: Secondary | ICD-10-CM

## 2019-04-14 DIAGNOSIS — E1165 Type 2 diabetes mellitus with hyperglycemia: Secondary | ICD-10-CM

## 2019-04-14 DIAGNOSIS — T380X5A Adverse effect of glucocorticoids and synthetic analogues, initial encounter: Secondary | ICD-10-CM | POA: Diagnosis present

## 2019-04-14 DIAGNOSIS — N189 Chronic kidney disease, unspecified: Secondary | ICD-10-CM | POA: Diagnosis present

## 2019-04-14 DIAGNOSIS — I5021 Acute systolic (congestive) heart failure: Secondary | ICD-10-CM | POA: Diagnosis not present

## 2019-04-14 DIAGNOSIS — I428 Other cardiomyopathies: Secondary | ICD-10-CM | POA: Diagnosis present

## 2019-04-14 DIAGNOSIS — E785 Hyperlipidemia, unspecified: Secondary | ICD-10-CM | POA: Diagnosis present

## 2019-04-14 DIAGNOSIS — Z85828 Personal history of other malignant neoplasm of skin: Secondary | ICD-10-CM

## 2019-04-14 DIAGNOSIS — Z820 Family history of epilepsy and other diseases of the nervous system: Secondary | ICD-10-CM

## 2019-04-14 DIAGNOSIS — Z20828 Contact with and (suspected) exposure to other viral communicable diseases: Secondary | ICD-10-CM | POA: Diagnosis present

## 2019-04-14 DIAGNOSIS — Z8249 Family history of ischemic heart disease and other diseases of the circulatory system: Secondary | ICD-10-CM

## 2019-04-14 DIAGNOSIS — E041 Nontoxic single thyroid nodule: Secondary | ICD-10-CM | POA: Diagnosis not present

## 2019-04-14 DIAGNOSIS — I472 Ventricular tachycardia: Secondary | ICD-10-CM | POA: Diagnosis not present

## 2019-04-14 DIAGNOSIS — Z7984 Long term (current) use of oral hypoglycemic drugs: Secondary | ICD-10-CM

## 2019-04-14 DIAGNOSIS — J9601 Acute respiratory failure with hypoxia: Secondary | ICD-10-CM | POA: Diagnosis present

## 2019-04-14 DIAGNOSIS — M1990 Unspecified osteoarthritis, unspecified site: Secondary | ICD-10-CM | POA: Diagnosis present

## 2019-04-14 DIAGNOSIS — F419 Anxiety disorder, unspecified: Secondary | ICD-10-CM | POA: Diagnosis present

## 2019-04-14 DIAGNOSIS — Z720 Tobacco use: Secondary | ICD-10-CM | POA: Diagnosis present

## 2019-04-14 LAB — CBC
HCT: 46.8 % (ref 39.0–52.0)
Hemoglobin: 15.5 g/dL (ref 13.0–17.0)
MCH: 29.9 pg (ref 26.0–34.0)
MCHC: 33.1 g/dL (ref 30.0–36.0)
MCV: 90.2 fL (ref 80.0–100.0)
Platelets: 366 10*3/uL (ref 150–400)
RBC: 5.19 MIL/uL (ref 4.22–5.81)
RDW: 11.9 % (ref 11.5–15.5)
WBC: 10.4 10*3/uL (ref 4.0–10.5)
nRBC: 0 % (ref 0.0–0.2)

## 2019-04-14 LAB — BASIC METABOLIC PANEL
Anion gap: 10 (ref 5–15)
Anion gap: 11 (ref 5–15)
BUN: 10 mg/dL (ref 8–23)
BUN: 10 mg/dL (ref 8–23)
CO2: 25 mmol/L (ref 22–32)
CO2: 25 mmol/L (ref 22–32)
Calcium: 9.2 mg/dL (ref 8.9–10.3)
Calcium: 9.6 mg/dL (ref 8.9–10.3)
Chloride: 102 mmol/L (ref 98–111)
Chloride: 103 mmol/L (ref 98–111)
Creatinine, Ser: 0.99 mg/dL (ref 0.61–1.24)
Creatinine, Ser: 1 mg/dL (ref 0.61–1.24)
GFR calc Af Amer: >60 mL/min (ref >60)
GFR calc Af Amer: >60 mL/min (ref >60)
GFR calc non Af Amer: >60 mL/min (ref >60)
GFR calc non Af Amer: >60 mL/min (ref >60)
Glucose, Bld: 124 mg/dL — ABNORMAL HIGH (ref 70–99)
Glucose, Bld: 139 mg/dL — ABNORMAL HIGH (ref 70–99)
Potassium: 4.1 mmol/L (ref 3.5–5.1)
Potassium: 4.1 mmol/L (ref 3.5–5.1)
Sodium: 138 mmol/L (ref 135–145)
Sodium: 138 mmol/L (ref 135–145)

## 2019-04-14 LAB — POC SARS CORONAVIRUS 2 AG -  ED: SARS Coronavirus 2 Ag: NEGATIVE

## 2019-04-14 LAB — BRAIN NATRIURETIC PEPTIDE: B Natriuretic Peptide: 288 pg/mL — ABNORMAL HIGH (ref 0.0–100.0)

## 2019-04-14 MED ORDER — NICOTINE 7 MG/24HR TD PT24
7.0000 mg | MEDICATED_PATCH | Freq: Once | TRANSDERMAL | Status: DC
  Administered 2019-04-14: 7 mg via TRANSDERMAL
  Filled 2019-04-14: qty 1

## 2019-04-14 MED ORDER — FUROSEMIDE 20 MG PO TABS: 20.0000 mg | ORAL_TABLET | Freq: Every day | ORAL | 0 refills | Status: DC

## 2019-04-14 MED ORDER — IOHEXOL 350 MG/ML SOLN
100.0000 mL | Freq: Once | INTRAVENOUS | Status: AC | PRN
  Administered 2019-04-14: 100 mL via INTRAVENOUS

## 2019-04-14 MED ORDER — FUROSEMIDE 10 MG/ML IJ SOLN
40.0000 mg | Freq: Once | INTRAMUSCULAR | Status: AC
  Administered 2019-04-14: 40 mg via INTRAVENOUS
  Filled 2019-04-14: qty 4

## 2019-04-14 MED ORDER — IPRATROPIUM-ALBUTEROL 0.5-2.5 (3) MG/3ML IN SOLN
6.0000 mL | Freq: Once | RESPIRATORY_TRACT | Status: AC
  Administered 2019-04-14: 6 mL via RESPIRATORY_TRACT
  Filled 2019-04-14: qty 3

## 2019-04-14 NOTE — ED Triage Notes (Signed)
Pt sent here from UC for further evaluation of SOB that has been going on for 1 week. Pt just finished up a zpak and has been using an albuterol inhaler with some relief. Pt states he smokes a pack of cigarettes over a course of 4 days. Tachypnea noted in triage.

## 2019-04-14 NOTE — ED Notes (Signed)
Pt pulse oximetry checked while ambulating. Pt ambulated for 2 minutes. Pt became SOB at 1.5 minutes and O2 dropped to 89% at lowest. At rest, pt's O2 retrieved back to 94%

## 2019-04-14 NOTE — ED Provider Notes (Signed)
[facility] EMERGENCY DEPARTMENT Provider Note   CSN: [id number] Arrival date & time: 04/14/19  1713     History   Chief Complaint Chief Complaint  Patient presents with  . Shortness of Breath    HPI Timothy Lee is a 68 y.o. male.     68 y.o male with a PMH of Bladder CA, HTN, Bronchitis presents to the ED with a chief complaint of shortness of breath. Patients symptoms began last 6 days ago, he had a Televisit with his PCP who prescribed him a Zpack.He reports his symptoms have not improved, his shortness of breath happens while sitting and ambulating. He also endorses a cough with green to yellow sputum. He has been using his inhaler without improvement. He was noted to desat while ambulating at urgent care to 89% on room air.  He does not wear any oxygen while at home.  He does have a 60-year smoking history, reports he has been cutting down recently.  He denies any fevers, prior history of heart failure, prior history of blood clots.  Denies any chest pain on today's visit.  The history is provided by the patient and medical records.  Shortness of Breath Associated symptoms: no abdominal pain, no chest pain, no cough, no ear pain, no fever, no rash, no sore throat and no vomiting     Past Medical History:  Diagnosis Date  . Arthritis   . Bladder cancer [facility]) urologist-  dr Kipp Brood  . Borderline diabetes   . Bursitis of both shoulders   . Diabetes mellitus without complication ([place])   . Disorder of male genital organs, unspecified   . Esophagus disorder    Constricted Esophagus, per New Patient Packet-PSC   . History of basal cell carcinoma (BCC) excision    05/ 2018  right forehead  . Hypertension   . Mild acid reflux no meds  . Nocturia   . OSA (obstructive sleep apnea)    per pt has not used cpap in years  . Productive cough    from smoking  . Renal cyst, left 11/2011   16 mm  . Sleep apnea    Per New Patient Packet-PSC   . Smokers'  cough ([place])   . SOB (shortness of breath) on exertion     Patient Active Problem List   Diagnosis Date Noted  . Type 2 diabetes mellitus with hyperglycemia, without long-term current use of insulin ([place]) 09/16/2017  . Essential hypertension 08/19/2017  . Acute bronchitis 08/19/2017  . OSA (obstructive sleep apnea) 08/19/2017  . Hx of bladder cancer 08/19/2017  . Smoker 08/19/2017    Past Surgical History:  Procedure Laterality Date  . CYSTOSCOPY WITH BIOPSY  03/13/2012   Procedure: CYSTOSCOPY WITH BIOPSY;  Surgeon: Fredricka Bonine, MD;  Location: [facility];  Service: Urology;  Laterality: N/A;     . ESOPHAGEAL DILATION  05/20/1986   Esophagus Stretch, per New Patient Packet-PSC   . TONSILLECTOMY  age 59  . TRANSURETHRAL RESECTION OF BLADDER TUMOR N/A 05/23/2017   Procedure: TRANSURETHRAL RESECTION OF BLADDER TUMOR / (TURBT) WITH INSTILLATION OF EPIRUBICIN;  Surgeon: [name], MD;  Location: [facility];  Service: Urology;  Laterality: N/A;  . UVULECTOMY  05/20/1990   Dr.Byers, per new patient packet   . UVULOPALATOPHARYNGOPLASTY  2005   osa        Home Medications    Prior to Admission medications   Medication Sig Start Date End Date Taking?  Authorizing Provider  acetaminophen (TYLENOL) 325 MG tablet Take 650 mg by mouth every 6 (six) hours as needed for moderate pain.     [provider]  albuterol (VENTOLIN HFA) 108 (90 Base) MCG/ACT inhaler Inhale 2 puffs into the lungs every 6 (six) hours as needed for wheezing or shortness of breath. 04/09/19   Ngetich, Dinah C, NP  amLODipine-benazepril (LOTREL) 5-20 MG capsule Take 1 capsule by mouth every morning. 01/12/19   Lauree Chandler, NP  azithromycin (ZITHROMAX) 250 MG tablet Take 500 mg ( 2 tablet) by mouth x 1 dose then 250 mg tablet daily for 4 day 04/09/19   Ngetich, Dinah C, NP  Baclofen 5 MG TABS Take 10 mg by mouth 3 (three) times daily as needed (pain).      [provider]  furosemide (LASIX) 20 MG tablet Take 1 tablet (20 mg total) by mouth daily for 7 days. 04/14/19 04/21/19  Augusto Gamble B, NP  glucose blood test strip 1 each by Other route daily. Dx: E11.65 12/21/18   Lauree Chandler, NP  ibuprofen (ADVIL,MOTRIN) 200 MG tablet Take 200 mg by mouth every 6 (six) hours as needed for mild pain.     [provider]  metFORMIN (GLUCOPHAGE) 1000 MG tablet Take 1 tablet (1,000 mg total) by mouth 2 (two) times daily with a meal. 09/28/18   Lauree Chandler, NP    Family History Family History  Problem Relation Age of Onset  . Diabetes Mother   . Dementia Mother   . Stroke Mother   . Arthritis Mother   . Hypertension Mother   . Multiple sclerosis Father   . Diabetes Father   . Diabetes Sister   . Hypertension Sister   . ADD / ADHD Son   . Migraines Daughter   . ADD / ADHD Daughter   . Cancer Maternal Grandmother     Social History Social History   Tobacco Use  . Smoking status: Current Every Day Smoker    Packs/day: 0.10    Years: 47.00    Pack years: 4.70    Types: Cigarettes  . Smokeless tobacco: Never Used  . Tobacco comment: cutting back  Substance Use Topics  . Alcohol use: Yes    Comment: very seldom- rare  . Drug use: Yes    Types: Marijuana    Comment: seldom-rare     Allergies   Chantix [varenicline tartrate]   Review of Systems Review of Systems  Constitutional: Negative for chills and fever.  HENT: Negative for ear pain and sore throat.   Eyes: Negative for pain and visual disturbance.  Respiratory: Positive for shortness of breath. Negative for cough.   Cardiovascular: Positive for leg swelling. Negative for chest pain and palpitations.  Gastrointestinal: Negative for abdominal pain and vomiting.  Genitourinary: Negative for dysuria and hematuria.  Musculoskeletal: Negative for arthralgias and back pain.  Skin: Negative for color change and rash.  Neurological: Negative for seizures  and syncope.  All other systems reviewed and are negative.    Physical Exam Updated Vital Signs BP 130/84   Pulse (!) 109   Temp 98.5 F (36.9 C) (Oral)   Resp 18   Ht 6' (1.829 m)   Wt 110.2 kg   SpO2 95%   BMI 32.96 kg/m   Physical Exam Vitals signs and nursing note reviewed.  Constitutional:      Appearance: He is well-developed. He is ill-appearing.  HENT:     Head: Normocephalic and  atraumatic.  Eyes:     General: No scleral icterus.    Pupils: Pupils are equal, round, and reactive to light.  Neck:     Musculoskeletal: Normal range of motion.  Cardiovascular:     Rate and Rhythm: Tachycardia present.     Heart sounds: Normal heart sounds.     Comments: No bilateral pitting edema. Pulmonary:     Effort: Pulmonary effort is normal. Tachypnea present.     Breath sounds: Examination of the right-upper field reveals decreased breath sounds and wheezing. Examination of the left-upper field reveals decreased breath sounds and wheezing. Examination of the right-middle field reveals decreased breath sounds and wheezing. Examination of the left-middle field reveals decreased breath sounds and wheezing. Examination of the right-lower field reveals decreased breath sounds and wheezing. Examination of the left-lower field reveals decreased breath sounds and wheezing. Decreased breath sounds and wheezing present.  Chest:     Chest wall: No tenderness.  Abdominal:     General: Bowel sounds are normal. There is no distension.     Palpations: Abdomen is soft.     Tenderness: There is no abdominal tenderness.  Musculoskeletal:        General: No tenderness or deformity.  Skin:    General: Skin is warm and dry.  Neurological:     Mental Status: He is alert and oriented to person, place, and time.      ED Treatments / Results  Labs (all labs ordered are listed, but only abnormal results are displayed) Labs Reviewed  BASIC METABOLIC PANEL - Abnormal; Notable for the following  components:      Result Value   Glucose, Bld 139 (*)    All other components within normal limits  SARS CORONAVIRUS 2 (TAT 6-24 HRS)  CBC  POC SARS CORONAVIRUS 2 AG -  ED    EKG None  Radiology Dg Chest 2 View  Result Date: 04/14/2019 CLINICAL DATA:  Cough and shortness of breath despite taking azithromycin. Smoker. EXAM: CHEST - 2 VIEW COMPARISON:  Radiograph 09/18/2017 FINDINGS: Mild cardiomegaly. Unchanged aortic tortuosity compared to prior exam. Interstitial thickening most consistent with pulmonary edema. Small bilateral pleural effusions. No confluent airspace disease. No pneumothorax. No acute osseous abnormalities are seen. Mild wedging of lower thoracic vertebra is unchanged. IMPRESSION: Mild cardiomegaly with small pleural effusions. Interstitial thickening suspicious for pulmonary edema. Findings are most consistent with CHF. Electronically Signed   By: Keith Rake [name]   On: 04/14/2019 15:38   Ct Angio Chest Pe W And/or Wo Contrast  Result Date: 04/14/2019 CLINICAL DATA:  Chest pain shortness of breath for 6 days, cough EXAM: CT ANGIOGRAPHY CHEST WITH CONTRAST TECHNIQUE: Multidetector CT imaging of the chest was performed using the standard protocol during bolus administration of intravenous contrast. Multiplanar CT image reconstructions and MIPs were obtained to evaluate the vascular anatomy. CONTRAST:  150mL OMNIPAQUE IOHEXOL 350 MG/ML SOLN COMPARISON:  Chest radiograph 04/04/2019 FINDINGS: Cardiovascular: Satisfactory opacification the pulmonary arteries to the segmental level. No pulmonary artery filling defects are identified. Central pulmonary arteries are normal caliber. No elevation of the RV/LV ratio (0.73). Mild cardiomegaly. Trace pericardial fluid. Coronary artery calcifications are noted. Thoracic aortic caliber is at the upper limits of normal. Minimal atherosclerotic plaque in the aorta. The left vertebral artery arises directly from the aortic arch. Minimal  atherosclerotic plaque in the great vessels. Mediastinum/Nodes: Low-attenuation mediastinal and hilar nodes are present. Several larger low-attenuation nodes include a 12 mm subcarinal lymph node (5/73), a 11 mm  left carinal lymph node (5/61) and a 12 mm right carinal lymph node (5/70). There is a heterogeneously enlarged left lobe thyroid gland. Discrete nodularity difficult to ascertain. Bowing of the trachea compatible with imaging during exhalation. Mild diffuse peribronchovascular thickening is noted. No acute abnormality of the esophagus. Lungs/Pleura: Moderate bilateral effusions with adjacent passive atelectasis in the airless lung. Additional fluid seen tracking into the fissures with adjacent passive atelectasis of the upper lobes as well. Interlobular septal thickening is noted in the lung bases and apices with some central peribronchovascular opacity. Interspersed areas of ground-glass opacity are noted. No pneumothorax. Upper Abdomen: Mild bilateral symmetric perinephric stranding, a nonspecific finding though may correlate with either age or decreased renal function. No acute abnormalities present in the visualized portions of the upper abdomen. Musculoskeletal: T11 anterior wedging is similar to comparison from 2018. Multilevel degenerative changes are present in the imaged portions of the spine. No acute osseous abnormality or suspicious osseous lesion. No suspicious chest wall lesions. Review of the MIP images confirms the above findings. IMPRESSION: 1. No evidence of pulmonary embolism. 2. Moderate bilateral pleural effusions with adjacent passive atelectasis in the airless lung. 3. Interlobular septal thickening in the lung bases and apices with some central peribronchovascular and ground-glass opacity, most suggestive of pulmonary edema. 4. Low-attenuation mediastinal and hilar nodes, likely reactive/edematous. 5. Heterogeneous, enlarged left lobe thyroid gland. Discrete nodularity difficult to  ascertain. Consider further evaluation with thyroid ultrasound. This follows consensus guidelines: Managing Incidental Thyroid Nodules Detected on Imaging: White Paper of the ACR Incidental Thyroid Findings Committee. J Am Coll Radiol 2015; 12:143-150. and [facility] 3-tiered system for managing ITNs: J Am Coll Radiol. 2015; Feb;12(2): 143-50 6. Aortic Atherosclerosis (ICD10-I70.0). 7. Cardiomegaly.  Coronary atherosclerosis. Electronically Signed   By: Lovena Le [name]   On: 04/14/2019 22:54    Procedures Procedures (including critical care time)  Medications Ordered in ED Medications  nicotine (NICODERM CQ - dosed in mg/24 hr) patch 7 mg (7 mg Transdermal Patch Applied 04/14/19 2206)  ipratropium-albuterol (DUONEB) 0.5-2.5 (3) MG/3ML nebulizer solution 6 mL (6 mLs Nebulization Given 04/14/19 2035)  iohexol (OMNIPAQUE) 350 MG/ML injection 100 mL (100 mLs Intravenous Contrast Given 04/14/19 2239)     Initial Impression / Assessment and Plan / ED Course  I have reviewed the triage vital signs and the nursing notes.  Pertinent labs & imaging results that were available during my care of the patient were reviewed by me and considered in my medical decision making (see chart for details).       With a past medical history of diabetes, hiatus, 60-year smoking pack sent to the ED with complaints of shortness of breath.  Seen in urgent care today, desatted to 89% while ambulating with his pulse ox.  He reports he is for shoulder press for the past 2 weeks.  He did complete a 7-day course of azithromycin which was prescribed by his PCP.  He reports no improvement in symptoms, he does have a green phlegm which she reports he is unable to cough up.      In the ED tachycardic, hypoxic with an oxygen of 92%, he was placed by me on 2 L of O2 bumping him up to 97%.  He is persistently tachycardic in the ED, last uses albuterol inhaler at noon, prior to leaving the room patient used his inhaler again.  He is  tachypneic.  Denies any prior history of heart failure.  BMP without any electrolyte derangement, does have a slight  elevated glucose but he is diabetic.  CBC without any leukocytosis.  X-ray obtained at urgent care showed: Mild cardiomegaly with small pleural effusions. Interstitial thickening suspicious for pulmonary edema. Findings are most consistent with CHF.  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 According to PCP chart, he does not wear his mask while in public.   BMP is remarkable for a level of 288, he does not have any prior history of CHF.  Discussed risks and benefits of obtaining a CT angios I cannot rule out pulmonary embolism with patient's tachycardia, hypoxia, prior history of heavy smoking.  CT Angio showed:  1. No evidence of pulmonary embolism.  2. Moderate bilateral pleural effusions with adjacent passive  atelectasis in the airless lung.  3. Interlobular septal thickening in the lung bases and apices with  some central peribronchovascular and ground-glass opacity, most  suggestive of pulmonary edema.  4. Low-attenuation mediastinal and hilar nodes, likely  reactive/edematous.  5. Heterogeneous, enlarged left lobe thyroid gland. Discrete  nodularity difficult to ascertain. Consider further evaluation with  thyroid ultrasound. This follows consensus guidelines: Managing  Incidental Thyroid Nodules Detected on Imaging: White Paper of the  ACR Incidental Thyroid Findings Committee. J Am Coll Radiol 2015;  12:143-150. and [facility] 3-tiered system for managing ITNs: J Am Coll  Radiol. 2015; Feb;12(2): 143-50  6. Aortic Atherosclerosis (ICD10-I70.0).  7. Cardiomegaly. Coronary atherosclerosis.       These results were discussed at length with patient, his oxygen oxygen remains at 95% on 2L of oxygen,  he is currently not on any supplemental oxygen at home.  11:28 PM spoke to hospitalist service, appreciate their help.  Patient will be admitted for further management of his shortness of breath.  Patient understands and agrees with management.   Portions of this note were generated with Lobbyist. Dictation errors may occur despite best attempts at proofreading. Final Clinical Impressions(s) / ED Diagnoses   Final diagnoses:  SOB (shortness of breath)  COPD exacerbation ([place])  Acute pulmonary edema [facility])    ED Discharge Orders    None       Janeece Fitting, PA-C 04/14/19 2328    Wyvonnia Dusky, MD 04/15/19 1053

## 2019-04-14 NOTE — Discharge Instructions (Signed)
You do have changes to your lungs which are concerning today. This is why you are short of breath.  Unfortunately I am unable to answer why you have these changes, here from UC.  I do recommend that you go to the ER for further evaluation.  If you do not go to the ER.  Please start a fluid pill, lasix, which will make you urinate more. This should

## 2019-04-14 NOTE — ED Triage Notes (Signed)
Pt presents to UC w/ c/o cough, sob x6 days. Pt states he has been taking zpak and albuterol inhaler. He states he finished zpac this morning.

## 2019-04-14 NOTE — ED Notes (Signed)
Patient transported to CT 

## 2019-04-14 NOTE — ED Provider Notes (Signed)
Timothy Lee    CSN: [id number] Arrival date & time: 04/14/19  1433      History   Chief Complaint Chief Complaint  Patient presents with   SOB, cough    HPI Timothy Lee is a 68 y.o. male.   Timothy Lee presents with complaints of cough and shortness of breath . Woke with shortness of breath about 6 days ago. Worse with activity. Cough, this is not necessarily new. Has had post nasal drainage, feels like it is worse this year. Has been taking Wal-fed, which hasn't helped. Did a virtual visit with his PCP 5 days ago and was given a z-pack, this hasn't helped. No fevers. No sore throat or ear pain. After coughing his throat feels sore. If he falls asleep in a chair he does get swelling to lower legs, otherwise no consistent swelling. Has had albuterol at home, it does help briefly so he can. History of bronchitis for "at least 30 years." no asthma history. He lives an active lifestyle. Denies any chest pain. Has smoked for 60 years, over the past two months has been weaning his smoking. Smoking approximately 1/4 pack a day currently. He can bring up mucus at times with his cough. No known ill contacts. He buys discontinued merchandise outside of the home, is around others regularly. History  Of dm.     ROS per HPI, negative if not otherwise mentioned.      Past Medical History:  Diagnosis Date   Arthritis    Bladder cancer [facility]) urologist-  dr Kipp Brood   Borderline diabetes    Bursitis of both shoulders    Diabetes mellitus without complication ([place])    Disorder of male genital organs, unspecified    Esophagus disorder    Constricted Esophagus, per New Patient Packet-PSC    History of basal cell carcinoma (BCC) excision    05/ 2018  right forehead   Hypertension    Mild acid reflux no meds   Nocturia    OSA (obstructive sleep apnea)    per pt has not used cpap in years   Productive cough    from smoking   Renal cyst, left 11/2011   16  mm   Sleep apnea    Per New Patient Packet-PSC    Smokers' cough ([place])    SOB (shortness of breath) on exertion     Patient Active Problem List   Diagnosis Date Noted   Type 2 diabetes mellitus with hyperglycemia, without long-term current use of insulin ([place]) 09/16/2017   Essential hypertension 08/19/2017   Acute bronchitis 08/19/2017   OSA (obstructive sleep apnea) 08/19/2017   Hx of bladder cancer 08/19/2017   Smoker 08/19/2017    Past Surgical History:  Procedure Laterality Date   CYSTOSCOPY WITH BIOPSY  03/13/2012   Procedure: CYSTOSCOPY WITH BIOPSY;  Surgeon: Timothy Bonine, MD;  Location: [facility];  Service: Urology;  Laterality: N/A;      ESOPHAGEAL DILATION  05/20/1986   Esophagus Stretch, per New Patient Packet-PSC    TONSILLECTOMY  age 69   TRANSURETHRAL RESECTION OF BLADDER TUMOR N/A 05/23/2017   Procedure: TRANSURETHRAL RESECTION OF BLADDER TUMOR / (TURBT) WITH INSTILLATION OF EPIRUBICIN;  Surgeon: Timothy Aloe, MD;  Location: [facility];  Service: Urology;  Laterality: N/A;   UVULECTOMY  05/20/1990   Dr.Byers, per new patient packet    UVULOPALATOPHARYNGOPLASTY  2005   osa       Home Medications  Prior to Admission medications   Medication Sig Start Date End Date Taking? Authorizing Provider  acetaminophen (TYLENOL) 325 MG tablet Take 650 mg by mouth every 6 (six) hours as needed.    [provider]  albuterol (VENTOLIN HFA) 108 (90 Base) MCG/ACT inhaler Inhale 2 puffs into the lungs every 6 (six) hours as needed for wheezing or shortness of breath. 04/09/19   Ngetich, Dinah C, NP  amLODipine-benazepril (LOTREL) 5-20 MG capsule Take 1 capsule by mouth every morning. 01/12/19   Lauree Chandler, NP  azithromycin (ZITHROMAX) 250 MG tablet Take 500 mg ( 2 tablet) by mouth x 1 dose then 250 mg tablet daily for 4 day 04/09/19   Ngetich, Dinah C, NP  Baclofen 5 MG TABS Take 10 mg by mouth 3  (three) times daily as needed.    [provider]  furosemide (LASIX) 20 MG tablet Take 1 tablet (20 mg total) by mouth daily for 7 days. 04/14/19 04/21/19  Augusto Gamble B, NP  glucose blood test strip 1 each by Other route daily. Dx: E11.65 12/21/18   Lauree Chandler, NP  ibuprofen (ADVIL,MOTRIN) 200 MG tablet Take 200 mg by mouth every 6 (six) hours as needed.    [provider]  metFORMIN (GLUCOPHAGE) 1000 MG tablet Take 1 tablet (1,000 mg total) by mouth 2 (two) times daily with a meal. 09/28/18   Eubanks, Carlos American, NP    Family History Family History  Problem Relation Age of Onset   Diabetes Mother    Dementia Mother    Stroke Mother    Arthritis Mother    Hypertension Mother    Multiple sclerosis Father    Diabetes Father    Diabetes Sister    Hypertension Sister    ADD / ADHD Son    Migraines Daughter    ADD / ADHD Daughter    Cancer Maternal Grandmother     Social History Social History   Tobacco Use   Smoking status: Current Every Day Smoker    Packs/day: 0.10    Years: 47.00    Pack years: 4.70    Types: Cigarettes   Smokeless tobacco: Never Used   Tobacco comment: cutting back  Substance Use Topics   Alcohol use: Yes    Comment: very seldom- rare   Drug use: Yes    Types: Marijuana    Comment: seldom-rare     Allergies   Chantix [varenicline tartrate]   Review of Systems Review of Systems   Physical Exam Triage Vital Signs ED Triage Vitals  Enc Vitals Group     BP 04/14/19 1543 136/86     Pulse Rate 04/14/19 1543 (!) 119     Resp 04/14/19 1543 20     Temp 04/14/19 1543 98.5 F (36.9 C)     Temp Source 04/14/19 1543 Oral     SpO2 04/14/19 1543 95 %     Weight --      Height --      Head Circumference --      Peak Flow --      Pain Score 04/14/19 1549 0     Pain Loc --      Pain Edu? --      Excl. in [place]? --    No data found.  Updated Vital Signs BP 136/86 (BP Location: Right Arm)    Pulse (!)  119    Temp 98.5 F (36.9 C) (Oral)    Resp 20  SpO2 95%    Physical Exam Constitutional:      Appearance: He is well-developed.  Neck:     Vascular: No JVD.  Cardiovascular:     Rate and Rhythm: Regular rhythm. Tachycardia present.  Pulmonary:     Effort: Pulmonary effort is normal. No respiratory distress.     Breath sounds: Rales present.     Comments: Shortness of breath  With speaking, significant shortness of breath with activity noted; rales throughout; strong cough which is productive at times noted  Musculoskeletal:     Right lower leg: No edema.     Left lower leg: No edema.  Skin:    General: Skin is warm and dry.  Neurological:     Mental Status: He is alert and oriented to person, place, and time.    EKG:  Sinus tach with pvc's, rate of 122. Previous EKG was available for review. No stwave changes as interpreted by me.  Reviewed with dr. Sabra Heck  UC Treatments / Results  Labs (all labs ordered are listed, but only abnormal results are displayed) Labs Reviewed  NOVEL CORONAVIRUS, NAA (HOSP ORDER, SEND-OUT TO REF LAB; TAT 18-24 HRS)  [place]    EKG   Radiology Dg Chest 2 View  Result Date: 04/14/2019 CLINICAL DATA:  Cough and shortness of breath despite taking azithromycin. Smoker. EXAM: CHEST - 2 VIEW COMPARISON:  Radiograph 09/18/2017 FINDINGS: Mild cardiomegaly. Unchanged aortic tortuosity compared to prior exam. Interstitial thickening most consistent with pulmonary edema. Small bilateral pleural effusions. No confluent airspace disease. No pneumothorax. No acute osseous abnormalities are seen. Mild wedging of lower thoracic vertebra is unchanged. IMPRESSION: Mild cardiomegaly with small pleural effusions. Interstitial thickening suspicious for pulmonary edema. Findings are most consistent with CHF. Electronically Signed   By: Keith Rake [name]   On: 04/14/2019 15:38    Procedures Procedures (including critical  care time)  Medications Ordered in UC Medications - No data to display  Initial Impression / Assessment and Plan / UC Course  I have reviewed the triage vital signs and the nursing notes.  Pertinent labs & imaging results that were available during my care of the patient were reviewed by me and considered in my medical decision making (see chart for details).     desats with ambulation to 89% on RA. DOE and shortness of breath  At rest. No chest pain . Tachycardia. Chest xray with acute findings, new edema and effusions. Afebrile. New onset chf vs viral illness vs other pathology considered. At this time due to increased work of breathing and new findings with tachycardia I do recommend going to the ER. Patient is very resistant to this. Discussed risks, to include mi and/or death, of not receiving further evaluation. Patient states he will consider it. Lasix sent to the pharmacy if patient does not go to the Er to try to help with symptoms. covid testing collected and pending as well. Strict er and PCP follow up discussed. Patient verbalized understanding of instructions and risks. Ambulatory out of UC  Final Clinical Impressions(s) / UC Diagnoses   Final diagnoses:  SOB (shortness of breath)  Chronic bilateral pleural effusions     Discharge Instructions     You do have changes to your lungs which are concerning today. This is why you are short of breath.  Unfortunately I am unable to answer why you have these changes, here from UC.  I do recommend that you go to the ER  for further evaluation.  If you do not go to the ER.  Please start a fluid pill, lasix, which will make you urinate more. This should    ED Prescriptions    Medication Sig Dispense Auth. Provider   furosemide (LASIX) 20 MG tablet Take 1 tablet (20 mg total) by mouth daily for 7 days. 7 tablet Zigmund Gottron, NP     PDMP not reviewed this encounter.   Zigmund Gottron, NP 04/14/19 1652

## 2019-04-14 NOTE — ED Notes (Signed)
Walking Sa02 is 92% at lowest

## 2019-04-15 ENCOUNTER — Inpatient Hospital Stay (HOSPITAL_COMMUNITY): Payer: Medicare Other

## 2019-04-15 ENCOUNTER — Encounter (HOSPITAL_COMMUNITY): Payer: Self-pay | Admitting: Family Medicine

## 2019-04-15 DIAGNOSIS — F17200 Nicotine dependence, unspecified, uncomplicated: Secondary | ICD-10-CM | POA: Diagnosis not present

## 2019-04-15 DIAGNOSIS — E042 Nontoxic multinodular goiter: Secondary | ICD-10-CM

## 2019-04-15 DIAGNOSIS — I2510 Atherosclerotic heart disease of native coronary artery without angina pectoris: Secondary | ICD-10-CM | POA: Diagnosis not present

## 2019-04-15 DIAGNOSIS — Z809 Family history of malignant neoplasm, unspecified: Secondary | ICD-10-CM | POA: Diagnosis not present

## 2019-04-15 DIAGNOSIS — Z85828 Personal history of other malignant neoplasm of skin: Secondary | ICD-10-CM | POA: Diagnosis not present

## 2019-04-15 DIAGNOSIS — R0602 Shortness of breath: Secondary | ICD-10-CM | POA: Diagnosis not present

## 2019-04-15 DIAGNOSIS — Z79899 Other long term (current) drug therapy: Secondary | ICD-10-CM | POA: Diagnosis not present

## 2019-04-15 DIAGNOSIS — I472 Ventricular tachycardia: Secondary | ICD-10-CM | POA: Diagnosis not present

## 2019-04-15 DIAGNOSIS — Z8261 Family history of arthritis: Secondary | ICD-10-CM | POA: Diagnosis not present

## 2019-04-15 DIAGNOSIS — E669 Obesity, unspecified: Secondary | ICD-10-CM | POA: Diagnosis not present

## 2019-04-15 DIAGNOSIS — F419 Anxiety disorder, unspecified: Secondary | ICD-10-CM | POA: Diagnosis not present

## 2019-04-15 DIAGNOSIS — E1165 Type 2 diabetes mellitus with hyperglycemia: Secondary | ICD-10-CM | POA: Diagnosis not present

## 2019-04-15 DIAGNOSIS — I25119 Atherosclerotic heart disease of native coronary artery with unspecified angina pectoris: Secondary | ICD-10-CM | POA: Diagnosis not present

## 2019-04-15 DIAGNOSIS — J441 Chronic obstructive pulmonary disease with (acute) exacerbation: Secondary | ICD-10-CM

## 2019-04-15 DIAGNOSIS — F411 Generalized anxiety disorder: Secondary | ICD-10-CM

## 2019-04-15 DIAGNOSIS — Z20828 Contact with and (suspected) exposure to other viral communicable diseases: Secondary | ICD-10-CM | POA: Diagnosis present

## 2019-04-15 DIAGNOSIS — R000 Tachycardia, unspecified: Secondary | ICD-10-CM | POA: Diagnosis not present

## 2019-04-15 DIAGNOSIS — I10 Essential (primary) hypertension: Secondary | ICD-10-CM | POA: Diagnosis not present

## 2019-04-15 DIAGNOSIS — J9600 Acute respiratory failure, unspecified whether with hypoxia or hypercapnia: Secondary | ICD-10-CM | POA: Diagnosis present

## 2019-04-15 DIAGNOSIS — E7800 Pure hypercholesterolemia, unspecified: Secondary | ICD-10-CM | POA: Diagnosis not present

## 2019-04-15 DIAGNOSIS — N189 Chronic kidney disease, unspecified: Secondary | ICD-10-CM | POA: Diagnosis present

## 2019-04-15 DIAGNOSIS — I5021 Acute systolic (congestive) heart failure: Secondary | ICD-10-CM | POA: Diagnosis present

## 2019-04-15 DIAGNOSIS — I110 Hypertensive heart disease with heart failure: Secondary | ICD-10-CM | POA: Diagnosis present

## 2019-04-15 DIAGNOSIS — Z8551 Personal history of malignant neoplasm of bladder: Secondary | ICD-10-CM | POA: Diagnosis not present

## 2019-04-15 DIAGNOSIS — J810 Acute pulmonary edema: Secondary | ICD-10-CM | POA: Diagnosis present

## 2019-04-15 DIAGNOSIS — Z7984 Long term (current) use of oral hypoglycemic drugs: Secondary | ICD-10-CM | POA: Diagnosis not present

## 2019-04-15 DIAGNOSIS — J9601 Acute respiratory failure with hypoxia: Secondary | ICD-10-CM | POA: Diagnosis present

## 2019-04-15 DIAGNOSIS — I272 Pulmonary hypertension, unspecified: Secondary | ICD-10-CM | POA: Diagnosis not present

## 2019-04-15 DIAGNOSIS — Z820 Family history of epilepsy and other diseases of the nervous system: Secondary | ICD-10-CM | POA: Diagnosis not present

## 2019-04-15 DIAGNOSIS — Z823 Family history of stroke: Secondary | ICD-10-CM | POA: Diagnosis not present

## 2019-04-15 DIAGNOSIS — Z888 Allergy status to other drugs, medicaments and biological substances status: Secondary | ICD-10-CM | POA: Diagnosis not present

## 2019-04-15 DIAGNOSIS — G4733 Obstructive sleep apnea (adult) (pediatric): Secondary | ICD-10-CM | POA: Diagnosis not present

## 2019-04-15 DIAGNOSIS — F17210 Nicotine dependence, cigarettes, uncomplicated: Secondary | ICD-10-CM | POA: Diagnosis present

## 2019-04-15 DIAGNOSIS — Z833 Family history of diabetes mellitus: Secondary | ICD-10-CM | POA: Diagnosis not present

## 2019-04-15 DIAGNOSIS — E0590 Thyrotoxicosis, unspecified without thyrotoxic crisis or storm: Secondary | ICD-10-CM

## 2019-04-15 DIAGNOSIS — K219 Gastro-esophageal reflux disease without esophagitis: Secondary | ICD-10-CM | POA: Diagnosis present

## 2019-04-15 DIAGNOSIS — D72825 Bandemia: Secondary | ICD-10-CM | POA: Diagnosis not present

## 2019-04-15 DIAGNOSIS — I2583 Coronary atherosclerosis due to lipid rich plaque: Secondary | ICD-10-CM | POA: Diagnosis not present

## 2019-04-15 DIAGNOSIS — M1990 Unspecified osteoarthritis, unspecified site: Secondary | ICD-10-CM | POA: Diagnosis present

## 2019-04-15 DIAGNOSIS — Z8249 Family history of ischemic heart disease and other diseases of the circulatory system: Secondary | ICD-10-CM | POA: Diagnosis not present

## 2019-04-15 LAB — BASIC METABOLIC PANEL
Anion gap: 12 (ref 5–15)
BUN: 9 mg/dL (ref 8–23)
CO2: 26 mmol/L (ref 22–32)
Calcium: 9.4 mg/dL (ref 8.9–10.3)
Chloride: 100 mmol/L (ref 98–111)
Creatinine, Ser: 0.84 mg/dL (ref 0.61–1.24)
GFR calc Af Amer: >60 mL/min (ref >60)
GFR calc non Af Amer: >60 mL/min (ref >60)
Glucose, Bld: 200 mg/dL — ABNORMAL HIGH (ref 70–99)
Potassium: 3.8 mmol/L (ref 3.5–5.1)
Sodium: 138 mmol/L (ref 135–145)

## 2019-04-15 LAB — CBG MONITORING, ED
Glucose-Capillary: 191 mg/dL — ABNORMAL HIGH (ref 70–99)
Glucose-Capillary: 171 mg/dL — ABNORMAL HIGH (ref 70–99)

## 2019-04-15 LAB — GLUCOSE, CAPILLARY
Glucose-Capillary: 250 mg/dL — ABNORMAL HIGH (ref 70–99)
Glucose-Capillary: 263 mg/dL — ABNORMAL HIGH (ref 70–99)
Glucose-Capillary: 182 mg/dL — ABNORMAL HIGH (ref 70–99)

## 2019-04-15 LAB — ECHOCARDIOGRAM COMPLETE
Height: 72 in
Weight: 3782.4 oz

## 2019-04-15 LAB — T4, FREE: Free T4: 1.23 ng/dL — ABNORMAL HIGH (ref 0.61–1.12)

## 2019-04-15 LAB — MAGNESIUM: Magnesium: 2.2 mg/dL (ref 1.7–2.4)

## 2019-04-15 LAB — HIV ANTIBODY (ROUTINE TESTING W REFLEX): HIV Screen 4th Generation wRfx: NONREACTIVE

## 2019-04-15 LAB — HEMOGLOBIN A1C
Hgb A1c MFr Bld: 7.4 % — ABNORMAL HIGH (ref 4.8–5.6)
Mean Plasma Glucose: 165.68 mg/dL

## 2019-04-15 LAB — TSH: TSH: 0.303 u[IU]/mL — ABNORMAL LOW (ref 0.350–4.500)

## 2019-04-15 LAB — SARS CORONAVIRUS 2 (TAT 6-24 HRS): SARS Coronavirus 2: NEGATIVE

## 2019-04-15 LAB — C-REACTIVE PROTEIN: CRP: 1 mg/dL — ABNORMAL HIGH (ref <1.0)

## 2019-04-15 MED ORDER — DIAZEPAM 2 MG PO TABS
2.0000 mg | ORAL_TABLET | Freq: Three times a day (TID) | ORAL | Status: DC | PRN
  Administered 2019-04-15 – 2019-04-16 (×2): 2 mg via ORAL
  Filled 2019-04-15 (×2): qty 1

## 2019-04-15 MED ORDER — ARFORMOTEROL TARTRATE 15 MCG/2ML IN NEBU
15.0000 ug | INHALATION_SOLUTION | Freq: Two times a day (BID) | RESPIRATORY_TRACT | Status: DC
  Administered 2019-04-15 – 2019-04-18 (×6): 15 ug via RESPIRATORY_TRACT
  Filled 2019-04-15 (×6): qty 2

## 2019-04-15 MED ORDER — VITAMIN C 500 MG PO TABS
500.0000 mg | ORAL_TABLET | Freq: Every day | ORAL | Status: DC
  Administered 2019-04-15: 500 mg via ORAL
  Filled 2019-04-15: qty 1

## 2019-04-15 MED ORDER — ONDANSETRON HCL 4 MG PO TABS: 4.0000 mg | ORAL_TABLET | Freq: Four times a day (QID) | ORAL | Status: DC | PRN

## 2019-04-15 MED ORDER — INSULIN ASPART 100 UNIT/ML ~~LOC~~ SOLN
5.0000 [IU] | Freq: Three times a day (TID) | SUBCUTANEOUS | Status: DC
  Administered 2019-04-15 – 2019-04-18 (×6): 5 [IU] via SUBCUTANEOUS

## 2019-04-15 MED ORDER — IPRATROPIUM-ALBUTEROL 0.5-2.5 (3) MG/3ML IN SOLN
3.0000 mL | RESPIRATORY_TRACT | Status: DC | PRN
  Administered 2019-04-16: 3 mL via RESPIRATORY_TRACT

## 2019-04-15 MED ORDER — INSULIN ASPART 100 UNIT/ML ~~LOC~~ SOLN
0.0000 [IU] | Freq: Three times a day (TID) | SUBCUTANEOUS | Status: DC
  Administered 2019-04-15: 8 [IU] via SUBCUTANEOUS
  Administered 2019-04-15: 3 [IU] via SUBCUTANEOUS
  Administered 2019-04-15: 5 [IU] via SUBCUTANEOUS
  Administered 2019-04-16 (×2): 3 [IU] via SUBCUTANEOUS
  Administered 2019-04-16: 5 [IU] via SUBCUTANEOUS
  Administered 2019-04-17 (×2): 8 [IU] via SUBCUTANEOUS
  Administered 2019-04-17: 15 [IU] via SUBCUTANEOUS
  Administered 2019-04-18: 2 [IU] via SUBCUTANEOUS

## 2019-04-15 MED ORDER — BENZONATATE 100 MG PO CAPS
100.0000 mg | ORAL_CAPSULE | Freq: Three times a day (TID) | ORAL | Status: DC
  Administered 2019-04-15 – 2019-04-18 (×11): 100 mg via ORAL
  Filled 2019-04-15 (×11): qty 1

## 2019-04-15 MED ORDER — ALBUTEROL SULFATE HFA 108 (90 BASE) MCG/ACT IN AERS
4.0000 | INHALATION_SPRAY | Freq: Four times a day (QID) | RESPIRATORY_TRACT | Status: DC | PRN
  Filled 2019-04-15: qty 6.7

## 2019-04-15 MED ORDER — BUDESONIDE 0.25 MG/2ML IN SUSP
0.2500 mg | Freq: Two times a day (BID) | RESPIRATORY_TRACT | Status: DC
  Administered 2019-04-15 – 2019-04-18 (×6): 0.25 mg via RESPIRATORY_TRACT
  Filled 2019-04-15 (×6): qty 2

## 2019-04-15 MED ORDER — ENOXAPARIN SODIUM 40 MG/0.4ML ~~LOC~~ SOLN
40.0000 mg | SUBCUTANEOUS | Status: DC
  Administered 2019-04-15 – 2019-04-16 (×2): 40 mg via SUBCUTANEOUS
  Filled 2019-04-15 (×2): qty 0.4

## 2019-04-15 MED ORDER — NICOTINE 21 MG/24HR TD PT24
21.0000 mg | MEDICATED_PATCH | Freq: Every day | TRANSDERMAL | Status: DC
  Administered 2019-04-15 – 2019-04-18 (×4): 21 mg via TRANSDERMAL
  Filled 2019-04-15 (×5): qty 1

## 2019-04-15 MED ORDER — POTASSIUM CHLORIDE CRYS ER 20 MEQ PO TBCR
40.0000 meq | EXTENDED_RELEASE_TABLET | Freq: Once | ORAL | Status: AC
  Administered 2019-04-15: 40 meq via ORAL
  Filled 2019-04-15: qty 2

## 2019-04-15 MED ORDER — AMLODIPINE BESY-BENAZEPRIL HCL 5-20 MG PO CAPS: 1.0000 | ORAL_CAPSULE | Freq: Every morning | ORAL | Status: DC

## 2019-04-15 MED ORDER — BACLOFEN 5 MG HALF TABLET
5.0000 mg | ORAL_TABLET | Freq: Two times a day (BID) | ORAL | Status: DC
  Administered 2019-04-15 – 2019-04-18 (×7): 5 mg via ORAL
  Filled 2019-04-15 (×7): qty 1

## 2019-04-15 MED ORDER — INSULIN GLARGINE 100 UNIT/ML ~~LOC~~ SOLN
5.0000 [IU] | Freq: Two times a day (BID) | SUBCUTANEOUS | Status: DC
  Administered 2019-04-15 – 2019-04-16 (×3): 5 [IU] via SUBCUTANEOUS
  Filled 2019-04-15 (×5): qty 0.05

## 2019-04-15 MED ORDER — ZINC SULFATE 220 (50 ZN) MG PO CAPS
220.0000 mg | ORAL_CAPSULE | Freq: Every day | ORAL | Status: DC
  Administered 2019-04-15: 220 mg via ORAL
  Filled 2019-04-15 (×2): qty 1

## 2019-04-15 MED ORDER — GUAIFENESIN ER 600 MG PO TB12
600.0000 mg | ORAL_TABLET | Freq: Two times a day (BID) | ORAL | Status: DC
  Administered 2019-04-15 – 2019-04-18 (×7): 600 mg via ORAL
  Filled 2019-04-15 (×7): qty 1

## 2019-04-15 MED ORDER — DOXYCYCLINE HYCLATE 100 MG PO TABS
100.0000 mg | ORAL_TABLET | Freq: Two times a day (BID) | ORAL | Status: DC
  Administered 2019-04-15 – 2019-04-18 (×7): 100 mg via ORAL
  Filled 2019-04-15 (×7): qty 1

## 2019-04-15 MED ORDER — METOPROLOL TARTRATE 25 MG PO TABS
25.0000 mg | ORAL_TABLET | Freq: Two times a day (BID) | ORAL | Status: DC
  Administered 2019-04-15 – 2019-04-16 (×3): 25 mg via ORAL
  Filled 2019-04-15 (×3): qty 1

## 2019-04-15 MED ORDER — SODIUM CHLORIDE 0.9% FLUSH
3.0000 mL | Freq: Two times a day (BID) | INTRAVENOUS | Status: DC
  Administered 2019-04-15 – 2019-04-16 (×4): 3 mL via INTRAVENOUS

## 2019-04-15 MED ORDER — INSULIN ASPART 100 UNIT/ML ~~LOC~~ SOLN
0.0000 [IU] | Freq: Every day | SUBCUTANEOUS | Status: DC
  Administered 2019-04-16: 5 [IU] via SUBCUTANEOUS

## 2019-04-15 MED ORDER — AMLODIPINE BESYLATE 5 MG PO TABS
5.0000 mg | ORAL_TABLET | Freq: Every day | ORAL | Status: DC
  Administered 2019-04-15 – 2019-04-17 (×3): 5 mg via ORAL
  Filled 2019-04-15 (×3): qty 1

## 2019-04-15 MED ORDER — FUROSEMIDE 10 MG/ML IJ SOLN
20.0000 mg | Freq: Two times a day (BID) | INTRAMUSCULAR | Status: DC
  Administered 2019-04-15 – 2019-04-16 (×3): 20 mg via INTRAVENOUS
  Filled 2019-04-15 (×3): qty 2

## 2019-04-15 MED ORDER — ACETAMINOPHEN 325 MG PO TABS: 650.0000 mg | ORAL_TABLET | Freq: Four times a day (QID) | ORAL | Status: DC | PRN

## 2019-04-15 MED ORDER — ONDANSETRON HCL 4 MG/2ML IJ SOLN: 4.0000 mg | Freq: Four times a day (QID) | INTRAMUSCULAR | Status: DC | PRN

## 2019-04-15 MED ORDER — METHYLPREDNISOLONE SODIUM SUCC 40 MG IJ SOLR
40.0000 mg | Freq: Three times a day (TID) | INTRAMUSCULAR | Status: DC
  Administered 2019-04-15 – 2019-04-17 (×8): 40 mg via INTRAVENOUS
  Filled 2019-04-15 (×8): qty 1

## 2019-04-15 MED ORDER — ORAL CARE MOUTH RINSE
15.0000 mL | Freq: Two times a day (BID) | OROMUCOSAL | Status: DC
  Administered 2019-04-15 – 2019-04-17 (×4): 15 mL via OROMUCOSAL

## 2019-04-15 MED ORDER — BENAZEPRIL HCL 20 MG PO TABS
20.0000 mg | ORAL_TABLET | Freq: Every day | ORAL | Status: DC
  Filled 2019-04-15 (×2): qty 1

## 2019-04-15 MED ORDER — ATORVASTATIN CALCIUM 10 MG PO TABS
20.0000 mg | ORAL_TABLET | Freq: Every day | ORAL | Status: DC
  Administered 2019-04-15: 20 mg via ORAL
  Filled 2019-04-15: qty 2

## 2019-04-15 NOTE — Progress Notes (Signed)
PROGRESS NOTE  Timothy Lee [id number] DOB: 07/05/1950   PCP: Lauree Chandler, NP  Patient is from: Home  DOA: 04/14/2019 LOS: 0  Brief Narrative / Interim history: 68 year old male with history of DM-2, HTN, untreated OSA, 55-pack-year history and current smoker presenting with worsening shortness of breath and productive cough.  Recently returned from [facility] 2 weeks ago.  Treated outpatient with Z-Pak, Tessalon Perles and home albuterol without significant improvement in his breathing.  Called PCP who advised him to go to urgent care.  At UC, had rales, strong cough, tachycardia, DOE, desaturation to 89% on RA and CXR concerning for CHF.  He was referred to ED.   In ED, tachycardic, tachypneic and hypertensive.  Saturating at 95% on room air.  BMP unremarkable except for mildly elevated glucose.  BNP 288.  CBC unremarkable.  COVID-19 negative.  CTA chest negative for PE but concerning for CHF, mediastinal lymphadenopathy and heterogeneous left thyroid lobe nodule.  EKG with sinus tachycardia, LAE and nonspecific T wave changes.  Started on IV Lasix, IV Solu-Medrol and breathing treatments and admitted for acute hypoxic respiratory failure due to new CHF and COPD exacerbation.  Patient has no formal diagnosis of COPD but has been on as needed albuterol.  Subjective: No major events overnight of this morning.  Reports improvement in his breathing but not quite close to baseline.  Reports productive cough with whitish to grayish phlegm but denies hemoptysis.  Denies chest pain or palpitation.  Denies GI or UTI symptoms.  States that he has been making a lot of urine since IV Lasix.  Objective: Vitals:   04/15/19 1127 04/15/19 1246 04/15/19 1247 04/15/19 1300  BP: (!) 151/95 (!) 141/94 (!) 148/87 (!) 130/99  Pulse: (!) 117 (!) 115 96 (!) 107  Resp: 20     Temp: 98.4 F (36.9 C)     TempSrc: Oral     SpO2: 98%     Weight: 107.2 kg     Height: 6' (1.829 m)        Intake/Output Summary (Last 24 hours) at 04/15/2019 1356 Last data filed at 04/15/2019 0915 Gross per 24 hour  Intake 3 ml  Output 950 ml  Net -947 ml   Filed Weights   04/14/19 1727 04/15/19 1127  Weight: 110.2 kg 107.2 kg    Examination:  GENERAL: No acute distress.  Appears well.  HEENT: MMM.  Vision and hearing grossly intact.  NECK: Supple.  No apparent JVD.  RESP:  No IWOB.  Diminished aeration bilaterally. CVS:  RRR. Heart sounds normal.  ABD/GI/GU: Bowel sounds present. Soft. Non tender.  MSK/EXT:  No apparent deformity or edema. Moves extremities. SKIN: no apparent skin lesion or wound NEURO: Awake, alert and oriented appropriately.  No gross deficit.  PSYCH: Calm. Normal affect.   Assessment & Plan: Acute hypoxic respiratory failure: Likely a combination of new CHF and COPD exacerbation.  Imaging and BNP favors CHF.  However, his exam concerning for COPD exacerbation.  He has significantly diminished aeration bilaterally.  PE excluded by CTA chest.  Has no fever or leukocytosis to suggest infectious process.  COVID-19 negative. -Wean oxygen as able -Treat treatable causes.  Acute CHF: Unknown type.  CXR, CTA chest and BNP suggestive for this.  However, he does not look fluid overloaded on exam.  Responding to IV Lasix. -Continue IV Lasix at 20 mg twice daily -Monitor fluid status, renal function and electrolytes -Sodium and fluid restriction. -Follow echocardiogram.  COPD exacerbation:  Has cardinal symptoms including dyspnea and productive cough.  No formal diagnosis but on albuterol.  Has 55-pack-year history and is still smoking. -Continue IV Solu-Medrol -I will add doxycycline given productive cough-recently completed antibiotic course with Z-Pak. -Add budesonide, Brovana and as needed DuoNeb-COVID-19 negative. -Incentive spirometry and mucolytic's.  Coronary atherosclerosis: Noted on CTA chest -Echocardiogram as above.  Heterogeneous left thyroid  nodules: hot nodules? He has low TSH and mildly elevated T4.  No history of thyroid disease. -Hold initiate low-dose beta-blocker -Will obtain thyroid ultrasound.  Uncontrolled NIDDM-2 with hyperglycemia: A1c 7.4%. CBG (last 3)  Recent Labs    04/15/19 0145 04/15/19 0753 04/15/19 1122  GLUCAP 191* 171* 250*  -Add Lantus 5 units twice daily -Added NovoLog 5 units AC -Continue SSI -Start statin  Tachycardia: could be due to albuterol or hyperthyroidism. -Start low-dose metoprolol  Tobacco use disorder: 55-pack-year history and still smoking but started cutting down. -Encourage cessation -Continue NicoDerm.  Anxiety: Stable -Continue home Valium  History of bladder cancer status post local excision -Outpatient follow-up  Essential hypertension: -Continue home amlodipine -Hold home ACE inhibitor-to allow room for diuretics -Add low-dose metoprolol in the setting of tachycardia and possible hyperthyroidism.               DVT prophylaxis: Subcu Lovenox Code Status: Full code Family Communication: Patient and/or RN. Available if any question. Disposition Plan: Remains inpatient for adequate diuresis and optimization of breathing treatments.  Still with dyspnea and significant cough.  Consultants: None  Procedures:  None  Microbiology summarized: COVID-19 screen negative.  Sch Meds:  Scheduled Meds:  amLODipine  5 mg Oral Daily   And   benazepril  20 mg Oral Daily   benzonatate  100 mg Oral TID   enoxaparin (LOVENOX) injection  40 mg Subcutaneous Q24H   furosemide  20 mg Intravenous Q12H   insulin aspart  0-15 Units Subcutaneous TID WC   insulin aspart  0-5 Units Subcutaneous QHS   methylPREDNISolone (SOLU-MEDROL) injection  40 mg Intravenous Q8H   nicotine  21 mg Transdermal Daily   sodium chloride flush  3 mL Intravenous Q12H   vitamin C  500 mg Oral Daily   zinc sulfate  220 mg Oral Daily   Continuous Infusions: PRN Meds:.acetaminophen,  albuterol, diazepam, ondansetron **OR** ondansetron (ZOFRAN) IV  Antimicrobials: Anti-infectives (From admission, onward)   None       I have personally reviewed the following labs and images: CBC: Recent Labs  Lab 04/14/19 1729  WBC 10.4  HGB 15.5  HCT 46.8  MCV 90.2  PLT 366   BMP &GFR Recent Labs  Lab 04/14/19 1630 04/14/19 1729 04/15/19 0432  NA 138 138 138  K 4.1 4.1 3.8  CL 102 103 100  CO2 25 25 26   GLUCOSE 124* 139* 200*  BUN 10 10 9   CREATININE 1.00 0.99 0.84  CALCIUM 9.6 9.2 9.4  MG  --   --  2.2   Estimated Creatinine Clearance: 107.9 mL/min (by C-G formula based on SCr of 0.84 mg/dL). Liver & Pancreas: No results for input(s): AST, ALT, ALKPHOS, BILITOT, PROT, ALBUMIN in the last 168 hours. No results for input(s): LIPASE, AMYLASE in the last 168 hours. No results for input(s): AMMONIA in the last 168 hours. Diabetic: Recent Labs    04/15/19 0432  HGBA1C 7.4*   Recent Labs  Lab 04/15/19 0145 04/15/19 0753 04/15/19 1122  GLUCAP 191* 171* 250*   Cardiac Enzymes: No results for input(s): CKTOTAL, CKMB, CKMBINDEX, TROPONINI  in the last 168 hours. No results for input(s): PROBNP in the last 8760 hours. Coagulation Profile: No results for input(s): INR, PROTIME in the last 168 hours. Thyroid Function Tests: Recent Labs    04/15/19 0901  TSH 0.303*  FREET4 1.23*   Lipid Profile: No results for input(s): CHOL, HDL, LDLCALC, TRIG, CHOLHDL, LDLDIRECT in the last 72 hours. Anemia Panel: No results for input(s): VITAMINB12, FOLATE, FERRITIN, TIBC, IRON, RETICCTPCT in the last 72 hours. Urine analysis: No results found for: COLORURINE, APPEARANCEUR, LABSPEC, PHURINE, GLUCOSEU, HGBUR, BILIRUBINUR, KETONESUR, PROTEINUR, UROBILINOGEN, NITRITE, LEUKOCYTESUR Sepsis Labs: Invalid input(s): PROCALCITONIN, [place]  Microbiology: Recent Results (from the past 240 hour(s))  SARS CORONAVIRUS 2 (TAT 6-24 HRS) Nasopharyngeal Nasopharyngeal Swab      Status: None   Collection Time: 04/14/19  7:15 PM   Specimen: Nasopharyngeal Swab  Result Value Ref Range Status   SARS Coronavirus 2 NEGATIVE NEGATIVE Final    Comment: (NOTE) SARS-CoV-2 target nucleic acids are NOT DETECTED. The SARS-CoV-2 RNA is generally detectable in upper and lower respiratory specimens during the acute phase of infection. Negative results do not preclude SARS-CoV-2 infection, do not rule out co-infections with other pathogens, and should not be used as the sole basis for treatment or other patient management decisions. Negative results must be combined with clinical observations, patient history, and epidemiological information. The expected result is Negative. Fact Sheet for Patients: SugarRoll.be Fact Sheet for Healthcare Providers: https://www.woods-mathews.com/ This test is not yet approved or cleared by the [place] FDA and  has been authorized for detection and/or diagnosis of SARS-CoV-2 by FDA under an Emergency Use Authorization (EUA). This EUA will remain  in effect (meaning this test can be used) for the duration of the COVID-19 declaration under Section 56 4(b)(1) of the Act, 21 U.S.C. section 360bbb-3(b)(1), unless the authorization is terminated or revoked sooner. Performed at [facility], [place] [address], [place], [place] [address]     Radiology Studies: Dg Chest 2 View  Result Date: 04/14/2019 CLINICAL DATA:  Cough and shortness of breath despite taking azithromycin. Smoker. EXAM: CHEST - 2 VIEW COMPARISON:  Radiograph 09/18/2017 FINDINGS: Mild cardiomegaly. Unchanged aortic tortuosity compared to prior exam. Interstitial thickening most consistent with pulmonary edema. Small bilateral pleural effusions. No confluent airspace disease. No pneumothorax. No acute osseous abnormalities are seen. Mild wedging of lower thoracic vertebra is unchanged. IMPRESSION: Mild cardiomegaly with small pleural  effusions. Interstitial thickening suspicious for pulmonary edema. Findings are most consistent with CHF. Electronically Signed   By: Keith Rake [name]   On: 04/14/2019 15:38   Ct Angio Chest Pe W And/or Wo Contrast  Result Date: 04/14/2019 CLINICAL DATA:  Chest pain shortness of breath for 6 days, cough EXAM: CT ANGIOGRAPHY CHEST WITH CONTRAST TECHNIQUE: Multidetector CT imaging of the chest was performed using the standard protocol during bolus administration of intravenous contrast. Multiplanar CT image reconstructions and MIPs were obtained to evaluate the vascular anatomy. CONTRAST:  116mL OMNIPAQUE IOHEXOL 350 MG/ML SOLN COMPARISON:  Chest radiograph 04/04/2019 FINDINGS: Cardiovascular: Satisfactory opacification the pulmonary arteries to the segmental level. No pulmonary artery filling defects are identified. Central pulmonary arteries are normal caliber. No elevation of the RV/LV ratio (0.73). Mild cardiomegaly. Trace pericardial fluid. Coronary artery calcifications are noted. Thoracic aortic caliber is at the upper limits of normal. Minimal atherosclerotic plaque in the aorta. The left vertebral artery arises directly from the aortic arch. Minimal atherosclerotic plaque in the great vessels. Mediastinum/Nodes: Low-attenuation mediastinal and hilar nodes are present.  Several larger low-attenuation nodes include a 12 mm subcarinal lymph node (5/73), a 11 mm left carinal lymph node (5/61) and a 12 mm right carinal lymph node (5/70). There is a heterogeneously enlarged left lobe thyroid gland. Discrete nodularity difficult to ascertain. Bowing of the trachea compatible with imaging during exhalation. Mild diffuse peribronchovascular thickening is noted. No acute abnormality of the esophagus. Lungs/Pleura: Moderate bilateral effusions with adjacent passive atelectasis in the airless lung. Additional fluid seen tracking into the fissures with adjacent passive atelectasis of the upper lobes as well.  Interlobular septal thickening is noted in the lung bases and apices with some central peribronchovascular opacity. Interspersed areas of ground-glass opacity are noted. No pneumothorax. Upper Abdomen: Mild bilateral symmetric perinephric stranding, a nonspecific finding though may correlate with either age or decreased renal function. No acute abnormalities present in the visualized portions of the upper abdomen. Musculoskeletal: T11 anterior wedging is similar to comparison from 2018. Multilevel degenerative changes are present in the imaged portions of the spine. No acute osseous abnormality or suspicious osseous lesion. No suspicious chest wall lesions. Review of the MIP images confirms the above findings. IMPRESSION: 1. No evidence of pulmonary embolism. 2. Moderate bilateral pleural effusions with adjacent passive atelectasis in the airless lung. 3. Interlobular septal thickening in the lung bases and apices with some central peribronchovascular and ground-glass opacity, most suggestive of pulmonary edema. 4. Low-attenuation mediastinal and hilar nodes, likely reactive/edematous. 5. Heterogeneous, enlarged left lobe thyroid gland. Discrete nodularity difficult to ascertain. Consider further evaluation with thyroid ultrasound. This follows consensus guidelines: Managing Incidental Thyroid Nodules Detected on Imaging: White Paper of the ACR Incidental Thyroid Findings Committee. J Am Coll Radiol 2015; 12:143-150. and [facility] 3-tiered system for managing ITNs: J Am Coll Radiol. 2015; Feb;12(2): 143-50 6. Aortic Atherosclerosis (ICD10-I70.0). 7. Cardiomegaly.  Coronary atherosclerosis. Electronically Signed   By: Lovena Le [name]   On: 04/14/2019 22:54   35 minutes with more than 50% spent in reviewing records, counseling patient/family and coordinating care.  Saralee Bolick T. [place]  If 7PM-7AM, please contact night-coverage [email or web address] Password [email or web address] 04/15/2019, 1:56 PM

## 2019-04-15 NOTE — Progress Notes (Signed)
   Vital Signs MEWS/VS Documentation      04/15/2019 1000 04/15/2019 1117 04/15/2019 1127 04/15/2019 1246   MEWS Score:  3  4  2  2    MEWS Score Color:  Yellow  Red  Yellow  Yellow   Resp:  (!) 26  -  20  -   Pulse:  (!) 108  -  (!) 117  (!) 115   BP:  (!) 147/102  -  (!) 151/95  (!) 141/94   Temp:  -  -  98.4 F (36.9 C)  -   O2 Device:  -  -  Nasal Cannula  -   O2 Flow Rate (L/min):  -  -  3 L/min  -         Pt arrived with vitals from ED.sinus Tachy.  [place] 04/15/2019,1:00 PM

## 2019-04-15 NOTE — Progress Notes (Signed)
  Echocardiogram 2D Echocardiogram has been performed.  Matilde Bash 04/15/2019, 1:38 PM

## 2019-04-15 NOTE — ED Notes (Signed)
Pt's breakfast arrived 

## 2019-04-15 NOTE — H&P (Addendum)
History and Physical   Timothy Lee [id number] DOB: 10-30-1950 DOA: 04/14/2019  Referring MD/NP/PA: Janeece Fitting, NP PCP: Timothy Chandler, NP Outpatient Specialist: Timothy Du, MD  Patient coming from: Home by way of urgent care  Chief Complaint: Shortness of breath  HPI: Timothy Lee is a 68 y.o. male with a history of HTN, T2DM, untreated OSA, longstanding tobacco use who presented to the ED from urgent care 11/25 with shortness of breath. He had flown back from a trip to [facility] 2 weeks ago and developed moderate, constant shortness of breath that developed over the course of the morning on 11/19. He had a telehealth visit with his PCP who prescribed azithromycin and tessalon which did help the cough which is productive and associated with post nasal drip, but did not help the shortness of breath. He's taken other OTC medications with little improvement, and an albuterol inhaler which improved symptoms for several hours at a time. When this improvement lessened, he sought care at urgent care. There he had rales and strong cough, tachycardia, and was hypoxic to 89% with ambulation with significant dyspnea. CXR demonstrated mild cardiomegaly with small pleural effusions. Interstitial thickening suspicious for pulmonary edema. He was referred to the ED and reluctantly agreed. CBC and metabolic panel unremarkable, BNP elevated to 288, and CTA chest performed ruling out PE but demonstrating bilateral pleural effusions and interlobular septal thickening in the lung bases and apices with some central peribronchovascular and ground-glass opacity, most suggestive of pulmonary edema.   Review of Systems: Confirms cough, wheezing. Denies PND, orthopnea, leg swelling, fever, chest pain, palpitations, abdominal pain, and per HPI. All others reviewed and are negative.   Past Medical History:  Diagnosis Date  . Arthritis   . Bladder cancer Timothy Lee) urologist-  dr Timothy Lee  . Borderline  diabetes   . Bursitis of both shoulders   . Diabetes mellitus without complication (Timothy Lee)   . Disorder of male genital organs, unspecified   . Esophagus disorder    Constricted Esophagus, per New Patient Packet-PSC   . History of basal cell carcinoma (BCC) excision    05/ 2018  right forehead  . Hypertension   . Mild acid reflux no meds  . Nocturia   . OSA (obstructive sleep apnea)    per pt has not used cpap in years  . Productive cough    from smoking  . Renal cyst, left 11/2011   16 mm  . Sleep apnea    Per New Patient Packet-PSC   . Smokers' cough ([place])   . SOB (shortness of breath) on exertion    Past Surgical History:  Procedure Laterality Date  . CYSTOSCOPY WITH BIOPSY  03/13/2012   Procedure: CYSTOSCOPY WITH BIOPSY;  Surgeon: Timothy Bonine, MD;  Location: [facility];  Service: Urology;  Laterality: N/A;     . ESOPHAGEAL DILATION  05/20/1986   Esophagus Stretch, per New Patient Packet-PSC   . TONSILLECTOMY  age 54  . TRANSURETHRAL RESECTION OF BLADDER TUMOR N/A 05/23/2017   Procedure: TRANSURETHRAL RESECTION OF BLADDER TUMOR / (TURBT) WITH INSTILLATION OF EPIRUBICIN;  Surgeon: Timothy Aloe, MD;  Location[facility];  Service: Urology;  Laterality: N/A;  . UVULECTOMY  05/20/1990   Dr.Byers, per new patient packet   . UVULOPALATOPHARYNGOPLASTY  2005   osa   - Smoking heavily. Lives with wife.  Allergies  Allergen Reactions  . Chantix [Varenicline Tartrate]     Aggression    Family History  Problem Relation Age of Onset  . Diabetes Mother   . Dementia Mother   . Stroke Mother   . Arthritis Mother   . Hypertension Mother   . Multiple sclerosis Father   . Diabetes Father   . Diabetes Sister   . Hypertension Sister   . ADD / ADHD Son   . Migraines Daughter   . ADD / ADHD Daughter   . Cancer Maternal Grandmother    - Family history otherwise reviewed and not pertinent.  Prior to Admission medications    Medication Sig Start Date End Date Taking? Authorizing Provider  acetaminophen (TYLENOL) 325 MG tablet Take 650 mg by mouth every 6 (six) hours as needed for moderate pain.     [provider]  albuterol (VENTOLIN HFA) 108 (90 Base) MCG/ACT inhaler Inhale 2 puffs into the lungs every 6 (six) hours as needed for wheezing or shortness of breath. 04/09/19   Lee, Timothy C, NP  amLODipine-benazepril (LOTREL) 5-20 MG capsule Take 1 capsule by mouth every morning. 01/12/19   Timothy Chandler, NP  azithromycin (ZITHROMAX) 250 MG tablet Take 500 mg ( 2 tablet) by mouth x 1 dose then 250 mg tablet daily for 4 day 04/09/19   Lee, Timothy C, NP  Baclofen 5 MG TABS Take 10 mg by mouth 3 (three) times daily as needed (pain).     [provider]  furosemide (LASIX) 20 MG tablet Take 1 tablet (20 mg total) by mouth daily for 7 days. 04/14/19 04/21/19  Timothy Gamble B, NP  glucose blood test strip 1 each by Other route daily. Dx: E11.65 12/21/18   Timothy Chandler, NP  ibuprofen (ADVIL,MOTRIN) 200 MG tablet Take 200 mg by mouth every 6 (six) hours as needed for mild pain.     [provider]  metFORMIN (GLUCOPHAGE) 1000 MG tablet Take 1 tablet (1,000 mg total) by mouth 2 (two) times daily with a meal. 09/28/18   Timothy Chandler, NP    Physical Exam: Vitals:   04/14/19 2200 04/14/19 2207 04/14/19 2215 04/15/19 0000  BP: 130/84   (!) 118/98  Pulse:  (!) 111 (!) 109   Resp: (!) 24 (!) 21 18 19   Temp:      TempSrc:      SpO2:  94% 95%   Weight:      Height:       Constitutional: 68 y.o. male in no distress, pleasant demeanor Eyes: Lids and conjunctivae normal, PERRL ENMT: Mucous membranes are moist. Posterior pharynx clear of any exudate or lesions. Fair dentition.  Neck: Normal, supple, no masses, no thyromegaly Respiratory: Non-labored breathing, tachypneic at rest without accessory muscle use. Bilateral crackles, diminished at bases with very minimal end-expiratory  wheezes scattered bilaterally Cardiovascular: Regular tachycardia, no murmurs, rubs, or gallops. No carotid bruits. No JVD. Trace LE edema. Palpable pedal pulses. Abdomen: Normoactive bowel sounds. No tenderness, non-distended, and no masses palpated. No hepatosplenomegaly. GU: No indwelling catheter Musculoskeletal: No clubbing / cyanosis. No joint deformity upper and lower extremities. Good ROM, no contractures. Normal muscle tone.  Skin: Warm, dry. No rashes, wounds, or ulcers on visualized skin. Neurologic: CN II-XII grossly intact. Speech normal. No focal deficits in motor strength or sensation in all extremities.  Psychiatric: Alert and oriented x3. Normal judgment and insight. Mood euthymic with broad affect.   Labs on Admission: I have personally reviewed following labs and imaging studies  CBC: Recent Labs  Lab 04/14/19 1729  WBC 10.4  HGB 15.5  HCT 46.8  MCV 90.2  PLT [id number]   Basic Metabolic Panel: Recent Labs  Lab 04/14/19 1630 04/14/19 1729  NA 138 138  K 4.1 4.1  CL 102 103  CO2 25 25  GLUCOSE 124* 139*  BUN 10 10  CREATININE 1.00 0.99  CALCIUM 9.6 9.2   GFR: Estimated Creatinine Clearance: 92.8 mL/min (by Lee-G formula based on SCr of 0.99 mg/dL).  Recent Results (from the past 240 hour(s))  SARS CORONAVIRUS 2 (TAT 6-24 HRS) Nasopharyngeal Nasopharyngeal Swab     Status: None   Collection Time: 04/14/19  7:15 PM   Specimen: Nasopharyngeal Swab  Result Value Ref Range Status   SARS Coronavirus 2 NEGATIVE NEGATIVE Final    Comment: (NOTE) SARS-CoV-2 target nucleic acids are NOT DETECTED. The SARS-CoV-2 RNA is generally detectable in upper and lower respiratory specimens during the acute phase of infection. Negative results do not preclude SARS-CoV-2 infection, do not rule out co-infections with other pathogens, and should not be used as the sole basis for treatment or other patient management decisions. Negative results must be combined with clinical  observations, patient history, and epidemiological information. The expected result is Negative. Fact Sheet for Patients: SugarRoll.be Fact Sheet for Healthcare Providers: https://www.woods-mathews.com/ This test is not yet approved or cleared by the [place] FDA and  has been authorized for detection and/or diagnosis of SARS-CoV-2 by FDA under an Emergency Use Authorization (EUA). This EUA will remain  in effect (meaning this test can be used) for the duration of the COVID-19 declaration under Section 56 4(Lee)(1) of the Act, 21 [place]. section 360bbb-3(Lee)(1), unless the authorization is terminated or revoked sooner. Performed at [facility], [place] [address], [place], [place] [address]      Radiological Exams on Admission: Dg Chest 2 View  Result Date: 04/14/2019 CLINICAL DATA:  Cough and shortness of breath despite taking azithromycin. Smoker. EXAM: CHEST - 2 VIEW COMPARISON:  Radiograph 09/18/2017 FINDINGS: Mild cardiomegaly. Unchanged aortic tortuosity compared to prior exam. Interstitial thickening most consistent with pulmonary edema. Small bilateral pleural effusions. No confluent airspace disease. No pneumothorax. No acute osseous abnormalities are seen. Mild wedging of lower thoracic vertebra is unchanged. IMPRESSION: Mild cardiomegaly with small pleural effusions. Interstitial thickening suspicious for pulmonary edema. Findings are most consistent with CHF. Electronically Signed   By: Keith Rake [name]   On: 04/14/2019 15:38   Ct Angio Chest Pe W And/or Wo Contrast  Result Date: 04/14/2019 CLINICAL DATA:  Chest pain shortness of breath for 6 days, cough EXAM: CT ANGIOGRAPHY CHEST WITH CONTRAST TECHNIQUE: Multidetector CT imaging of the chest was performed using the standard protocol during bolus administration of intravenous contrast. Multiplanar CT image reconstructions and MIPs were obtained to evaluate the vascular anatomy.  CONTRAST:  133mL OMNIPAQUE IOHEXOL 350 MG/ML SOLN COMPARISON:  Chest radiograph 04/04/2019 FINDINGS: Cardiovascular: Satisfactory opacification the pulmonary arteries to the segmental level. No pulmonary artery filling defects are identified. Central pulmonary arteries are normal caliber. No elevation of the RV/LV ratio (0.73). Mild cardiomegaly. Trace pericardial fluid. Coronary artery calcifications are noted. Thoracic aortic caliber is at the upper limits of normal. Minimal atherosclerotic plaque in the aorta. The left vertebral artery arises directly from the aortic arch. Minimal atherosclerotic plaque in the great vessels. Mediastinum/Nodes: Low-attenuation mediastinal and hilar nodes are present. Several larger low-attenuation nodes include a 12 mm subcarinal lymph node (5/73), a 11 mm left carinal lymph node (5/61) and a 12 mm right carinal lymph node (5/70). There is a  heterogeneously enlarged left lobe thyroid gland. Discrete nodularity difficult to ascertain. Bowing of the trachea compatible with imaging during exhalation. Mild diffuse peribronchovascular thickening is noted. No acute abnormality of the esophagus. Lungs/Pleura: Moderate bilateral effusions with adjacent passive atelectasis in the airless lung. Additional fluid seen tracking into the fissures with adjacent passive atelectasis of the upper lobes as well. Interlobular septal thickening is noted in the lung bases and apices with some central peribronchovascular opacity. Interspersed areas of ground-glass opacity are noted. No pneumothorax. Upper Abdomen: Mild bilateral symmetric perinephric stranding, a nonspecific finding though may correlate with either age or decreased renal function. No acute abnormalities present in the visualized portions of the upper abdomen. Musculoskeletal: T11 anterior wedging is similar to comparison from 2018. Multilevel degenerative changes are present in the imaged portions of the spine. No acute osseous  abnormality or suspicious osseous lesion. No suspicious chest wall lesions. Review of the MIP images confirms the above findings. IMPRESSION: 1. No evidence of pulmonary embolism. 2. Moderate bilateral pleural effusions with adjacent passive atelectasis in the airless lung. 3. Interlobular septal thickening in the lung bases and apices with some central peribronchovascular and ground-glass opacity, most suggestive of pulmonary edema. 4. Low-attenuation mediastinal and hilar nodes, likely reactive/edematous. 5. Heterogeneous, enlarged left lobe thyroid gland. Discrete nodularity difficult to ascertain. Consider further evaluation with thyroid ultrasound. This follows consensus guidelines: Managing Incidental Thyroid Nodules Detected on Imaging: White Paper of the ACR Incidental Thyroid Findings Committee. J Am Coll Radiol 2015; 12:143-150. and [facility] 3-tiered system for managing ITNs: J Am Coll Radiol. 2015; Feb;12(2): 143-50 6. Aortic Atherosclerosis (ICD10-I70.0). 7. Cardiomegaly.  Coronary atherosclerosis. Electronically Signed   By: Lovena Le [name]   On: 04/14/2019 22:54    EKG: Independently reviewed. Sinus tachycardia with normal axis, P wave suggestive of RAE, early repol in precordial leads. Note S1Q3T3.   Assessment/Plan Principal Problem:   Acute respiratory failure with hypoxia (HCC) Active Problems:   Essential hypertension   Smoker   Type 2 diabetes mellitus with hyperglycemia, without long-term current use of insulin (HCC)   Acute hypoxic respiratory failure: Has had documented hypoxia <90% today, has wheezes and crackles on exam with pulmonary findings on imaging. Suspected to be multifactorial including acute on chronic bronchitis, pulmonary edema, possible viral infection, and untreated OSA.  - R/o covid, admit as PUI, empiric precautions as SARS-CoV-2 PCR remains pending. Pt was in [facility] [place] a week before symptoms began. Do not believe bacterial/atypical pneumonia is likely as he  failed outpatient azithromycin. - BNP elevated, CT lung findings are nonspecific but seem to favor interstitial edema over typical peripheral/basilar-dominant GGOs. Empiric lasix given. Check echocardiogram. Follow daily weights, I/O.  - Give steroids and albuterol inhaler (Can switch to neb if/when covid ruled out). - Will need dedicated pulmonary follow up due to significant smoking history and lung disease.   Coronary atherosclerosis:  - Would benefit from risk stratification and cardiology follow up  Heterogeneous, enlarged left lobe thyroid gland: Discrete nodularity difficult to ascertain.  - Consider further evaluation with U/S. - Will need to discuss this with patient.  NIDT2DM:  - Given SSI with anticipation of steroid-induced hyperglycemia.  - Hold metformin  Tobacco use:  - Augment to 21mg  nicotine patch  Anxiety:  - Valium prn  History of bladder CA s/p local excision:  - Monitor UOP.   HTN:  - Continue home medications  DVT prophylaxis: Lovenox  Code Status: Full  Family Communication: Wife at bedside Disposition Plan: Home when improved  Consults called: None  Admission status: Observation   Date of Service: 04/14/2019  Patrecia Pour, MD [facility] [email or web address] 04/15/2019, 1:25 AM

## 2019-04-15 NOTE — ED Notes (Signed)
Pt's CBG result was 171. Informed Anna - RN.

## 2019-04-15 NOTE — Progress Notes (Signed)
Echocardiogram with LVEF of 35 to 40%,  regional wall motion abnormalities, global hypokinesis and moderately reduced RVEF.  Cardiology consulted and will see patient in the morning.  Suggested keeping patient n.p.o. in case he needs heart catheterization.  Explained this to patient over the phone. He has expressed appreciation for letting him know.

## 2019-04-15 NOTE — Progress Notes (Signed)
  Echocardiogram 2D Echocardiogram has been performed.  Matilde Bash 04/15/2019, 1:39 PM

## 2019-04-16 ENCOUNTER — Encounter (HOSPITAL_COMMUNITY)
Admission: EM | Disposition: A | Discharge status: Home / Self Care | Payer: Self-pay | Source: Home / Self Care | Attending: Student

## 2019-04-16 ENCOUNTER — Inpatient Hospital Stay (HOSPITAL_COMMUNITY): Payer: Medicare Other

## 2019-04-16 DIAGNOSIS — R0602 Shortness of breath: Secondary | ICD-10-CM

## 2019-04-16 DIAGNOSIS — I2510 Atherosclerotic heart disease of native coronary artery without angina pectoris: Secondary | ICD-10-CM

## 2019-04-16 DIAGNOSIS — J9601 Acute respiratory failure with hypoxia: Secondary | ICD-10-CM

## 2019-04-16 DIAGNOSIS — D72825 Bandemia: Secondary | ICD-10-CM

## 2019-04-16 HISTORY — PX: RIGHT/LEFT HEART CATH AND CORONARY ANGIOGRAPHY: CATH118266

## 2019-04-16 LAB — POCT I-STAT EG7
Acid-Base Excess: 4 mmol/L — ABNORMAL HIGH (ref 0.0–2.0)
Acid-Base Excess: 5 mmol/L — ABNORMAL HIGH (ref 0.0–2.0)
Bicarbonate: 30.1 mmol/L — ABNORMAL HIGH (ref 20.0–28.0)
Bicarbonate: 31.3 mmol/L — ABNORMAL HIGH (ref 20.0–28.0)
Calcium, Ion: 1.26 mmol/L (ref 1.15–1.40)
Calcium, Ion: 1.27 mmol/L (ref 1.15–1.40)
HCT: 44 % (ref 39.0–52.0)
HCT: 44 % (ref 39.0–52.0)
Hemoglobin: 15 g/dL (ref 13.0–17.0)
Hemoglobin: 15 g/dL (ref 13.0–17.0)
O2 Saturation: 69 %
O2 Saturation: 69 %
Potassium: 4 mmol/L (ref 3.5–5.1)
Potassium: 4 mmol/L (ref 3.5–5.1)
Sodium: 138 mmol/L (ref 135–145)
Sodium: 138 mmol/L (ref 135–145)
TCO2: 32 mmol/L (ref 22–32)
TCO2: 33 mmol/L — ABNORMAL HIGH (ref 22–32)
pCO2, Ven: 51.3 mmHg (ref 44.0–60.0)
pCO2, Ven: 52.8 mmHg (ref 44.0–60.0)
pH, Ven: 7.376 (ref 7.250–7.430)
pH, Ven: 7.381 (ref 7.250–7.430)
pO2, Ven: 38 mmHg (ref 32.0–45.0)
pO2, Ven: 38 mmHg (ref 32.0–45.0)

## 2019-04-16 LAB — POCT I-STAT 7, (LYTES, BLD GAS, ICA,H+H)
Acid-base deficit: 2 mmol/L (ref 0.0–2.0)
Bicarbonate: 25.4 mmol/L (ref 20.0–28.0)
Calcium, Ion: 1.21 mmol/L (ref 1.15–1.40)
HCT: 43 % (ref 39.0–52.0)
Hemoglobin: 14.6 g/dL (ref 13.0–17.0)
O2 Saturation: 92 %
Potassium: 3.6 mmol/L (ref 3.5–5.1)
Sodium: 123 mmol/L — ABNORMAL LOW (ref 135–145)
TCO2: 27 mmol/L (ref 22–32)
pCO2 arterial: 51.4 mmHg — ABNORMAL HIGH (ref 32.0–48.0)
pH, Arterial: 7.302 — ABNORMAL LOW (ref 7.350–7.450)
pO2, Arterial: 72 mmHg — ABNORMAL LOW (ref 83.0–108.0)

## 2019-04-16 LAB — BASIC METABOLIC PANEL
Anion gap: 11 (ref 5–15)
BUN: 12 mg/dL (ref 8–23)
CO2: 28 mmol/L (ref 22–32)
Calcium: 9.6 mg/dL (ref 8.9–10.3)
Chloride: 99 mmol/L (ref 98–111)
Creatinine, Ser: 0.84 mg/dL (ref 0.61–1.24)
GFR calc Af Amer: >60 mL/min (ref >60)
GFR calc non Af Amer: >60 mL/min (ref >60)
Glucose, Bld: 207 mg/dL — ABNORMAL HIGH (ref 70–99)
Potassium: 4.3 mmol/L (ref 3.5–5.1)
Sodium: 138 mmol/L (ref 135–145)

## 2019-04-16 LAB — GLUCOSE, CAPILLARY
Glucose-Capillary: 185 mg/dL — ABNORMAL HIGH (ref 70–99)
Glucose-Capillary: 193 mg/dL — ABNORMAL HIGH (ref 70–99)
Glucose-Capillary: 247 mg/dL — ABNORMAL HIGH (ref 70–99)
Glucose-Capillary: 380 mg/dL — ABNORMAL HIGH (ref 70–99)

## 2019-04-16 LAB — CBC
HCT: 43.9 % (ref 39.0–52.0)
Hemoglobin: 14.9 g/dL (ref 13.0–17.0)
MCH: 29.9 pg (ref 26.0–34.0)
MCHC: 33.9 g/dL (ref 30.0–36.0)
MCV: 88.2 fL (ref 80.0–100.0)
Platelets: 345 10*3/uL (ref 150–400)
RBC: 4.98 MIL/uL (ref 4.22–5.81)
RDW: 12 % (ref 11.5–15.5)
WBC: 18.1 10*3/uL — ABNORMAL HIGH (ref 4.0–10.5)
nRBC: 0 % (ref 0.0–0.2)

## 2019-04-16 LAB — LIPID PANEL
Cholesterol: 156 mg/dL (ref 0–200)
HDL: 45 mg/dL (ref >40)
LDL Cholesterol: 101 mg/dL — ABNORMAL HIGH (ref 0–99)
Total CHOL/HDL Ratio: 3.5 RATIO
Triglycerides: 48 mg/dL (ref <150)
VLDL: 10 mg/dL (ref 0–40)

## 2019-04-16 LAB — MAGNESIUM: Magnesium: 2.2 mg/dL (ref 1.7–2.4)

## 2019-04-16 SURGERY — RIGHT/LEFT HEART CATH AND CORONARY ANGIOGRAPHY
Anesthesia: LOCAL

## 2019-04-16 MED ORDER — SODIUM CHLORIDE 0.9 % WEIGHT BASED INFUSION: 3.0000 mL/kg/h | INTRAVENOUS | Status: DC

## 2019-04-16 MED ORDER — ASPIRIN 81 MG PO CHEW: 81.0000 mg | CHEWABLE_TABLET | ORAL | Status: DC

## 2019-04-16 MED ORDER — SODIUM CHLORIDE 0.9 % WEIGHT BASED INFUSION: 1.0000 mL/kg/h | INTRAVENOUS | Status: DC

## 2019-04-16 MED ORDER — VERAPAMIL HCL 2.5 MG/ML IV SOLN
INTRAVENOUS | Status: DC | PRN
  Administered 2019-04-16: 10 mL via INTRA_ARTERIAL

## 2019-04-16 MED ORDER — LABETALOL HCL 5 MG/ML IV SOLN: 10.0000 mg | INTRAVENOUS | Status: AC | PRN

## 2019-04-16 MED ORDER — FENTANYL CITRATE (PF) 100 MCG/2ML IJ SOLN
INTRAMUSCULAR | Status: DC | PRN
  Administered 2019-04-16 (×2): 25 ug via INTRAVENOUS

## 2019-04-16 MED ORDER — ADULT MULTIVITAMIN W/MINERALS CH
1.0000 | ORAL_TABLET | Freq: Every day | ORAL | Status: DC
  Administered 2019-04-17 – 2019-04-18 (×2): 1 via ORAL
  Filled 2019-04-16 (×2): qty 1

## 2019-04-16 MED ORDER — HEPARIN (PORCINE) IN NACL 1000-0.9 UT/500ML-% IV SOLN
INTRAVENOUS | Status: DC | PRN
  Administered 2019-04-16 (×2): 500 mL

## 2019-04-16 MED ORDER — HEPARIN (PORCINE) IN NACL 1000-0.9 UT/500ML-% IV SOLN
INTRAVENOUS | Status: AC
  Filled 2019-04-16: qty 1000

## 2019-04-16 MED ORDER — IOHEXOL 350 MG/ML SOLN
INTRAVENOUS | Status: DC | PRN
  Administered 2019-04-16: 65 mL via INTRA_ARTERIAL

## 2019-04-16 MED ORDER — SODIUM CHLORIDE 0.9 % IV SOLN
INTRAVENOUS | Status: AC | PRN
  Administered 2019-04-16: 106 mL/h via INTRAVENOUS

## 2019-04-16 MED ORDER — ONDANSETRON HCL 4 MG/2ML IJ SOLN: 4.0000 mg | Freq: Four times a day (QID) | INTRAMUSCULAR | Status: DC | PRN

## 2019-04-16 MED ORDER — SODIUM CHLORIDE 0.9% FLUSH
3.0000 mL | Freq: Two times a day (BID) | INTRAVENOUS | Status: DC
  Administered 2019-04-16 – 2019-04-18 (×4): 3 mL via INTRAVENOUS

## 2019-04-16 MED ORDER — ACETAMINOPHEN 325 MG PO TABS: 650.0000 mg | ORAL_TABLET | ORAL | Status: DC | PRN

## 2019-04-16 MED ORDER — ENSURE ENLIVE PO LIQD
237.0000 mL | Freq: Two times a day (BID) | ORAL | Status: DC
  Administered 2019-04-18: 237 mL via ORAL

## 2019-04-16 MED ORDER — SPIRONOLACTONE 12.5 MG HALF TABLET
12.5000 mg | ORAL_TABLET | Freq: Every day | ORAL | Status: DC
  Administered 2019-04-16 – 2019-04-18 (×3): 12.5 mg via ORAL
  Filled 2019-04-16 (×3): qty 1

## 2019-04-16 MED ORDER — MIDAZOLAM HCL 2 MG/2ML IJ SOLN
INTRAMUSCULAR | Status: AC
  Filled 2019-04-16: qty 2

## 2019-04-16 MED ORDER — DIAZEPAM 5 MG PO TABS
5.0000 mg | ORAL_TABLET | Freq: Four times a day (QID) | ORAL | Status: DC | PRN
  Administered 2019-04-16 – 2019-04-17 (×2): 5 mg via ORAL
  Filled 2019-04-16 (×2): qty 1

## 2019-04-16 MED ORDER — LOSARTAN POTASSIUM 25 MG PO TABS
25.0000 mg | ORAL_TABLET | Freq: Every day | ORAL | Status: DC
  Administered 2019-04-16: 25 mg via ORAL
  Filled 2019-04-16: qty 1

## 2019-04-16 MED ORDER — LIDOCAINE HCL (PF) 1 % IJ SOLN
INTRAMUSCULAR | Status: AC
  Filled 2019-04-16: qty 30

## 2019-04-16 MED ORDER — ENOXAPARIN SODIUM 40 MG/0.4ML ~~LOC~~ SOLN
40.0000 mg | SUBCUTANEOUS | Status: DC
  Administered 2019-04-17 – 2019-04-18 (×2): 40 mg via SUBCUTANEOUS
  Filled 2019-04-16 (×2): qty 0.4

## 2019-04-16 MED ORDER — FUROSEMIDE 10 MG/ML IJ SOLN
40.0000 mg | Freq: Two times a day (BID) | INTRAMUSCULAR | Status: DC
  Administered 2019-04-16 – 2019-04-18 (×4): 40 mg via INTRAVENOUS
  Filled 2019-04-16 (×4): qty 4

## 2019-04-16 MED ORDER — ASPIRIN 81 MG PO CHEW
81.0000 mg | CHEWABLE_TABLET | Freq: Every day | ORAL | Status: DC
  Administered 2019-04-17 – 2019-04-18 (×2): 81 mg via ORAL
  Filled 2019-04-16 (×2): qty 1

## 2019-04-16 MED ORDER — METOPROLOL TARTRATE 12.5 MG HALF TABLET
12.5000 mg | ORAL_TABLET | Freq: Two times a day (BID) | ORAL | Status: DC
  Administered 2019-04-16 – 2019-04-17 (×2): 12.5 mg via ORAL
  Filled 2019-04-16 (×2): qty 1

## 2019-04-16 MED ORDER — SODIUM CHLORIDE 0.9% FLUSH: 3.0000 mL | INTRAVENOUS | Status: DC | PRN

## 2019-04-16 MED ORDER — FENTANYL CITRATE (PF) 100 MCG/2ML IJ SOLN
INTRAMUSCULAR | Status: AC
  Filled 2019-04-16: qty 2

## 2019-04-16 MED ORDER — SODIUM CHLORIDE 0.9 % IV SOLN
INTRAVENOUS | Status: DC
  Administered 2019-04-16: 17:00:00 via INTRAVENOUS

## 2019-04-16 MED ORDER — SODIUM CHLORIDE 0.9% FLUSH: 3.0000 mL | Freq: Two times a day (BID) | INTRAVENOUS | Status: DC

## 2019-04-16 MED ORDER — SODIUM CHLORIDE 0.9 % IV SOLN: 250.0000 mL | INTRAVENOUS | Status: DC | PRN

## 2019-04-16 MED ORDER — VERAPAMIL HCL 2.5 MG/ML IV SOLN
INTRAVENOUS | Status: AC
  Filled 2019-04-16: qty 2

## 2019-04-16 MED ORDER — HEPARIN SODIUM (PORCINE) 1000 UNIT/ML IJ SOLN
INTRAMUSCULAR | Status: DC | PRN
  Administered 2019-04-16: 5000 [IU] via INTRAVENOUS

## 2019-04-16 MED ORDER — ATORVASTATIN CALCIUM 80 MG PO TABS
80.0000 mg | ORAL_TABLET | Freq: Every day | ORAL | Status: DC
  Administered 2019-04-16 – 2019-04-17 (×2): 80 mg via ORAL
  Filled 2019-04-16 (×2): qty 1

## 2019-04-16 MED ORDER — ASPIRIN 81 MG PO CHEW
81.0000 mg | CHEWABLE_TABLET | Freq: Every day | ORAL | Status: DC
  Administered 2019-04-16: 81 mg via ORAL
  Filled 2019-04-16: qty 1

## 2019-04-16 MED ORDER — HEPARIN SODIUM (PORCINE) 1000 UNIT/ML IJ SOLN
INTRAMUSCULAR | Status: AC
  Filled 2019-04-16: qty 1

## 2019-04-16 MED ORDER — MIDAZOLAM HCL 2 MG/2ML IJ SOLN
INTRAMUSCULAR | Status: DC | PRN
  Administered 2019-04-16 (×2): 1 mg via INTRAVENOUS

## 2019-04-16 MED ORDER — LIDOCAINE HCL (PF) 1 % IJ SOLN
INTRAMUSCULAR | Status: DC | PRN
  Administered 2019-04-16: 2 mL via SUBCUTANEOUS

## 2019-04-16 MED ORDER — HYDRALAZINE HCL 20 MG/ML IJ SOLN: 10.0000 mg | INTRAMUSCULAR | Status: AC | PRN

## 2019-04-16 SURGICAL SUPPLY — 12 items
CATH 5FR JL3.5 JR4 ANG PIG MP (CATHETERS) ×1 IMPLANT
CATH BALLN WEDGE 5F 110CM (CATHETERS) ×1 IMPLANT
CATH OPTITORQUE TIG 4.0 5F (CATHETERS) ×1 IMPLANT
DEVICE RAD COMP TR BAND LRG (VASCULAR PRODUCTS) ×1 IMPLANT
GLIDESHEATH SLEND SS 6F .021 (SHEATH) ×1 IMPLANT
GUIDEWIRE INQWIRE 1.5J.035X260 (WIRE) IMPLANT
INQWIRE 1.5J .035X260CM (WIRE) ×2
KIT HEART LEFT (KITS) ×2 IMPLANT
PACK CARDIAC CATHETERIZATION (CUSTOM PROCEDURE TRAY) ×2 IMPLANT
SHEATH GLIDE SLENDER 4/5FR (SHEATH) ×1 IMPLANT
TRANSDUCER W/STOPCOCK (MISCELLANEOUS) ×2 IMPLANT
TUBING CIL FLEX 10 FLL-RA (TUBING) ×2 IMPLANT

## 2019-04-16 NOTE — Progress Notes (Signed)
PROGRESS NOTE  Timothy Lee [id number] DOB: 07-15-1950   PCP: Lauree Chandler, NP  Patient is from: Home  DOA: 04/14/2019 LOS: 1  Brief Narrative / Interim history: 68 year old male with history of DM-2, HTN, untreated OSA, 55-pack-year history and current smoker presenting with worsening shortness of breath and productive cough.  Recently returned from [facility] 2 weeks ago.  Treated outpatient with Z-Pak, Tessalon Perles and home albuterol without significant improvement in his breathing.  Called PCP who advised him to go to urgent care.  At UC, had rales, strong cough, tachycardia, DOE, desaturation to 89% on RA and CXR concerning for CHF.  He was referred to ED.   In ED, tachycardic, tachypneic and hypertensive.  Saturating at 95% on room air.  BMP unremarkable except for mildly elevated glucose.  BNP 288.  CBC unremarkable.  COVID-19 negative.  CTA chest negative for PE but concerning for CHF, mediastinal lymphadenopathy and heterogeneous left thyroid lobe nodule.  EKG with sinus tachycardia, LAE and nonspecific T wave changes.  Started on IV Lasix, IV Solu-Medrol and breathing treatments and admitted for acute hypoxic respiratory failure due to new CHF and COPD exacerbation.  Patient has no formal diagnosis of COPD but has been on as needed albuterol.  Echocardiogram on 04/15/2019 with LVEF of 35 to 40%,  regional wall motion abnormalities, global hypokinesis and moderately reduced RVEF.  Cardiology consulted.   Subjective: Had 7 beats of V. tach overnight.  Was not symptomatic.  He says he has not had good sleep last night but reports improvement in his breathing and cough.  He denies chest pain, orthopnea, PND, palpitation, GI or UTI symptoms.  Had good urine output.  Objective: Vitals:   04/16/19 0015 04/16/19 0449 04/16/19 0736 04/16/19 0817  BP: (!) 123/91 95/76  (!) 131/96  Pulse: 89 95  (!) 104  Resp:  20    Temp:  97.6 F (36.4 C)    TempSrc:  Oral    SpO2: 98%  95% 92% 94%  Weight:  106 kg    Height:        Intake/Output Summary (Last 24 hours) at 04/16/2019 0951 Last data filed at 04/16/2019 [id number] Gross per 24 hour  Intake 776 ml  Output 1700 ml  Net -924 ml   Filed Weights   04/14/19 1727 04/15/19 1127 04/16/19 0449  Weight: 110.2 kg 107.2 kg 106 kg    Examination:  GENERAL: No acute distress.  Appears well.  HEENT: MMM.  Vision and hearing grossly intact.  NECK: Supple.  No apparent JVD.  RESP:  No IWOB. Good air movement bilaterally. CVS:  RRR. Heart sounds normal.  ABD/GI/GU: Bowel sounds present. Soft. Non tender.  MSK/EXT:  Moves extremities. No apparent deformity or edema.  SKIN: no apparent skin lesion or wound NEURO: Awake, alert and oriented appropriately.  No apparent focal neuro deficit. PSYCH: Calm. Normal affect.  Assessment & Plan: Acute hypoxic respiratory failure: Likely a combination of new systolic CHF and COPD exacerbation.  Echocardiogram as below. PE and pneumonia excluded by CTA chest.  Has no fever or leukocytosis to suggest infectious process.  COVID-19 negative. -Wean oxygen as able -Treat treatable causes.  Acute systolic CHF: Echo with LVEF of 35 to 40%,   RWMA, global hypokinesis and moderately reduced RVEF.  Cardiology consulted.  Good response to IV Lasix.  2.6 L UOP with net -2 L in the last 24 hours.  Renal function stable. -Follow cardiology inputs -Continue IV Lasix at 20  mg twice daily -On metoprolol and losartan for GDMT. -Monitor fluid status, renal function and electrolytes -Sodium and fluid restriction.  COPD exacerbation: Has cardinal symptoms and diminished aeration bilaterally.  No formal diagnosis but on albuterol.  Has 55-pack-year history and is still smoking. -Continue IV Solu-Medrol and doxycycline -Add budesonide, Brovana and as needed DuoNeb-COVID-19 negative. -Incentive spirometry and mucolytic's.  Coronary atherosclerosis: Noted on CTA chest.  Now with new systolic CHF -May  need heart catheterization but defer this to cardiology. -Is n.p.o. per cardiology recs  Heterogeneous left thyroid nodules: hot nodules? He has low TSH and mildly elevated T4.  No history of thyroid disease.  Thyroid ultrasound revealed 1.1 cm left superior, 0.7 cm right mid and 0.6 m left inferior nodules.  Follow-up ultrasound in 1 year recommended for the bigger nodule. -Metoprolol as above  Uncontrolled NIDDM-2 with hyperglycemia: A1c 7.4%. CBG (last 3)  Recent Labs    04/15/19 1659 04/15/19 2105 04/16/19 0632  GLUCAP 263* 182* 185*  -Continue Lantus 5 units twice daily, NovoLog 5 units AC and SSI -Started atorvastatin.  Tachycardia: could be due to albuterol or hyperthyroidism.  Improved. -Low-dose metoprolol as above.  Tobacco use disorder: 55-pack-year history and still smoking but started cutting down. -Encourage cessation -Continue NicoDerm.  Anxiety: Stable -Continue home Valium  History of bladder cancer status post local excision -Outpatient follow-up  Essential hypertension: -Continue home amlodipine.  Added low-dose metoprolol and losartan here -Home benazepril on hold.  Leukocytosis: Likely demargination from steroid.               DVT prophylaxis: Subcu Lovenox Code Status: Full code Family Communication: Patient and/or RN. Available if any question. Disposition Plan: Remains inpatient evaluation and management of new systolic CHF and COPD exacerbation. Consultants: Cardiology  Procedures:  None  Microbiology summarized: COVID-19 screen negative.  Sch Meds:  Scheduled Meds:  amLODipine  5 mg Oral Daily   arformoterol  15 mcg Nebulization BID   atorvastatin  20 mg Oral q1800   baclofen  5 mg Oral BID   benzonatate  100 mg Oral TID   budesonide (PULMICORT) nebulizer solution  0.25 mg Nebulization BID   doxycycline  100 mg Oral Q12H   enoxaparin (LOVENOX) injection  40 mg Subcutaneous Q24H   furosemide  20 mg Intravenous Q12H     guaiFENesin  600 mg Oral BID   insulin aspart  0-15 Units Subcutaneous TID WC   insulin aspart  0-5 Units Subcutaneous QHS   insulin aspart  5 Units Subcutaneous TID WC   insulin glargine  5 Units Subcutaneous BID   losartan  25 mg Oral Daily   mouth rinse  15 mL Mouth Rinse BID   methylPREDNISolone (SOLU-MEDROL) injection  40 mg Intravenous Q8H   metoprolol tartrate  12.5 mg Oral BID   nicotine  21 mg Transdermal Daily   sodium chloride flush  3 mL Intravenous Q12H   Continuous Infusions: PRN Meds:.acetaminophen, diazepam, ipratropium-albuterol, ondansetron **OR** ondansetron (ZOFRAN) IV  Antimicrobials: Anti-infectives (From admission, onward)   Start     Dose/Rate Route Frequency Ordered Stop   04/15/19 1430  doxycycline (VIBRA-TABS) tablet 100 mg     100 mg Oral Every 12 hours 04/15/19 1426         I have personally reviewed the following labs and images: CBC: Recent Labs  Lab 04/14/19 1729 04/16/19 0420  WBC 10.4 18.1*  HGB 15.5 14.9  HCT 46.8 43.9  MCV 90.2 88.2  PLT 366 345  BMP &GFR Recent Labs  Lab 04/14/19 1630 04/14/19 1729 04/15/19 0432 04/16/19 0420  NA 138 138 138 138  K 4.1 4.1 3.8 4.3  CL 102 103 100 99  CO2 25 25 26 28   GLUCOSE 124* 139* 200* 207*  BUN 10 10 9 12   CREATININE 1.00 0.99 0.84 0.84  CALCIUM 9.6 9.2 9.4 9.6  MG  --   --  2.2 2.2   Estimated Creatinine Clearance: 107.4 mL/min (by C-G formula based on SCr of 0.84 mg/dL). Liver & Pancreas: No results for input(s): AST, ALT, ALKPHOS, BILITOT, PROT, ALBUMIN in the last 168 hours. No results for input(s): LIPASE, AMYLASE in the last 168 hours. No results for input(s): AMMONIA in the last 168 hours. Diabetic: Recent Labs    04/15/19 0432  HGBA1C 7.4*   Recent Labs  Lab 04/15/19 0753 04/15/19 1122 04/15/19 1659 04/15/19 2105 04/16/19 0632  GLUCAP 171* 250* 263* 182* 185*   Cardiac Enzymes: No results for input(s): CKTOTAL, CKMB, CKMBINDEX, TROPONINI in the  last 168 hours. No results for input(s): PROBNP in the last 8760 hours. Coagulation Profile: No results for input(s): INR, PROTIME in the last 168 hours. Thyroid Function Tests: Recent Labs    04/15/19 0901  TSH 0.303*  FREET4 1.23*   Lipid Profile: Recent Labs    04/16/19 0420  CHOL 156  HDL 45  LDLCALC 101*  TRIG 48  CHOLHDL 3.5   Anemia Panel: No results for input(s): VITAMINB12, FOLATE, FERRITIN, TIBC, IRON, RETICCTPCT in the last 72 hours. Urine analysis: No results found for: COLORURINE, APPEARANCEUR, LABSPEC, PHURINE, GLUCOSEU, HGBUR, BILIRUBINUR, KETONESUR, PROTEINUR, UROBILINOGEN, NITRITE, LEUKOCYTESUR Sepsis Labs: Invalid input(s): PROCALCITONIN, [place]  Microbiology: Recent Results (from the past 240 hour(s))  SARS CORONAVIRUS 2 (TAT 6-24 HRS) Nasopharyngeal Nasopharyngeal Swab     Status: None   Collection Time: 04/14/19  7:15 PM   Specimen: Nasopharyngeal Swab  Result Value Ref Range Status   SARS Coronavirus 2 NEGATIVE NEGATIVE Final    Comment: (NOTE) SARS-CoV-2 target nucleic acids are NOT DETECTED. The SARS-CoV-2 RNA is generally detectable in upper and lower respiratory specimens during the acute phase of infection. Negative results do not preclude SARS-CoV-2 infection, do not rule out co-infections with other pathogens, and should not be used as the sole basis for treatment or other patient management decisions. Negative results must be combined with clinical observations, patient history, and epidemiological information. The expected result is Negative. Fact Sheet for Patients: SugarRoll.be Fact Sheet for Healthcare Providers: https://www.woods-mathews.com/ This test is not yet approved or cleared by the [place] FDA and  has been authorized for detection and/or diagnosis of SARS-CoV-2 by FDA under an Emergency Use Authorization (EUA). This EUA will remain  in effect (meaning this test can be  used) for the duration of the COVID-19 declaration under Section 56 4(b)(1) of the Act, 21 U.S.C. section 360bbb-3(b)(1), unless the authorization is terminated or revoked sooner. Performed at [facility], [place] [address] [place], [place] [address]     Radiology Studies: US Thyroid  Result Date: 04/16/2019 CLINICAL DATA:  Incidental on CT. Enlargement of left lobe of the thyroid gland by CT. EXAM: THYROID ULTRASOUND TECHNIQUE: Ultrasound examination of the thyroid gland and adjacent soft tissues was performed. COMPARISON:  CTA of the chest on 04/14/2019 FINDINGS: Parenchymal Echotexture: Mildly heterogenous Isthmus: 0.9 cm Right lobe: 3.8 x 2.0 x 1.4 cm Left lobe: 3.1 x 1.9 x 1.7 cm (mediastinal extension of left thyroid goiter likely not fully visualized  by ultrasound) _________________________________________________________ Estimated total number of nodules >/= 1 cm: 1 Number of spongiform nodules >/=  2 cm not described below (TR1): 0 Number of mixed cystic and solid nodules >/= 1.5 cm not described below ([place]): 0 _________________________________________________________ Nodule # 1: Location: Right; Mid Maximum size: 0.7 cm; Other 2 dimensions: 0.7 x 0.7 cm Composition: spongiform (0) Echogenicity: anechoic (0) Shape: not taller-than-wide (0) Margins: smooth (0) Echogenic foci: none (0) ACR TI-RADS total points: 0. ACR TI-RADS risk category: TR1 (0-1 points). ACR TI-RADS recommendations: This nodule does NOT meet TI-RADS criteria for biopsy or dedicated follow-up. _________________________________________________________ Nodule # 2: Location: Left; Superior Maximum size: 1.1 cm; Other 2 dimensions: 0.8 x 0.9 cm Composition: solid/almost completely solid (2) Echogenicity: hypoechoic (2) Shape: not taller-than-wide (0) Margins: smooth (0) Echogenic foci: none (0) ACR TI-RADS total points: 4. ACR TI-RADS risk category: TR4 (4-6 points). ACR TI-RADS recommendations: *Given size (>/= 1 - 1.4 cm)  and appearance, a follow-up ultrasound in 1 year should be considered based on TI-RADS criteria. _________________________________________________________ Nodule # 3: Location: Left; Inferior Maximum size: 0.6 cm; Other 2 dimensions: 0.6 x 0.6 cm Composition: mixed cystic and solid (1) Echogenicity: isoechoic (1) Shape: not taller-than-wide (0) Margins: ill-defined (0) Echogenic foci: none (0) ACR TI-RADS total points: 2. ACR TI-RADS risk category: TR2 (2 points). ACR TI-RADS recommendations: This nodule does NOT meet TI-RADS criteria for biopsy or dedicated follow-up. _________________________________________________________ No abnormal lymph nodes identified. IMPRESSION: 1.1 cm left superior thyroid nodule meets criteria for 1 year follow-up ultrasound. 0.7 cm right mid and 0.6 cm left inferior nodules do not meet criteria for biopsy or further follow-up. The above is in keeping with the ACR TI-RADS recommendations - J Am Coll Radiol 2017;14:587-595. Electronically Signed   By: Aletta Edouard [name]   On: 04/16/2019 08:28   35 minutes with more than 50% spent in reviewing records, counseling patient/family and coordinating care.  Petar Mucci T. Livingston  If 7PM-7AM, please contact night-coverage [email or web address] Password [facility] 04/16/2019, 9:51 AM

## 2019-04-16 NOTE — Consult Note (Signed)
Cardiology Consultation:   Patient ID: Timothy Lee; [id number]; Jan 11, 1951   Admit date: 04/14/2019 Date of Consult: 04/16/2019  Primary Care Provider: Lauree Chandler, NP Primary Cardiologist: New to [facility]- Dr. Fransico Him, MD   Patient Profile:   Timothy Lee is a 68 y.o. male with a hx of hypertension, DM2, untreated OSA and longstanding tobacco use who is being seen today for the evaluation of acute CHF exacerbation and presumed ischemic cardiomyopathy at the request of Dr. Cyndia Skeeters.  History of Present Illness:   Timothy Lee is a 68 year old male with a history stated above who presented to [facility] on 04/15/2019 from an urgent care center with progressive shortness of breath and profound fatigue. Patient reports he was in his usual state of health until last Wednesday morning.  He states he he went to sleep Tuesday night in his downstairs basement and woke up on Wednesday morning with acute dyspnea.  He reports it took him 4 attempts to make it up the 18 stairs to the first floor secondary to exertioanl DOE. He states he began taking his daughters albuterol inhaler which initially gave him relief however over the course of the week, his symptoms would progressively return. Given his symptoms, he had a telehealth visit with his PCP who dx him with COPD exacerbation and prescribed antibiotic therapy. He reports this helped his cough however did not resolve his shortness of breath. Given his persistent symptoms, he presented to an urgent care center where he was found to be tachycardic and hypoxic with an SPO2 of 89% with ambulation. CXR demonstrated mild cardiomegaly with small pleural effusions and interstitial thickening, suspicious for pulmonary edema.  He was then referred to the ED for further evaluation. He was also noted to have recently returned from a trip to [facility] 2 weeks ago. He has no personal or family hx of CAD. Has significant CRF's including HTN, DM2 and tobacco use.      On ED arrival, BNP was found to be elevated at 288.  COVID-19 negative.  CTA performed to rule out PE which was negative however demonstrated bilateral pleural effusions concerning for CHF. CTA also showed coronary calcifications. EKG with sinus tachycardia, Q waves in anteroseptal and nonspecific T wave changes.  He was started on IV Lasix, IV Solu-Medrol and breathing treatments.  He was admitted to hospitalist service in which an echocardiogram was obtained. Unfortunately, this showed a reduced LV function at 35-40% with global hypokinesis.  He denies chest pain or other anginal symptoms. Reports SOB is greatly improved. No recurrence since admission. Denies orthopnea, LE swelling, diaphoresis, N/V, palpitations, pre-syncopal or syncopal episodes.    Past Medical History:  Diagnosis Date   Arthritis    Bladder cancer [facility]) urologist-  dr Kipp Brood   Borderline diabetes    Bursitis of both shoulders    Diabetes mellitus without complication ([place])    Disorder of male genital organs, unspecified    Esophagus disorder    Constricted Esophagus, per New Patient Packet-PSC    History of basal cell carcinoma (BCC) excision    05/ 2018  right forehead   Hypertension    Mild acid reflux no meds   Nocturia    OSA (obstructive sleep apnea)    per pt has not used cpap in years   Productive cough    from smoking   Renal cyst, left 11/2011   16 mm   Sleep apnea    Per New Patient Packet-PSC  Smokers' cough ([place])    SOB (shortness of breath) on exertion     Past Surgical History:  Procedure Laterality Date   CYSTOSCOPY WITH BIOPSY  03/13/2012   Procedure: CYSTOSCOPY WITH BIOPSY;  Surgeon: Fredricka Bonine, MD;  Location: [facility];  Service: Urology;  Laterality: N/A;      ESOPHAGEAL DILATION  05/20/1986   Esophagus Stretch, per New Patient Packet-PSC    TONSILLECTOMY  age 2   TRANSURETHRAL RESECTION OF BLADDER TUMOR N/A 05/23/2017    Procedure: TRANSURETHRAL RESECTION OF BLADDER TUMOR / (TURBT) WITH INSTILLATION OF EPIRUBICIN;  Surgeon: [name], MD;  Location: [facility];  Service: Urology;  Laterality: N/A;   UVULECTOMY  05/20/1990   Dr.Byers, per new patient packet    UVULOPALATOPHARYNGOPLASTY  2005   osa     Prior to Admission medications   Medication Sig Start Date End Date Taking? Authorizing Provider  acetaminophen (TYLENOL) 325 MG tablet Take 650 mg by mouth every 6 (six) hours as needed for moderate pain.    Yes [provider]  albuterol (VENTOLIN HFA) 108 (90 Base) MCG/ACT inhaler Inhale 2 puffs into the lungs every 6 (six) hours as needed for wheezing or shortness of breath. 04/09/19  Yes Ngetich, Dinah C, NP  amLODipine-benazepril (LOTREL) 5-20 MG capsule Take 1 capsule by mouth every morning. 01/12/19  Yes Lauree Chandler, NP  Baclofen 5 MG TABS Take 5 mg by mouth daily.    Yes [provider]  ibuprofen (ADVIL,MOTRIN) 200 MG tablet Take 200 mg by mouth every 6 (six) hours as needed for mild pain.    Yes [provider]  metFORMIN (GLUCOPHAGE) 1000 MG tablet Take 1 tablet (1,000 mg total) by mouth 2 (two) times daily with a meal. 09/28/18  Yes Lauree Chandler, NP    Inpatient Medications: Scheduled Meds:  amLODipine  5 mg Oral Daily   arformoterol  15 mcg Nebulization BID   atorvastatin  20 mg Oral q1800   baclofen  5 mg Oral BID   benzonatate  100 mg Oral TID   budesonide (PULMICORT) nebulizer solution  0.25 mg Nebulization BID   doxycycline  100 mg Oral Q12H   enoxaparin (LOVENOX) injection  40 mg Subcutaneous Q24H   furosemide  20 mg Intravenous Q12H   guaiFENesin  600 mg Oral BID   insulin aspart  0-15 Units Subcutaneous TID WC   insulin aspart  0-5 Units Subcutaneous QHS   insulin aspart  5 Units Subcutaneous TID WC   insulin glargine  5 Units Subcutaneous BID   mouth rinse  15 mL Mouth Rinse BID   methylPREDNISolone  (SOLU-MEDROL) injection  40 mg Intravenous Q8H   metoprolol tartrate  25 mg Oral BID   nicotine  21 mg Transdermal Daily   sodium chloride flush  3 mL Intravenous Q12H   Continuous Infusions:  PRN Meds: acetaminophen, diazepam, ipratropium-albuterol, ondansetron **OR** ondansetron (ZOFRAN) IV  Allergies:    Allergies  Allergen Reactions   Chantix [Varenicline Tartrate]     Aggression     Social History:   Social History   Socioeconomic History   Marital status: Married    Spouse name: Not on file   Number of children: Not on file   Years of education: Not on file   Highest education level: Not on file  Occupational History   Not on file  Social Needs   Financial resource strain: Not on file   Food insecurity  Worry: Not on file    Inability: Not on file   Transportation needs    Medical: Not on file    Non-medical: Not on file  Tobacco Use   Smoking status: Current Every Day Smoker    Packs/day: 0.10    Years: 47.00    Pack years: 4.70    Types: Cigarettes   Smokeless tobacco: Never Used   Tobacco comment: cutting back  Substance and Sexual Activity   Alcohol use: Yes    Comment: very seldom- rare   Drug use: Yes    Types: Marijuana    Comment: seldom-rare   Sexual activity: Not Currently  Lifestyle   Physical activity    Days per week: Not on file    Minutes per session: Not on file   Stress: Not on file  Relationships   Social connections    Talks on phone: Not on file    Gets together: Not on file    Attends religious service: Not on file    Active member of club or organization: Not on file    Attends meetings of clubs or organizations: Not on file    Relationship status: Not on file   Intimate partner violence    Fear of current or ex partner: Not on file    Emotionally abused: Not on file    Physically abused: Not on file    Forced sexual activity: Not on file  Other Topics Concern   Not on file  Social History  Narrative   Diet: None      Caffeine: Yes      Married, if yes what year:  Yes, 1990      Do you live in a house, apartment, assisted living, condo, trailer, ect: House, 3 stories, 4 persons      Pets: 2 dogs, 2 cats       Highest level of education: Some Facilities manager profession: Clinical cytogeneticist       Exercise: Some         Living Will: Yes   DNR: Yes   POA/HPOA: Yes      Functional Status:   Do you have difficulty bathing or dressing yourself? No   Do you have difficulty preparing food or eating? No   Do you have difficulty managing your medications? No   Do you have difficulty managing your finances? No   Do you have difficulty affording your medications? No    Family History:   Family History  Problem Relation Age of Onset   Diabetes Mother    Dementia Mother    Stroke Mother    Arthritis Mother    Hypertension Mother    Multiple sclerosis Father    Diabetes Father    Diabetes Sister    Hypertension Sister    ADD / ADHD Son    Migraines Daughter    ADD / ADHD Daughter    Cancer Maternal Grandmother    Family Status:  Family Status  Relation Name Status   Mother Nancie Neas Alive   Father Jmichael Deceased at age 66s       MS   Sister Comerio   Daughter Clarise Cruz Alive   MGM  (Not Specified)    ROS:  Please see the history of present illness.  All other ROS reviewed and negative.     Physical Exam/Data:   Vitals:   04/15/19 2158 04/16/19 0015 04/16/19 0449 04/16/19 [id number]  BP: 125/82 (!) 123/91 95/76   Pulse: 99 89 95   Resp:   20   Temp:   97.6 F (36.4 C)   TempSrc:   Oral   SpO2: 94% 98% 95% 92%  Weight:   106 kg   Height:        Intake/Output Summary (Last 24 hours) at 04/16/2019 0758 Last data filed at 04/16/2019 [id number] Gross per 24 hour  Intake 776 ml  Output 2650 ml  Net -1874 ml   Filed Weights   04/14/19 1727 04/15/19 1127 04/16/19 0449  Weight: 110.2 kg 107.2 kg 106 kg   Body mass index  is 31.68 kg/m.   General: Well developed, NAD Neck: No JVD Lungs. No wheezes, BL rhonchi. Breathing is unlabored. Cardiovascular: RRR with S1 S2. No murmurs Abdomen: Soft, non-tender, non-distended. No obvious abdominal masses. MSK: Strength and tone appear normal for age. 5/5 in all extremities Extremities: No edema. No clubbing or cyanosis. DP pulses 2+ bilaterally Neuro: Alert and oriented. No focal deficits. No facial asymmetry. MAE spontaneously. Psych: Responds to questions appropriately with normal affect.     EKG:  The EKG was personally reviewed and demonstrates: 04/14/2019 ST with HR 120bpm, Q waves in anteroseptal leads with non-specific T wave abnormalities. No acute ST segment changes.  Telemetry:  Telemetry was personally reviewed and demonstrates:  NSR/ST HR 90-100's   Relevant CV Studies:  Echocardiogram 04/15/2019:   1. Left ventricular ejection fraction, by visual estimation, is 35 to 40%. The left ventricle has severely decreased function. There is no left ventricular hypertrophy. Global hypokinesis. Though not optimally visualized, there appears to be relatively  worse function of the lateral LV wall. Mildly dilated left ventricular internal cavity size.  2. Left ventricular diastolic parameters are indeterminate.  3. Global right ventricle has moderately reduced systolic function.The right ventricular size is normal. No increase in right ventricular wall thickness.  4. Left atrial size was normal.  5. Right atrial size was normal.  6. The mitral valve is normal in structure. Mild mitral valve regurgitation. No evidence of mitral stenosis.  7. The tricuspid valve is normal in structure. Tricuspid valve regurgitation is trivial.  8. The aortic valve is normal in structure. Aortic valve regurgitation is not visualized. Mild aortic valve sclerosis without stenosis.  9. The pulmonic valve was not well visualized. Pulmonic valve regurgitation is trivial. 10. The inferior  vena cava is dilated in size with >50% respiratory variability, suggesting right atrial pressure of 8 mmHg. 11. Trivial pericardial effusion is present.  Laboratory Data:  Chemistry Recent Labs  Lab 04/14/19 1729 04/15/19 0432 04/16/19 0420  NA 138 138 138  K 4.1 3.8 4.3  CL 103 100 99  CO2 25 26 28   GLUCOSE 139* 200* 207*  BUN 10 9 12   CREATININE 0.99 0.84 0.84  CALCIUM 9.2 9.4 9.6  GFRNONAA >60 >60 >60  GFRAA >60 >60 >60  ANIONGAP 10 12 11     Total Protein  Date Value Ref Range Status  01/04/2019 6.2 6.1 - 8.1 g/dL Final   AST  Date Value Ref Range Status  01/04/2019 13 10 - 35 U/L Final   ALT  Date Value Ref Range Status  01/04/2019 22 9 - 46 U/L Final   Alkaline Phosphatase  Date Value Ref Range Status  05/27/2013 61 25 - 125 Final   Total Bilirubin  Date Value Ref Range Status  01/04/2019 1.5 (H) 0.2 - 1.2 mg/dL Final   Hematology Recent Labs  Lab  04/14/19 1729 04/16/19 0420  WBC 10.4 18.1*  RBC 5.19 4.98  HGB 15.5 14.9  HCT 46.8 43.9  MCV 90.2 88.2  MCH 29.9 29.9  MCHC 33.1 33.9  RDW 11.9 12.0  PLT 366 345   Cardiac EnzymesNo results for input(s): TROPONINI in the last 168 hours. No results for input(s): TROPIPOC in the last 168 hours.  BNP Recent Labs  Lab 04/14/19 1645  BNP 288.0*    DDimer No results for input(s): DDIMER in the last 168 hours. TSH:  Lab Results  Component Value Date   TSH 0.303 (L) 04/15/2019   Lipids: Lab Results  Component Value Date   CHOL 156 04/16/2019   HDL 45 04/16/2019   LDLCALC 101 (H) 04/16/2019   TRIG 48 04/16/2019   CHOLHDL 3.5 04/16/2019   HgbA1c: Lab Results  Component Value Date   HGBA1C 7.4 (H) 04/15/2019    Radiology/Studies:  Dg Chest 2 View  Result Date: 04/14/2019 CLINICAL DATA:  Cough and shortness of breath despite taking azithromycin. Smoker. EXAM: CHEST - 2 VIEW COMPARISON:  Radiograph 09/18/2017 FINDINGS: Mild cardiomegaly. Unchanged aortic tortuosity compared to prior exam.  Interstitial thickening most consistent with pulmonary edema. Small bilateral pleural effusions. No confluent airspace disease. No pneumothorax. No acute osseous abnormalities are seen. Mild wedging of lower thoracic vertebra is unchanged. IMPRESSION: Mild cardiomegaly with small pleural effusions. Interstitial thickening suspicious for pulmonary edema. Findings are most consistent with CHF. Electronically Signed   By: Keith Rake [name]   On: 04/14/2019 15:38   Ct Angio Chest Pe W And/or Wo Contrast  Result Date: 04/14/2019 CLINICAL DATA:  Chest pain shortness of breath for 6 days, cough EXAM: CT ANGIOGRAPHY CHEST WITH CONTRAST TECHNIQUE: Multidetector CT imaging of the chest was performed using the standard protocol during bolus administration of intravenous contrast. Multiplanar CT image reconstructions and MIPs were obtained to evaluate the vascular anatomy. CONTRAST:  127mL OMNIPAQUE IOHEXOL 350 MG/ML SOLN COMPARISON:  Chest radiograph 04/04/2019 FINDINGS: Cardiovascular: Satisfactory opacification the pulmonary arteries to the segmental level. No pulmonary artery filling defects are identified. Central pulmonary arteries are normal caliber. No elevation of the RV/LV ratio (0.73). Mild cardiomegaly. Trace pericardial fluid. Coronary artery calcifications are noted. Thoracic aortic caliber is at the upper limits of normal. Minimal atherosclerotic plaque in the aorta. The left vertebral artery arises directly from the aortic arch. Minimal atherosclerotic plaque in the great vessels. Mediastinum/Nodes: Low-attenuation mediastinal and hilar nodes are present. Several larger low-attenuation nodes include a 12 mm subcarinal lymph node (5/73), a 11 mm left carinal lymph node (5/61) and a 12 mm right carinal lymph node (5/70). There is a heterogeneously enlarged left lobe thyroid gland. Discrete nodularity difficult to ascertain. Bowing of the trachea compatible with imaging during exhalation. Mild diffuse  peribronchovascular thickening is noted. No acute abnormality of the esophagus. Lungs/Pleura: Moderate bilateral effusions with adjacent passive atelectasis in the airless lung. Additional fluid seen tracking into the fissures with adjacent passive atelectasis of the upper lobes as well. Interlobular septal thickening is noted in the lung bases and apices with some central peribronchovascular opacity. Interspersed areas of ground-glass opacity are noted. No pneumothorax. Upper Abdomen: Mild bilateral symmetric perinephric stranding, a nonspecific finding though may correlate with either age or decreased renal function. No acute abnormalities present in the visualized portions of the upper abdomen. Musculoskeletal: T11 anterior wedging is similar to comparison from 2018. Multilevel degenerative changes are present in the imaged portions of the spine. No acute osseous abnormality or suspicious osseous  lesion. No suspicious chest wall lesions. Review of the MIP images confirms the above findings. IMPRESSION: 1. No evidence of pulmonary embolism. 2. Moderate bilateral pleural effusions with adjacent passive atelectasis in the airless lung. 3. Interlobular septal thickening in the lung bases and apices with some central peribronchovascular and ground-glass opacity, most suggestive of pulmonary edema. 4. Low-attenuation mediastinal and hilar nodes, likely reactive/edematous. 5. Heterogeneous, enlarged left lobe thyroid gland. Discrete nodularity difficult to ascertain. Consider further evaluation with thyroid ultrasound. This follows consensus guidelines: Managing Incidental Thyroid Nodules Detected on Imaging: White Paper of the ACR Incidental Thyroid Findings Committee. J Am Coll Radiol 2015; 12:143-150. and [facility] 3-tiered system for managing ITNs: J Am Coll Radiol. 2015; Feb;12(2): 143-50 6. Aortic Atherosclerosis (ICD10-I70.0). 7. Cardiomegaly.  Coronary atherosclerosis. Electronically Signed   By: Lovena Le [name]    On: 04/14/2019 22:54    Assessment and Plan:   1. Acute CHF exacerbation: -Patient presented to an outpatient urgent care after a week ling, acute onset of respiratory symptoms including SOB with rales, rhonchi and SPO2 less than 90%. CXR concerning for CHF therefore was referred to the ED for further evaluation. -CTA on arrival to rule out PE was negative however again with concern for acute CHF>>noted to have coronary calcifications on imagining  -Given IV Lasix with good diuresis>>improved symptoms  -ContinueIV Lasix 20 mg twice daily? -I&O, net negative 1.8L since admission  -Weight, 233lb today>>>244lb on presentation -Echocardiogram performed 04/15/2019 with LVEF at 35 to 40% with global hypokinesis and left ventricular regional wall motion abnormalities -Continue with IV Lasix 20mg  BID given good response -Will start low dose losartan given reduced LV function -Decrease metoprolol to 12.5mg  BID for now to allow for medication titration  -Creatinine stable at 0.84  2. Coronary atherosclerosis with reduced LV function: -No prior history of CAD however with coronary atherosclerosis on CTA and CRF's including HTN. DM2 and (55 year) tobacco use history -Echocardiogram with reduced LV function at 35-40% and global  hypokinesis -Denies anginal symptoms -Would benefit from further work-up with either stress testing or LHC.  -Will start ASA therapy -Reduce metoprolol to 12.5mg  twice daily>> could consider transition to bisoprolol given pulmonary history -Will add low dose losartan and titrate as tolerated. May benefit from [facility] once cardiomyopathy more clearly defined.   -Continue statin>> may need to increase to high intensity based on further cardiac work-up  3. COPD exacerbation: -Presented with worsening shortness of breath and productive cough with long, 55-pack-year history of tobacco abuse -Started on IV Solu-Medrol, inhalers with antibiotics per primary team -COVID-19  negative -Further management per primary team  4. Uncontrolled DM2: -Hemoglobin A1c, 7.4 -Diagnose in 05/2018 -SSI for glucose control while inpatient status -Per primary team  5. Tobacco use: -55-year pack tobacco history -Smoking cessation strongly encouraged given high risk for CAD  6. HLD: -LDL, 101 from 04/16/2019 -Statin initiated per primary team  7. HTN: -Stable, 131/96>123/91>125/82>122/83 -Reduce metoprolol to 12.5 BID, add losartan 25mg  -Continue amlodipine 5 for now and monitor BP closely   For questions or updates, please contact [place] Please consult [email or web address] for contact info under Cardiology/STEMI.   SignedKathyrn Drown NP-C [facility] Pager: [phone] 04/16/2019 7:58 AM

## 2019-04-16 NOTE — Progress Notes (Signed)
Pt.7 beats of Vtach ;Pt.was asymptomatic ;B/P=123/91;HR=89;O2 SAT=98% on 3lnc.MD on call Kennon Holter was made aware;no orders received.Will continue to monitor pt.

## 2019-04-16 NOTE — Progress Notes (Signed)
Initial Nutrition Assessment  DOCUMENTATION CODES:   Obesity unspecified  INTERVENTION:   -MVI with minerals daily -Ensure Enlive po BID, each supplement provides 350 kcal and 20 grams of protein  NUTRITION DIAGNOSIS:   Increased nutrient needs related to acute illness(pleural effusions) as evidenced by estimated needs.  GOAL:   Patient will meet greater than or equal to 90% of their needs  MONITOR:   PO intake, Supplement acceptance, Diet advancement, Labs, Weight trends, Skin, I & O's  REASON FOR ASSESSMENT:   Malnutrition Screening Tool    ASSESSMENT:   Timothy Lee is a 68 y.o. male with a history of HTN, T2DM, untreated OSA, longstanding tobacco use who presented to the ED from urgent care 11/25 with shortness of breath. He had flown back from a trip to [facility] 2 weeks ago and developed moderate, constant shortness of breath that developed over the course of the morning on 11/19. He had a telehealth visit with his PCP who prescribed azithromycin and tessalon which did help the cough which is productive and associated with post nasal drip, but did not help the shortness of breath. He's taken other OTC medications with little improvement, and an albuterol inhaler which improved symptoms for several hours at a time. When this improvement lessened, he sought care at urgent care. There he had rales and strong cough, tachycardia, and was hypoxic to 89% with ambulation with significant dyspnea. CXR demonstrated mild cardiomegaly with small pleural effusions.  Pt admitted with acute respiratory failure with hypoxia.   Reviewed I/O's: -1.9 L x 24 hours   UOP: 2.7 L x 24 hours  Per cardiology notes, possible plan for heart cath today. Pt currently NPO.   Noted pt in cath lab at time of visit. RD unable to speak with pt to obtain further nutrition history at this time.   Prior to today, pt was on a heart healthy/ carb modified diet with fair appetite. Noted meal completion  50-75%.  Per wt hx, pt has experienced a 3.8% wt loss over the past month, which is not significant for time frame.   Medications reviewed and include solu-medrol.   Pt with increased nutritional needs related to chronic illness and would benefit from nutrient dense supplement. One Ensure Enlive supplement provides 350 kcals, 20 grams protein, and 44-45 grams of carbohydrate vs one Glucerna shake supplement, which provides 220 kcals, 10 grams of protein, and 26 grams of carbohydrate. Given pt's hx of DM, RD will continue to monitor PO intake, CBGS, and adjust supplement regimen as appropriate.   Lab Results  Component Value Date   HGBA1C 7.4 (H) 04/15/2019   PTA DM medications are 1000 mg metformin BID. Per ADA's Standards of Medical Care for Diabetes, glycemic targets for elderly patients who are otherwise healthy (few medical impairments) and cognitively intact should be less stringent (Hgb A1c <7.5).   Labs reviewed: CBGS: 182-185 (inpatient orders for glycemic control are 0-15 units insulin aspart TID with meals, 0-5 units insulin aspart q HS, 5 units insulin aspart TID with meals, and 5 units insulin glargine BID).   Diet Order:   Diet Order            Diet NPO time specified  Diet effective now              EDUCATION NEEDS:   No education needs have been identified at this time  Skin:  Skin Assessment: Reviewed RN Assessment  Last BM:  04/15/19  Height:   Ht  Readings from Last 1 Encounters:  04/15/19 6' (1.829 m)    Weight:   Wt Readings from Last 1 Encounters:  04/16/19 106 kg    Ideal Body Weight:  80.9 kg  BMI:  Body mass index is 31.68 kg/m.  Estimated Nutritional Needs:   Kcal:  2200-2400  Protein:  120-135 grams  Fluid:  > 2 L    Kinsleigh Ludolph A. Jimmye Norman, RD, LDN, [place] Registered Dietitian II Certified Diabetes Care and Education Specialist Pager: [phone] After hours Pager: [phone]

## 2019-04-16 NOTE — Progress Notes (Signed)
TR band is removed at this time. Insertion site is level 0. 

## 2019-04-16 NOTE — H&P (View-Only) (Signed)
Cardiology Consultation:   Patient ID: Timothy Lee; [id number]; 1950/10/25   Admit date: 04/14/2019 Date of Consult: 04/16/2019  Primary Care Provider: Lauree Chandler, NP Primary Cardiologist: New to [facility]- Dr. Fransico Him, MD   Patient Profile:   Timothy Lee is a 68 y.o. male with a hx of hypertension, DM2, untreated OSA and longstanding tobacco use who is being seen today for the evaluation of acute CHF exacerbation and presumed ischemic cardiomyopathy at the request of Dr. Cyndia Skeeters.  History of Present Illness:   Timothy Lee is a 68 year old male with a history stated above who presented to [facility] on 04/15/2019 from an urgent care center with progressive shortness of breath and profound fatigue. Patient reports he was in his usual state of health until last Wednesday morning.  He states he he went to sleep Tuesday night in his downstairs basement and woke up on Wednesday morning with acute dyspnea.  He reports it took him 4 attempts to make it up the 18 stairs to the first floor secondary to exertioanl DOE. He states he began taking his daughters albuterol inhaler which initially gave him relief however over the course of the week, his symptoms would progressively return. Given his symptoms, he had a telehealth visit with his PCP who dx him with COPD exacerbation and prescribed antibiotic therapy. He reports this helped his cough however did not resolve his shortness of breath. Given his persistent symptoms, he presented to an urgent care center where he was found to be tachycardic and hypoxic with an SPO2 of 89% with ambulation. CXR demonstrated mild cardiomegaly with small pleural effusions and interstitial thickening, suspicious for pulmonary edema.  He was then referred to the ED for further evaluation. He was also noted to have recently returned from a trip to [facility] 2 weeks ago. He has no personal or family hx of CAD. Has significant CRF's including HTN, DM2 and tobacco use.      On ED arrival, BNP was found to be elevated at 288.  COVID-19 negative.  CTA performed to rule out PE which was negative however demonstrated bilateral pleural effusions concerning for CHF. CTA also showed coronary calcifications. EKG with sinus tachycardia, Q waves in anteroseptal and nonspecific T wave changes.  He was started on IV Lasix, IV Solu-Medrol and breathing treatments.  He was admitted to hospitalist service in which an echocardiogram was obtained. Unfortunately, this showed a reduced LV function at 35-40% with global hypokinesis.  He denies chest pain or other anginal symptoms. Reports SOB is greatly improved. No recurrence since admission. Denies orthopnea, LE swelling, diaphoresis, N/V, palpitations, pre-syncopal or syncopal episodes.    Past Medical History:  Diagnosis Date   Arthritis    Bladder cancer [facility]) urologist-  dr Kipp Brood   Borderline diabetes    Bursitis of both shoulders    Diabetes mellitus without complication ([place])    Disorder of male genital organs, unspecified    Esophagus disorder    Constricted Esophagus, per New Patient Packet-PSC    History of basal cell carcinoma (BCC) excision    05/ 2018  right forehead   Hypertension    Mild acid reflux no meds   Nocturia    OSA (obstructive sleep apnea)    per pt has not used cpap in years   Productive cough    from smoking   Renal cyst, left 11/2011   16 mm   Sleep apnea    Per New Patient Packet-PSC  Smokers' cough ([place])    SOB (shortness of breath) on exertion     Past Surgical History:  Procedure Laterality Date   CYSTOSCOPY WITH BIOPSY  03/13/2012   Procedure: CYSTOSCOPY WITH BIOPSY;  Surgeon: Fredricka Bonine, MD;  Location: [facility];  Service: Urology;  Laterality: N/A;      ESOPHAGEAL DILATION  05/20/1986   Esophagus Stretch, per New Patient Packet-PSC    TONSILLECTOMY  age 54   TRANSURETHRAL RESECTION OF BLADDER TUMOR N/A 05/23/2017    Procedure: TRANSURETHRAL RESECTION OF BLADDER TUMOR / (TURBT) WITH INSTILLATION OF EPIRUBICIN;  Surgeon: [name], MD;  Location: [facility];  Service: Urology;  Laterality: N/A;   UVULECTOMY  05/20/1990   Dr.Byers, per new patient packet    UVULOPALATOPHARYNGOPLASTY  2005   osa     Prior to Admission medications   Medication Sig Start Date End Date Taking? Authorizing Provider  acetaminophen (TYLENOL) 325 MG tablet Take 650 mg by mouth every 6 (six) hours as needed for moderate pain.    Yes [provider]  albuterol (VENTOLIN HFA) 108 (90 Base) MCG/ACT inhaler Inhale 2 puffs into the lungs every 6 (six) hours as needed for wheezing or shortness of breath. 04/09/19  Yes Ngetich, Dinah C, NP  amLODipine-benazepril (LOTREL) 5-20 MG capsule Take 1 capsule by mouth every morning. 01/12/19  Yes Lauree Chandler, NP  Baclofen 5 MG TABS Take 5 mg by mouth daily.    Yes [provider]  ibuprofen (ADVIL,MOTRIN) 200 MG tablet Take 200 mg by mouth every 6 (six) hours as needed for mild pain.    Yes [provider]  metFORMIN (GLUCOPHAGE) 1000 MG tablet Take 1 tablet (1,000 mg total) by mouth 2 (two) times daily with a meal. 09/28/18  Yes Lauree Chandler, NP    Inpatient Medications: Scheduled Meds:  amLODipine  5 mg Oral Daily   arformoterol  15 mcg Nebulization BID   atorvastatin  20 mg Oral q1800   baclofen  5 mg Oral BID   benzonatate  100 mg Oral TID   budesonide (PULMICORT) nebulizer solution  0.25 mg Nebulization BID   doxycycline  100 mg Oral Q12H   enoxaparin (LOVENOX) injection  40 mg Subcutaneous Q24H   furosemide  20 mg Intravenous Q12H   guaiFENesin  600 mg Oral BID   insulin aspart  0-15 Units Subcutaneous TID WC   insulin aspart  0-5 Units Subcutaneous QHS   insulin aspart  5 Units Subcutaneous TID WC   insulin glargine  5 Units Subcutaneous BID   mouth rinse  15 mL Mouth Rinse BID   methylPREDNISolone  (SOLU-MEDROL) injection  40 mg Intravenous Q8H   metoprolol tartrate  25 mg Oral BID   nicotine  21 mg Transdermal Daily   sodium chloride flush  3 mL Intravenous Q12H   Continuous Infusions:  PRN Meds: acetaminophen, diazepam, ipratropium-albuterol, ondansetron **OR** ondansetron (ZOFRAN) IV  Allergies:    Allergies  Allergen Reactions   Chantix [Varenicline Tartrate]     Aggression     Social History:   Social History   Socioeconomic History   Marital status: Married    Spouse name: Not on file   Number of children: Not on file   Years of education: Not on file   Highest education level: Not on file  Occupational History   Not on file  Social Needs   Financial resource strain: Not on file   Food insecurity  Worry: Not on file    Inability: Not on file   Transportation needs    Medical: Not on file    Non-medical: Not on file  Tobacco Use   Smoking status: Current Every Day Smoker    Packs/day: 0.10    Years: 47.00    Pack years: 4.70    Types: Cigarettes   Smokeless tobacco: Never Used   Tobacco comment: cutting back  Substance and Sexual Activity   Alcohol use: Yes    Comment: very seldom- rare   Drug use: Yes    Types: Marijuana    Comment: seldom-rare   Sexual activity: Not Currently  Lifestyle   Physical activity    Days per week: Not on file    Minutes per session: Not on file   Stress: Not on file  Relationships   Social connections    Talks on phone: Not on file    Gets together: Not on file    Attends religious service: Not on file    Active member of club or organization: Not on file    Attends meetings of clubs or organizations: Not on file    Relationship status: Not on file   Intimate partner violence    Fear of current or ex partner: Not on file    Emotionally abused: Not on file    Physically abused: Not on file    Forced sexual activity: Not on file  Other Topics Concern   Not on file  Social History  Narrative   Diet: None      Caffeine: Yes      Married, if yes what year:  Yes, 1990      Do you live in a house, apartment, assisted living, condo, trailer, ect: House, 3 stories, 4 persons      Pets: 2 dogs, 2 cats       Highest level of education: Some Facilities manager profession: Clinical cytogeneticist       Exercise: Some         Living Will: Yes   DNR: Yes   POA/HPOA: Yes      Functional Status:   Do you have difficulty bathing or dressing yourself? No   Do you have difficulty preparing food or eating? No   Do you have difficulty managing your medications? No   Do you have difficulty managing your finances? No   Do you have difficulty affording your medications? No    Family History:   Family History  Problem Relation Age of Onset   Diabetes Mother    Dementia Mother    Stroke Mother    Arthritis Mother    Hypertension Mother    Multiple sclerosis Father    Diabetes Father    Diabetes Sister    Hypertension Sister    ADD / ADHD Son    Migraines Daughter    ADD / ADHD Daughter    Cancer Maternal Grandmother    Family Status:  Family Status  Relation Name Status   Mother Nancie Neas Alive   Father Arden Deceased at age 86s       MS   Sister [place]   Daughter Clarise Cruz Alive   MGM  (Not Specified)    ROS:  Please see the history of present illness.  All other ROS reviewed and negative.     Physical Exam/Data:   Vitals:   04/15/19 2158 04/16/19 0015 04/16/19 0449 04/16/19 [id number]  BP: 125/82 (!) 123/91 95/76   Pulse: 99 89 95   Resp:   20   Temp:   97.6 F (36.4 C)   TempSrc:   Oral   SpO2: 94% 98% 95% 92%  Weight:   106 kg   Height:        Intake/Output Summary (Last 24 hours) at 04/16/2019 0758 Last data filed at 04/16/2019 [id number] Gross per 24 hour  Intake 776 ml  Output 2650 ml  Net -1874 ml   Filed Weights   04/14/19 1727 04/15/19 1127 04/16/19 0449  Weight: 110.2 kg 107.2 kg 106 kg   Body mass index  is 31.68 kg/m.   General: Well developed, NAD Neck: No JVD Lungs. No wheezes, BL rhonchi. Breathing is unlabored. Cardiovascular: RRR with S1 S2. No murmurs Abdomen: Soft, non-tender, non-distended. No obvious abdominal masses. MSK: Strength and tone appear normal for age. 5/5 in all extremities Extremities: No edema. No clubbing or cyanosis. DP pulses 2+ bilaterally Neuro: Alert and oriented. No focal deficits. No facial asymmetry. MAE spontaneously. Psych: Responds to questions appropriately with normal affect.     EKG:  The EKG was personally reviewed and demonstrates: 04/14/2019 ST with HR 120bpm, Q waves in anteroseptal leads with non-specific T wave abnormalities. No acute ST segment changes.  Telemetry:  Telemetry was personally reviewed and demonstrates:  NSR/ST HR 90-100's   Relevant CV Studies:  Echocardiogram 04/15/2019:   1. Left ventricular ejection fraction, by visual estimation, is 35 to 40%. The left ventricle has severely decreased function. There is no left ventricular hypertrophy. Global hypokinesis. Though not optimally visualized, there appears to be relatively  worse function of the lateral LV wall. Mildly dilated left ventricular internal cavity size.  2. Left ventricular diastolic parameters are indeterminate.  3. Global right ventricle has moderately reduced systolic function.The right ventricular size is normal. No increase in right ventricular wall thickness.  4. Left atrial size was normal.  5. Right atrial size was normal.  6. The mitral valve is normal in structure. Mild mitral valve regurgitation. No evidence of mitral stenosis.  7. The tricuspid valve is normal in structure. Tricuspid valve regurgitation is trivial.  8. The aortic valve is normal in structure. Aortic valve regurgitation is not visualized. Mild aortic valve sclerosis without stenosis.  9. The pulmonic valve was not well visualized. Pulmonic valve regurgitation is trivial. 10. The inferior  vena cava is dilated in size with >50% respiratory variability, suggesting right atrial pressure of 8 mmHg. 11. Trivial pericardial effusion is present.  Laboratory Data:  Chemistry Recent Labs  Lab 04/14/19 1729 04/15/19 0432 04/16/19 0420  NA 138 138 138  K 4.1 3.8 4.3  CL 103 100 99  CO2 25 26 28   GLUCOSE 139* 200* 207*  BUN 10 9 12   CREATININE 0.99 0.84 0.84  CALCIUM 9.2 9.4 9.6  GFRNONAA >60 >60 >60  GFRAA >60 >60 >60  ANIONGAP 10 12 11     Total Protein  Date Value Ref Range Status  01/04/2019 6.2 6.1 - 8.1 g/dL Final   AST  Date Value Ref Range Status  01/04/2019 13 10 - 35 U/L Final   ALT  Date Value Ref Range Status  01/04/2019 22 9 - 46 U/L Final   Alkaline Phosphatase  Date Value Ref Range Status  05/27/2013 61 25 - 125 Final   Total Bilirubin  Date Value Ref Range Status  01/04/2019 1.5 (H) 0.2 - 1.2 mg/dL Final   Hematology Recent Labs  Lab  04/14/19 1729 04/16/19 0420  WBC 10.4 18.1*  RBC 5.19 4.98  HGB 15.5 14.9  HCT 46.8 43.9  MCV 90.2 88.2  MCH 29.9 29.9  MCHC 33.1 33.9  RDW 11.9 12.0  PLT 366 345   Cardiac EnzymesNo results for input(s): TROPONINI in the last 168 hours. No results for input(s): TROPIPOC in the last 168 hours.  BNP Recent Labs  Lab 04/14/19 1645  BNP 288.0*    DDimer No results for input(s): DDIMER in the last 168 hours. TSH:  Lab Results  Component Value Date   TSH 0.303 (L) 04/15/2019   Lipids: Lab Results  Component Value Date   CHOL 156 04/16/2019   HDL 45 04/16/2019   LDLCALC 101 (H) 04/16/2019   TRIG 48 04/16/2019   CHOLHDL 3.5 04/16/2019   HgbA1c: Lab Results  Component Value Date   HGBA1C 7.4 (H) 04/15/2019    Radiology/Studies:  Dg Chest 2 View  Result Date: 04/14/2019 CLINICAL DATA:  Cough and shortness of breath despite taking azithromycin. Smoker. EXAM: CHEST - 2 VIEW COMPARISON:  Radiograph 09/18/2017 FINDINGS: Mild cardiomegaly. Unchanged aortic tortuosity compared to prior exam.  Interstitial thickening most consistent with pulmonary edema. Small bilateral pleural effusions. No confluent airspace disease. No pneumothorax. No acute osseous abnormalities are seen. Mild wedging of lower thoracic vertebra is unchanged. IMPRESSION: Mild cardiomegaly with small pleural effusions. Interstitial thickening suspicious for pulmonary edema. Findings are most consistent with CHF. Electronically Signed   By: Keith Rake [name]   On: 04/14/2019 15:38   Ct Angio Chest Pe W And/or Wo Contrast  Result Date: 04/14/2019 CLINICAL DATA:  Chest pain shortness of breath for 6 days, cough EXAM: CT ANGIOGRAPHY CHEST WITH CONTRAST TECHNIQUE: Multidetector CT imaging of the chest was performed using the standard protocol during bolus administration of intravenous contrast. Multiplanar CT image reconstructions and MIPs were obtained to evaluate the vascular anatomy. CONTRAST:  149mL OMNIPAQUE IOHEXOL 350 MG/ML SOLN COMPARISON:  Chest radiograph 04/04/2019 FINDINGS: Cardiovascular: Satisfactory opacification the pulmonary arteries to the segmental level. No pulmonary artery filling defects are identified. Central pulmonary arteries are normal caliber. No elevation of the RV/LV ratio (0.73). Mild cardiomegaly. Trace pericardial fluid. Coronary artery calcifications are noted. Thoracic aortic caliber is at the upper limits of normal. Minimal atherosclerotic plaque in the aorta. The left vertebral artery arises directly from the aortic arch. Minimal atherosclerotic plaque in the great vessels. Mediastinum/Nodes: Low-attenuation mediastinal and hilar nodes are present. Several larger low-attenuation nodes include a 12 mm subcarinal lymph node (5/73), a 11 mm left carinal lymph node (5/61) and a 12 mm right carinal lymph node (5/70). There is a heterogeneously enlarged left lobe thyroid gland. Discrete nodularity difficult to ascertain. Bowing of the trachea compatible with imaging during exhalation. Mild diffuse  peribronchovascular thickening is noted. No acute abnormality of the esophagus. Lungs/Pleura: Moderate bilateral effusions with adjacent passive atelectasis in the airless lung. Additional fluid seen tracking into the fissures with adjacent passive atelectasis of the upper lobes as well. Interlobular septal thickening is noted in the lung bases and apices with some central peribronchovascular opacity. Interspersed areas of ground-glass opacity are noted. No pneumothorax. Upper Abdomen: Mild bilateral symmetric perinephric stranding, a nonspecific finding though may correlate with either age or decreased renal function. No acute abnormalities present in the visualized portions of the upper abdomen. Musculoskeletal: T11 anterior wedging is similar to comparison from 2018. Multilevel degenerative changes are present in the imaged portions of the spine. No acute osseous abnormality or suspicious osseous  lesion. No suspicious chest wall lesions. Review of the MIP images confirms the above findings. IMPRESSION: 1. No evidence of pulmonary embolism. 2. Moderate bilateral pleural effusions with adjacent passive atelectasis in the airless lung. 3. Interlobular septal thickening in the lung bases and apices with some central peribronchovascular and ground-glass opacity, most suggestive of pulmonary edema. 4. Low-attenuation mediastinal and hilar nodes, likely reactive/edematous. 5. Heterogeneous, enlarged left lobe thyroid gland. Discrete nodularity difficult to ascertain. Consider further evaluation with thyroid ultrasound. This follows consensus guidelines: Managing Incidental Thyroid Nodules Detected on Imaging: White Paper of the ACR Incidental Thyroid Findings Committee. J Am Coll Radiol 2015; 12:143-150. and [facility] 3-tiered system for managing ITNs: J Am Coll Radiol. 2015; Feb;12(2): 143-50 6. Aortic Atherosclerosis (ICD10-I70.0). 7. Cardiomegaly.  Coronary atherosclerosis. Electronically Signed   By: Lovena Le [name]    On: 04/14/2019 22:54    Assessment and Plan:   1. Acute CHF exacerbation: -Patient presented to an outpatient urgent care after a week ling, acute onset of respiratory symptoms including SOB with rales, rhonchi and SPO2 less than 90%. CXR concerning for CHF therefore was referred to the ED for further evaluation. -CTA on arrival to rule out PE was negative however again with concern for acute CHF>>noted to have coronary calcifications on imagining  -Given IV Lasix with good diuresis>>improved symptoms  -ContinueIV Lasix 20 mg twice daily? -I&O, net negative 1.8L since admission  -Weight, 233lb today>>>244lb on presentation -Echocardiogram performed 04/15/2019 with LVEF at 35 to 40% with global hypokinesis and left ventricular regional wall motion abnormalities -Continue with IV Lasix 20mg  BID given good response -Will start low dose losartan given reduced LV function -Decrease metoprolol to 12.5mg  BID for now to allow for medication titration  -Creatinine stable at 0.84  2. Coronary atherosclerosis with reduced LV function: -No prior history of CAD however with coronary atherosclerosis on CTA and CRF's including HTN. DM2 and (55 year) tobacco use history -Echocardiogram with reduced LV function at 35-40% and global  hypokinesis -Denies anginal symptoms -Would benefit from further work-up with either stress testing or LHC.  -Will start ASA therapy -Reduce metoprolol to 12.5mg  twice daily>> could consider transition to bisoprolol given pulmonary history -Will add low dose losartan and titrate as tolerated. May benefit from [facility] once cardiomyopathy more clearly defined.   -Continue statin>> may need to increase to high intensity based on further cardiac work-up  3. COPD exacerbation: -Presented with worsening shortness of breath and productive cough with long, 55-pack-year history of tobacco abuse -Started on IV Solu-Medrol, inhalers with antibiotics per primary team -COVID-19  negative -Further management per primary team  4. Uncontrolled DM2: -Hemoglobin A1c, 7.4 -Diagnose in 05/2018 -SSI for glucose control while inpatient status -Per primary team  5. Tobacco use: -55-year pack tobacco history -Smoking cessation strongly encouraged given high risk for CAD  6. HLD: -LDL, 101 from 04/16/2019 -Statin initiated per primary team  7. HTN: -Stable, 131/96>123/91>125/82>122/83 -Reduce metoprolol to 12.5 BID, add losartan 25mg  -Continue amlodipine 5 for now and monitor BP closely   For questions or updates, please contact [place] Please consult [email or web address] for contact info under Cardiology/STEMI.   SignedKathyrn Drown NP-C [facility] Pager: [phone] 04/16/2019 7:58 AM

## 2019-04-16 NOTE — Interval H&P Note (Signed)
Cath Lab Visit (complete for each Cath Lab visit)  Clinical Evaluation Leading to the Procedure:   ACS: No.  Non-ACS:    Anginal Classification: CCS III  Anti-ischemic medical therapy: Maximal Therapy (2 or more classes of medications)  Non-Invasive Test Results: No non-invasive testing performed  Prior CABG: No previous CABG      History and Physical Interval Note:  04/16/2019 2:58 PM  Timothy Lee  has presented today for surgery, with the diagnosis of shortness of breath.  The various methods of treatment have been discussed with the patient and family. After consideration of risks, benefits and other options for treatment, the patient has consented to  Procedure(s): RIGHT/LEFT HEART CATH AND CORONARY ANGIOGRAPHY (N/A) as a surgical intervention.  The patient's history has been reviewed, patient examined, no change in status, stable for surgery.  I have reviewed the patient's chart and labs.  Questions were answered to the patient's satisfaction.     Shelva Majestic

## 2019-04-16 NOTE — Progress Notes (Addendum)
Page sent to J.McDaniel,NP to verify treatment plan for today; as pt is concerned about remaining NPO with nothing scheduled.   Dr Radford Pax arrives to evaluate the patient, to determine need for cath today. Awaiting specific orders/plan. Primary RN notified.

## 2019-04-17 DIAGNOSIS — I25119 Atherosclerotic heart disease of native coronary artery with unspecified angina pectoris: Secondary | ICD-10-CM

## 2019-04-17 DIAGNOSIS — I272 Pulmonary hypertension, unspecified: Secondary | ICD-10-CM

## 2019-04-17 DIAGNOSIS — I2583 Coronary atherosclerosis due to lipid rich plaque: Secondary | ICD-10-CM

## 2019-04-17 DIAGNOSIS — G4733 Obstructive sleep apnea (adult) (pediatric): Secondary | ICD-10-CM

## 2019-04-17 DIAGNOSIS — E7800 Pure hypercholesterolemia, unspecified: Secondary | ICD-10-CM

## 2019-04-17 DIAGNOSIS — I10 Essential (primary) hypertension: Secondary | ICD-10-CM

## 2019-04-17 DIAGNOSIS — I5021 Acute systolic (congestive) heart failure: Secondary | ICD-10-CM

## 2019-04-17 LAB — GLUCOSE, CAPILLARY
Glucose-Capillary: 251 mg/dL — ABNORMAL HIGH (ref 70–99)
Glucose-Capillary: 292 mg/dL — ABNORMAL HIGH (ref 70–99)
Glucose-Capillary: 279 mg/dL — ABNORMAL HIGH (ref 70–99)
Glucose-Capillary: 389 mg/dL — ABNORMAL HIGH (ref 70–99)
Glucose-Capillary: 186 mg/dL — ABNORMAL HIGH (ref 70–99)

## 2019-04-17 LAB — BASIC METABOLIC PANEL
Anion gap: 12 (ref 5–15)
BUN: 21 mg/dL (ref 8–23)
CO2: 27 mmol/L (ref 22–32)
Calcium: 9.2 mg/dL (ref 8.9–10.3)
Chloride: 99 mmol/L (ref 98–111)
Creatinine, Ser: 1.07 mg/dL (ref 0.61–1.24)
GFR calc Af Amer: >60 mL/min (ref >60)
GFR calc non Af Amer: >60 mL/min (ref >60)
Glucose, Bld: 256 mg/dL — ABNORMAL HIGH (ref 70–99)
Potassium: 4 mmol/L (ref 3.5–5.1)
Sodium: 138 mmol/L (ref 135–145)

## 2019-04-17 LAB — CBC
HCT: 43.8 % (ref 39.0–52.0)
Hemoglobin: 14.5 g/dL (ref 13.0–17.0)
MCH: 29.8 pg (ref 26.0–34.0)
MCHC: 33.1 g/dL (ref 30.0–36.0)
MCV: 90.1 fL (ref 80.0–100.0)
Platelets: 352 10*3/uL (ref 150–400)
RBC: 4.86 MIL/uL (ref 4.22–5.81)
RDW: 12.2 % (ref 11.5–15.5)
WBC: 19.5 10*3/uL — ABNORMAL HIGH (ref 4.0–10.5)
nRBC: 0 % (ref 0.0–0.2)

## 2019-04-17 LAB — MAGNESIUM: Magnesium: 2.2 mg/dL (ref 1.7–2.4)

## 2019-04-17 MED ORDER — INSULIN GLARGINE 100 UNIT/ML ~~LOC~~ SOLN
20.0000 [IU] | Freq: Two times a day (BID) | SUBCUTANEOUS | Status: DC
  Administered 2019-04-18: 20 [IU] via SUBCUTANEOUS
  Filled 2019-04-17 (×4): qty 0.2

## 2019-04-17 MED ORDER — INSULIN GLARGINE 100 UNIT/ML ~~LOC~~ SOLN: 15.0000 [IU] | Freq: Two times a day (BID) | SUBCUTANEOUS | Status: DC

## 2019-04-17 MED ORDER — PREDNISONE 50 MG PO TABS
50.0000 mg | ORAL_TABLET | Freq: Every day | ORAL | Status: DC
  Administered 2019-04-17 – 2019-04-18 (×2): 50 mg via ORAL
  Filled 2019-04-17 (×2): qty 1

## 2019-04-17 MED ORDER — METOPROLOL SUCCINATE ER 25 MG PO TB24
12.5000 mg | ORAL_TABLET | Freq: Every day | ORAL | Status: DC
  Administered 2019-04-17: 12.5 mg via ORAL
  Filled 2019-04-17: qty 1

## 2019-04-17 MED ORDER — CARVEDILOL 3.125 MG PO TABS: 3.1250 mg | ORAL_TABLET | Freq: Two times a day (BID) | ORAL | Status: DC

## 2019-04-17 MED ORDER — INSULIN GLARGINE 100 UNIT/ML ~~LOC~~ SOLN
10.0000 [IU] | Freq: Two times a day (BID) | SUBCUTANEOUS | Status: DC
  Administered 2019-04-17 (×2): 10 [IU] via SUBCUTANEOUS
  Filled 2019-04-17 (×2): qty 0.1

## 2019-04-17 MED ORDER — LOSARTAN POTASSIUM 25 MG PO TABS
25.0000 mg | ORAL_TABLET | Freq: Every day | ORAL | Status: DC
  Administered 2019-04-17: 25 mg via ORAL
  Filled 2019-04-17: qty 1

## 2019-04-17 NOTE — Progress Notes (Signed)
Progress Note  Patient Name: Timothy Lee Date of Encounter: 04/17/2019  Primary Cardiologist: No primary care provider on file.   Subjective   Cath done yesterday showing single vessel borderline obstructive CAD. Mild to moderate pulmonary HTN noted with PAP 51/46mmHg and LVEDP 25-110mmHg.  Medical management recommended for CAD and NICM.  SOB improved.  Denies CP.    Inpatient Medications    Scheduled Meds:  amLODipine  5 mg Oral Daily   arformoterol  15 mcg Nebulization BID   aspirin  81 mg Oral Daily   atorvastatin  80 mg Oral q1800   baclofen  5 mg Oral BID   benzonatate  100 mg Oral TID   budesonide (PULMICORT) nebulizer solution  0.25 mg Nebulization BID   doxycycline  100 mg Oral Q12H   enoxaparin (LOVENOX) injection  40 mg Subcutaneous Q24H   feeding supplement (ENSURE ENLIVE)  237 mL Oral BID BM   furosemide  40 mg Intravenous Q12H   guaiFENesin  600 mg Oral BID   insulin aspart  0-15 Units Subcutaneous TID WC   insulin aspart  0-5 Units Subcutaneous QHS   insulin aspart  5 Units Subcutaneous TID WC   insulin glargine  10 Units Subcutaneous BID   mouth rinse  15 mL Mouth Rinse BID   metoprolol tartrate  12.5 mg Oral BID   multivitamin with minerals  1 tablet Oral Daily   nicotine  21 mg Transdermal Daily   predniSONE  50 mg Oral Q breakfast   sodium chloride flush  3 mL Intravenous Q12H   spironolactone  12.5 mg Oral Daily   Continuous Infusions:  sodium chloride     PRN Meds: sodium chloride, acetaminophen, acetaminophen, diazepam, ipratropium-albuterol, ondansetron (ZOFRAN) IV, ondansetron **OR** [DISCONTINUED] ondansetron (ZOFRAN) IV, sodium chloride flush   Vital Signs    Vitals:   04/17/19 0824 04/17/19 0825 04/17/19 0900 04/17/19 0905  BP:   113/70   Pulse:   (!) 108   Resp:   (!) 22 20  Temp:   98.4 F (36.9 C)   TempSrc:   Oral   SpO2: 95% 96% 93%   Weight:      Height:        Intake/Output Summary (Last 24  hours) at 04/17/2019 1044 Last data filed at 04/17/2019 0900 Gross per 24 hour  Intake 960 ml  Output --  Net 960 ml   Filed Weights   04/15/19 1127 04/16/19 0449 04/17/19 0053  Weight: 107.2 kg 106 kg 107.3 kg    Telemetry    NSR - Personally Reviewed  ECG    No new EKG to review - Personally Reviewed  Physical Exam   GEN: No acute distress.   Neck: No JVD Cardiac: RRR, no murmurs, rubs, or gallops.  Respiratory: Clear to auscultation bilaterally. GI: Soft, nontender, non-distended  MS: No edema; No deformity. Neuro:  Nonfocal  Psych: Normal affect   Labs    Chemistry Recent Labs  Lab 04/15/19 0432 04/16/19 0420 04/16/19 1523 04/16/19 1535 04/17/19 0451  NA 138 138 138   138 123* 138  K 3.8 4.3 4.0   4.0 3.6 4.0  CL 100 99  --   --  99  CO2 26 28  --   --  27  GLUCOSE 200* 207*  --   --  256*  BUN 9 12  --   --  21  CREATININE 0.84 0.84  --   --  1.07  CALCIUM 9.4  9.6  --   --  9.2  GFRNONAA >60 >60  --   --  >60  GFRAA >60 >60  --   --  >60  ANIONGAP 12 11  --   --  12     Hematology Recent Labs  Lab 04/14/19 1729 04/16/19 0420 04/16/19 1523 04/16/19 1535 04/17/19 0451  WBC 10.4 18.1*  --   --  19.5*  RBC 5.19 4.98  --   --  4.86  HGB 15.5 14.9 15.0   15.0 14.6 14.5  HCT 46.8 43.9 44.0   44.0 43.0 43.8  MCV 90.2 88.2  --   --  90.1  MCH 29.9 29.9  --   --  29.8  MCHC 33.1 33.9  --   --  33.1  RDW 11.9 12.0  --   --  12.2  PLT 366 345  --   --  352    Cardiac EnzymesNo results for input(s): TROPONINI in the last 168 hours. No results for input(s): TROPIPOC in the last 168 hours.   BNP Recent Labs  Lab 04/14/19 1645  BNP 288.0*     DDimer No results for input(s): DDIMER in the last 168 hours.   Radiology    US Thyroid  Result Date: 04/16/2019 CLINICAL DATA:  Incidental on CT. Enlargement of left lobe of the thyroid gland by CT. EXAM: THYROID ULTRASOUND TECHNIQUE: Ultrasound examination of the thyroid gland and adjacent soft  tissues was performed. COMPARISON:  CTA of the chest on 04/14/2019 FINDINGS: Parenchymal Echotexture: Mildly heterogenous Isthmus: 0.9 cm Right lobe: 3.8 x 2.0 x 1.4 cm Left lobe: 3.1 x 1.9 x 1.7 cm (mediastinal extension of left thyroid goiter likely not fully visualized by ultrasound) _________________________________________________________ Estimated total number of nodules >/= 1 cm: 1 Number of spongiform nodules >/=  2 cm not described below (TR1): 0 Number of mixed cystic and solid nodules >/= 1.5 cm not described below ([place]): 0 _________________________________________________________ Nodule # 1: Location: Right; Mid Maximum size: 0.7 cm; Other 2 dimensions: 0.7 x 0.7 cm Composition: spongiform (0) Echogenicity: anechoic (0) Shape: not taller-than-wide (0) Margins: smooth (0) Echogenic foci: none (0) ACR TI-RADS total points: 0. ACR TI-RADS risk category: TR1 (0-1 points). ACR TI-RADS recommendations: This nodule does NOT meet TI-RADS criteria for biopsy or dedicated follow-up. _________________________________________________________ Nodule # 2: Location: Left; Superior Maximum size: 1.1 cm; Other 2 dimensions: 0.8 x 0.9 cm Composition: solid/almost completely solid (2) Echogenicity: hypoechoic (2) Shape: not taller-than-wide (0) Margins: smooth (0) Echogenic foci: none (0) ACR TI-RADS total points: 4. ACR TI-RADS risk category: TR4 (4-6 points). ACR TI-RADS recommendations: *Given size (>/= 1 - 1.4 cm) and appearance, a follow-up ultrasound in 1 year should be considered based on TI-RADS criteria. _________________________________________________________ Nodule # 3: Location: Left; Inferior Maximum size: 0.6 cm; Other 2 dimensions: 0.6 x 0.6 cm Composition: mixed cystic and solid (1) Echogenicity: isoechoic (1) Shape: not taller-than-wide (0) Margins: ill-defined (0) Echogenic foci: none (0) ACR TI-RADS total points: 2. ACR TI-RADS risk category: TR2 (2 points). ACR TI-RADS recommendations: This nodule does  NOT meet TI-RADS criteria for biopsy or dedicated follow-up. _________________________________________________________ No abnormal lymph nodes identified. IMPRESSION: 1.1 cm left superior thyroid nodule meets criteria for 1 year follow-up ultrasound. 0.7 cm right mid and 0.6 cm left inferior nodules do not meet criteria for biopsy or further follow-up. The above is in keeping with the ACR TI-RADS recommendations - J Am Coll Radiol 2017;14:587-595. Electronically Signed   By: Aletta Edouard  [name]   On: 04/16/2019 08:28    Cardiac Studies   Cardiac Cath 04/16/2019 Conclusion    Prox LAD lesion is 55% stenosed.   Single-vessel coronary obstructive disease with smooth 50 to 60% proximal eccentric LAD stenosis; normal small ramus intermediate, left circumflex, and dominant RCA.  Mild to moderate right heart pressure elevation with moderate pulmonary hypertension with PA pressure 51/23, mean pressure 39.  LVEDP 25 - 31 mm Hg.  RECOMMENDATION: Guideline directed medical therapy for nonischemic cardiomyopathy.  Smoking cessation is essential.  Aggressive lipid-lowering therapy in attempt to induce plaque regression with target LDL less than 70 and preferably 50s or below.  Transition ACE-I to [facility] consider aldosterone blockade with spironolactone.    2D echo 04/15/2019 IMPRESSIONS    1. Left ventricular ejection fraction, by visual estimation, is 35 to 40%. The left ventricle has severely decreased function. There is no left ventricular hypertrophy. Global hypokinesis. Though not optimally visualized, there appears to be relatively  worse function of the lateral LV wall. Mildly dilated left ventricular internal cavity size.  2. Left ventricular diastolic parameters are indeterminate.  3. Global right ventricle has moderately reduced systolic function.The right ventricular size is normal. No increase in right ventricular wall thickness.  4. Left atrial size was normal.  5. Right  atrial size was normal.  6. The mitral valve is normal in structure. Mild mitral valve regurgitation. No evidence of mitral stenosis.  7. The tricuspid valve is normal in structure. Tricuspid valve regurgitation is trivial.  8. The aortic valve is normal in structure. Aortic valve regurgitation is not visualized. Mild aortic valve sclerosis without stenosis.  9. The pulmonic valve was not well visualized. Pulmonic valve regurgitation is trivial. 10. The inferior vena cava is dilated in size with >50% respiratory variability, suggesting right atrial pressure of 8 mmHg. 11. Trivial pericardial effusion is present.   Patient Profile     68 y.o. male with a hx of hypertension, DM2, untreated OSA and longstanding tobacco use who is being seen today for the evaluation of acute CHF exacerbation and presumed ischemic cardiomyopathy at the request of Dr. Cyndia Skeeters  Assessment & Plan    1. Acute CHF exacerbation: -Patient presented to an outpatient urgent care after a week ling, acute onset of respiratory symptoms including SOB with rales, rhonchi and SPO2 less than 90%. CXR concerning for CHF therefore was referred to the ED for further evaluation. -CTA on arrival to rule out PE was negative however again with concern for acute CHF>>noted to have coronary calcifications on imagining  -Echocardiogram performed 04/15/2019 with LVEF at 35 to 40% with global hypokinesis and left ventricular regional wall motion abnormalities -started on IV Lasix -I&Os not accurate -weight decreased 7lbs from admit but up 3lbs from yesterday -continue with IV lasix 40mg  BID -strict I&Os, daily weights -stop amlodipine to allow adequate BP to titrate HF meds -start losartan 25mg  daily with plans to transition to Entresto at discharge -change Lopressor tartrate to succinate for better B1 selectivity in setting of COPD -continue spiro 12.5mg  daily  2. Coronary atherosclerosis with reduced LV function: -No prior history of  CAD however with coronary atherosclerosis on CTA and CRF's including HTN. DM2 and (51 year) tobacco use history  -Echocardiogram with reduced LV function at 35-40% and global  hypokinesis -cath yesterday with 20% prox and 60% mid LAD stenosis - med management -continue ASA 81mg  daily and change Lopressor tartrate to succinate due to more B1 selectivity in setting of COPD -Atorvastatin increased  to 80mg  daily -will need FLP and ALT in 6 weeks  3. COPD exacerbation: -Presented with worsening shortness of breath and productive cough with long, 55-pack-year history of tobacco abuse -Started on IV Solu-Medrol, inhalers with antibiotics per primary team -COVID-19 negative -Further management per primary team  4. Uncontrolled DM2: -Hemoglobin A1c, 7.4 -Diagnose in 05/2018 -SSI for glucose control while inpatient status -Per primary team  5. Tobacco use: -55-year pack tobacco history -Smoking cessation strongly encouraged given high risk for CAD  6. HLD: -LDL, 101 from 04/16/2019 -Statin initiated per primary team and increased to 80mg  daily  7. HTN: -Stable, 131/96>123/91>125/82>122/83>126/67mmHg -starting Losartan for CHF -stop amlodipine to allow for adequate BP to titrate HF meds -change Lopressor tartrate to succinate 12.5mg  daily due to COPD  I have spent a total of 35 minutes with patient reviewing 2D echo, cardiac cath , telemetry, EKGs, labs and examining patient as well as establishing an assessment and plan that was discussed with the patient.  > 50% of time was spent in direct patient care.        For questions or updates, please contact [place] Please consult [email or web address] for contact info under Cardiology/STEMI.      Signed, Fransico Him, MD  04/17/2019, 10:44 AM

## 2019-04-17 NOTE — Progress Notes (Signed)
PROGRESS NOTE  Timothy Lee [id number] DOB: Apr 25, 1951   PCP: Lauree Chandler, NP  Patient is from: Home  DOA: 04/14/2019 LOS: 2  Brief Narrative / Interim history: 68 year old male with history of DM-2, HTN, untreated OSA, 55-pack-year history and current smoker presenting with worsening shortness of breath and productive cough.  Recently returned from [facility] 2 weeks ago.  Treated outpatient with Z-Pak, Tessalon Perles and home albuterol without significant improvement in his breathing.  Called PCP who advised him to go to urgent care.  At UC, had rales, strong cough, tachycardia, DOE, desaturation to 89% on RA and CXR concerning for CHF.  He was referred to ED.   In ED, tachycardic, tachypneic and hypertensive.  Saturating at 95% on room air.  BMP unremarkable except for mildly elevated glucose.  BNP 288.  CBC unremarkable.  COVID-19 negative.  CTA chest negative for PE but concerning for CHF, mediastinal lymphadenopathy and heterogeneous left thyroid lobe nodule.  EKG with sinus tachycardia, LAE and nonspecific T wave changes.  Started on IV Lasix, IV Solu-Medrol and breathing treatments and admitted for acute hypoxic respiratory failure due to new CHF and COPD exacerbation.  Patient has no formal diagnosis of COPD but has been on as needed albuterol.  Echocardiogram on 04/15/2019 with LVEF of 35 to 40%,  regional wall motion abnormalities, global hypokinesis and moderately reduced RVEF.  Cardiology consulted.  He had heart catheterization on 04/16/2019.revealed single vessel borderline obstructive CAD, mild to moderate pulmonary HTN and LVEDP to 25-31 mmHg.  Medical management was recommended for CAD and NICM  Subjective: No major events overnight of this morning.  Reports improvement in his breathing.  Was coughing this morning.  He says he choked on something when he tried to swallow.  Denies chest pain, palpitation or dizziness.  He says he would like to go home.   Objective:  Vitals:   04/17/19 0824 04/17/19 0825 04/17/19 0900 04/17/19 0905  BP:   113/70   Pulse:   (!) 108   Resp:   (!) 22 20  Temp:   98.4 F (36.9 C)   TempSrc:   Oral   SpO2: 95% 96% 93%   Weight:      Height:        Intake/Output Summary (Last 24 hours) at 04/17/2019 1123 Last data filed at 04/17/2019 0900 Gross per 24 hour  Intake 960 ml  Output -  Net 960 ml   Filed Weights   04/15/19 1127 04/16/19 0449 04/17/19 0053  Weight: 107.2 kg 106 kg 107.3 kg    Examination:  GENERAL: No acute distress.  Appears well.  HEENT: MMM.  Vision and hearing grossly intact.  NECK: Supple.  No apparent JVD.  RESP:  No IWOB. Good air movement bilaterally. CVS:  RRR. Heart sounds normal.  ABD/GI/GU: Bowel sounds present. Soft. Non tender.  MSK/EXT:  Moves extremities. No apparent deformity or edema.  SKIN: no apparent skin lesion or wound NEURO: Awake, alert and oriented appropriately.  No apparent focal neuro deficit. PSYCH: Calm. Normal affect.  Echocardiogram on 04/15/2019 LVEF of 35 to 40%,  regional wall motion abnormalities, global hypokinesis and moderately reduced RVEF.  Cardiology consulted.   R/LHC on 04/16/2019 Single vessel borderline obstructive CAD with smooth 50 to 60% proximal eccentric LAD stenosis, mild to moderate pulmonary HTN and LVEDP to 25-31 mmHg.  Medical management was recommended for CAD and NICM  Assessment & Plan: Acute hypoxic respiratory failure: Likely a combination of new systolic CHF  and COPD exacerbation.  Echocardiogram as below. PE and pneumonia excluded by CTA chest.  COVID-19 negative.  Currently on room air. -Treat treatable causes.  Acute systolic CHF/NICM: Echo with LVEF of 35 to 40%,   RWMA, global hypokinesis and moderately reduced RVEF.   R/LHC as above.  I and O urine output.  Weight downtrending.  Symptoms improving.  Creatinine slightly up. -Cardiology managing  -Increase Lasix to 40 mg twice daily  -Added low-dose losartan and Aldactone   -Low-dose metoprolol XL -Monitor fluid status, renal function and electrolytes -Sodium and fluid restriction.  Coronary atherosclerosis/CAD -LHC as above. -Cardiology managing-optimizing medical care -Encouraged smoking cessation-confident to quit. -High-dose statin and low-dose aspirin  COPD exacerbation: Has cardinal symptoms and diminished aeration bilaterally.  No formal diagnosis but on albuterol.  Has 55-pack-year history and is still smoking.  COVID-19 negative. -Switch to p.o. prednisone 50 mg daily for 3 more days -Continue doxycycline, budesonide, Brovana, as needed DuoNeb -Incentive spirometry and mucolytic's. -Smoking cessation as above.  Heterogeneous left thyroid nodules: hot nodules? He has low TSH and mildly elevated T4.  No history of thyroid disease.  Thyroid ultrasound revealed 1.1 cm left superior, 0.7 cm right mid and 0.6 m left inferior nodules.  Follow-up ultrasound in 1 year recommended for the bigger nodule. -Metoprolol as above  Uncontrolled NIDDM-2 with hyperglycemia: A1c 7.4%. CBG (last 3)  Recent Labs    04/16/19 2134 04/17/19 0544 04/17/19 0808  GLUCAP 380* 251* 292*  -Increase Lantus to 10 units twice daily,  -Increase NovoLog to 8 units AC  -Continue SSI-moderate -Continue high-dose statin.  Tachycardia: could be due to albuterol or hyperthyroidism.  Resolved. -Low-dose metoprolol as above.  Tobacco use disorder: 55-pack-year history and still smoking but started cutting down. -Encourage cessation-confident to quit. -Continue NicoDerm.  Anxiety: Stable -Continue home Valium  History of bladder cancer status post local excision -Outpatient follow-up  Essential hypertension: Normotensive. -Cardiac meds as above.  Leukocytosis: Likely demargination from steroid.  OSA: Noncompliant with CPAP. -Refused CPAP.    Nutrition Problem: Increased nutrient needs Etiology: acute illness(pleural effusions)  Signs/Symptoms: estimated needs   Interventions: Ensure Enlive (each supplement provides 350kcal and 20 grams of protein), MVI   DVT prophylaxis: Subcu Lovenox Code Status: Full code Family Communication: Patient and/or RN. Available if any question. Disposition Plan: Remains inpatient pending adequate diuresis, optimization of cardiac meds and clearance by cardiology Consultants: Cardiology  Procedures:  04/16/2019-R/LHC-resolved as above.  Microbiology summarized: COVID-19 screen negative.  Sch Meds:  Scheduled Meds: . arformoterol  15 mcg Nebulization BID  . aspirin  81 mg Oral Daily  . atorvastatin  80 mg Oral q1800  . baclofen  5 mg Oral BID  . benzonatate  100 mg Oral TID  . budesonide (PULMICORT) nebulizer solution  0.25 mg Nebulization BID  . doxycycline  100 mg Oral Q12H  . enoxaparin (LOVENOX) injection  40 mg Subcutaneous Q24H  . feeding supplement (ENSURE ENLIVE)  237 mL Oral BID BM  . furosemide  40 mg Intravenous Q12H  . guaiFENesin  600 mg Oral BID  . insulin aspart  0-15 Units Subcutaneous TID WC  . insulin aspart  0-5 Units Subcutaneous QHS  . insulin aspart  5 Units Subcutaneous TID WC  . insulin glargine  10 Units Subcutaneous BID  . losartan  25 mg Oral Daily  . mouth rinse  15 mL Mouth Rinse BID  . metoprolol succinate  12.5 mg Oral Daily  . multivitamin with minerals  1 tablet Oral  Daily  . nicotine  21 mg Transdermal Daily  . predniSONE  50 mg Oral Q breakfast  . sodium chloride flush  3 mL Intravenous Q12H  . spironolactone  12.5 mg Oral Daily   Continuous Infusions: . sodium chloride     PRN Meds:.sodium chloride, acetaminophen, acetaminophen, diazepam, ipratropium-albuterol, ondansetron (ZOFRAN) IV, ondansetron **OR** [DISCONTINUED] ondansetron (ZOFRAN) IV, sodium chloride flush  Antimicrobials: Anti-infectives (From admission, onward)   Start     Dose/Rate Route Frequency Ordered Stop   04/15/19 1430  doxycycline (VIBRA-TABS) tablet 100 mg     100 mg Oral Every 12 hours  04/15/19 1426         I have personally reviewed the following labs and images: CBC: Recent Labs  Lab 04/14/19 1729 04/16/19 0420 04/16/19 1523 04/16/19 1535 04/17/19 0451  WBC 10.4 18.1*  --   --  19.5*  HGB 15.5 14.9 15.0  15.0 14.6 14.5  HCT 46.8 43.9 44.0  44.0 43.0 43.8  MCV 90.2 88.2  --   --  90.1  PLT 366 345  --   --  352   BMP &GFR Recent Labs  Lab 04/14/19 1630 04/14/19 1729 04/15/19 0432 04/16/19 0420 04/16/19 1523 04/16/19 1535 04/17/19 0451  NA 138 138 138 138 138  138 123* 138  K 4.1 4.1 3.8 4.3 4.0  4.0 3.6 4.0  CL 102 103 100 99  --   --  99  CO2 25 25 26 28   --   --  27  GLUCOSE 124* 139* 200* 207*  --   --  256*  BUN 10 10 9 12   --   --  21  CREATININE 1.00 0.99 0.84 0.84  --   --  1.07  CALCIUM 9.6 9.2 9.4 9.6  --   --  9.2  MG  --   --  2.2 2.2  --   --  2.2   Estimated Creatinine Clearance: 84.8 mL/min (by C-G formula based on SCr of 1.07 mg/dL). Liver & Pancreas: No results for input(s): AST, ALT, ALKPHOS, BILITOT, PROT, ALBUMIN in the last 168 hours. No results for input(s): LIPASE, AMYLASE in the last 168 hours. No results for input(s): AMMONIA in the last 168 hours. Diabetic: Recent Labs    04/15/19 0432  HGBA1C 7.4*   Recent Labs  Lab 04/16/19 1139 04/16/19 1708 04/16/19 2134 04/17/19 0544 04/17/19 0808  GLUCAP 193* 247* 380* 251* 292*   Cardiac Enzymes: No results for input(s): CKTOTAL, CKMB, CKMBINDEX, TROPONINI in the last 168 hours. No results for input(s): PROBNP in the last 8760 hours. Coagulation Profile: No results for input(s): INR, PROTIME in the last 168 hours. Thyroid Function Tests: Recent Labs    04/15/19 0901  TSH 0.303*  FREET4 1.23*   Lipid Profile: Recent Labs    04/16/19 0420  CHOL 156  HDL 45  LDLCALC 101*  TRIG 48  CHOLHDL 3.5   Anemia Panel: No results for input(s): VITAMINB12, FOLATE, FERRITIN, TIBC, IRON, RETICCTPCT in the last 72 hours. Urine analysis: No results found for:  COLORURINE, APPEARANCEUR, LABSPEC, PHURINE, GLUCOSEU, HGBUR, BILIRUBINUR, KETONESUR, PROTEINUR, UROBILINOGEN, NITRITE, LEUKOCYTESUR Sepsis Labs: Invalid input(s): PROCALCITONIN, [place]  Microbiology: Recent Results (from the past 240 hour(s))  SARS CORONAVIRUS 2 (TAT 6-24 HRS) Nasopharyngeal Nasopharyngeal Swab     Status: None   Collection Time: 04/14/19  7:15 PM   Specimen: Nasopharyngeal Swab  Result Value Ref Range Status   SARS Coronavirus 2 NEGATIVE NEGATIVE Final    Comment: (  NOTE) SARS-CoV-2 target nucleic acids are NOT DETECTED. The SARS-CoV-2 RNA is generally detectable in upper and lower respiratory specimens during the acute phase of infection. Negative results do not preclude SARS-CoV-2 infection, do not rule out co-infections with other pathogens, and should not be used as the sole basis for treatment or other patient management decisions. Negative results must be combined with clinical observations, patient history, and epidemiological information. The expected result is Negative. Fact Sheet for Patients: SugarRoll.be Fact Sheet for Healthcare Providers: https://www.woods-mathews.com/ This test is not yet approved or cleared by the [place] FDA and  has been authorized for detection and/or diagnosis of SARS-CoV-2 by FDA under an Emergency Use Authorization (EUA). This EUA will remain  in effect (meaning this test can be used) for the duration of the COVID-19 declaration under Section 56 4(b)(1) of the Act, 21 U.S.C. section 360bbb-3(b)(1), unless the authorization is terminated or revoked sooner. Performed at [facility], [place] [address], [place], [place] [address]     Radiology Studies: No results found.   35 minutes with more than 50% spent in reviewing records, counseling patient/family and coordinating care.  Taye T. Leslie  If 7PM-7AM, please contact night-coverage [email or web address]  Password [facility] 04/17/2019, 11:23 AM

## 2019-04-18 DIAGNOSIS — F419 Anxiety disorder, unspecified: Secondary | ICD-10-CM

## 2019-04-18 DIAGNOSIS — E669 Obesity, unspecified: Secondary | ICD-10-CM

## 2019-04-18 LAB — BASIC METABOLIC PANEL
Anion gap: 12 (ref 5–15)
BUN: 19 mg/dL (ref 8–23)
CO2: 28 mmol/L (ref 22–32)
Calcium: 9 mg/dL (ref 8.9–10.3)
Chloride: 99 mmol/L (ref 98–111)
Creatinine, Ser: 0.95 mg/dL (ref 0.61–1.24)
GFR calc Af Amer: >60 mL/min (ref >60)
GFR calc non Af Amer: >60 mL/min (ref >60)
Glucose, Bld: 136 mg/dL — ABNORMAL HIGH (ref 70–99)
Potassium: 3.2 mmol/L — ABNORMAL LOW (ref 3.5–5.1)
Sodium: 139 mmol/L (ref 135–145)

## 2019-04-18 LAB — CBC
HCT: 44.4 % (ref 39.0–52.0)
Hemoglobin: 14.6 g/dL (ref 13.0–17.0)
MCH: 29.3 pg (ref 26.0–34.0)
MCHC: 32.9 g/dL (ref 30.0–36.0)
MCV: 89.2 fL (ref 80.0–100.0)
Platelets: 361 10*3/uL (ref 150–400)
RBC: 4.98 MIL/uL (ref 4.22–5.81)
RDW: 12.2 % (ref 11.5–15.5)
WBC: 16.4 10*3/uL — ABNORMAL HIGH (ref 4.0–10.5)
nRBC: 0 % (ref 0.0–0.2)

## 2019-04-18 LAB — GLUCOSE, CAPILLARY
Glucose-Capillary: 113 mg/dL — ABNORMAL HIGH (ref 70–99)
Glucose-Capillary: 147 mg/dL — ABNORMAL HIGH (ref 70–99)

## 2019-04-18 LAB — MAGNESIUM: Magnesium: 2.2 mg/dL (ref 1.7–2.4)

## 2019-04-18 MED ORDER — SACUBITRIL-VALSARTAN 24-26 MG PO TABS: 1.0000 | ORAL_TABLET | Freq: Two times a day (BID) | ORAL | 1 refills | Status: DC

## 2019-04-18 MED ORDER — POTASSIUM CHLORIDE CRYS ER 20 MEQ PO TBCR: 40.0000 meq | EXTENDED_RELEASE_TABLET | Freq: Every day | ORAL | 0 refills | Status: DC

## 2019-04-18 MED ORDER — ATORVASTATIN CALCIUM 80 MG PO TABS: 80.0000 mg | ORAL_TABLET | Freq: Every day | ORAL | 1 refills | Status: DC

## 2019-04-18 MED ORDER — FUROSEMIDE 40 MG PO TABS
40.0000 mg | ORAL_TABLET | Freq: Every day | ORAL | Status: DC
  Administered 2019-04-18: 40 mg via ORAL
  Filled 2019-04-18: qty 1

## 2019-04-18 MED ORDER — NICOTINE 21 MG/24HR TD PT24: 21.0000 mg | MEDICATED_PATCH | Freq: Every day | TRANSDERMAL | 0 refills | Status: DC

## 2019-04-18 MED ORDER — ADULT MULTIVITAMIN W/MINERALS CH: 1.0000 | ORAL_TABLET | Freq: Every day | ORAL | 1 refills | Status: DC

## 2019-04-18 MED ORDER — DOXYCYCLINE HYCLATE 100 MG PO TABS: 100.0000 mg | ORAL_TABLET | Freq: Two times a day (BID) | ORAL | 0 refills | Status: DC

## 2019-04-18 MED ORDER — SACUBITRIL-VALSARTAN 24-26 MG PO TABS
1.0000 | ORAL_TABLET | Freq: Two times a day (BID) | ORAL | Status: DC
  Administered 2019-04-18: 1 via ORAL
  Filled 2019-04-18: qty 1

## 2019-04-18 MED ORDER — GLIPIZIDE 5 MG PO TABS: 5.0000 mg | ORAL_TABLET | Freq: Two times a day (BID) | ORAL | 0 refills | Status: DC

## 2019-04-18 MED ORDER — POTASSIUM CHLORIDE CRYS ER 20 MEQ PO TBCR
40.0000 meq | EXTENDED_RELEASE_TABLET | ORAL | Status: AC
  Administered 2019-04-18 (×2): 40 meq via ORAL
  Filled 2019-04-18: qty 2
  Filled 2019-04-18: qty 4

## 2019-04-18 MED ORDER — GUAIFENESIN ER 600 MG PO TB12: 600.0000 mg | ORAL_TABLET | Freq: Two times a day (BID) | ORAL | 0 refills | Status: DC

## 2019-04-18 MED ORDER — ANORO ELLIPTA 62.5-25 MCG/INH IN AEPB: 1.0000 | INHALATION_SPRAY | Freq: Every day | RESPIRATORY_TRACT | 1 refills | Status: DC

## 2019-04-18 MED ORDER — SPIRONOLACTONE 25 MG PO TABS: 12.5000 mg | ORAL_TABLET | Freq: Every day | ORAL | 1 refills | Status: DC

## 2019-04-18 MED ORDER — BENZONATATE 100 MG PO CAPS: 100.0000 mg | ORAL_CAPSULE | Freq: Three times a day (TID) | ORAL | 0 refills | Status: DC

## 2019-04-18 MED ORDER — ADULT MULTIVITAMIN W/MINERALS CH: 1.0000 | ORAL_TABLET | Freq: Every day | ORAL | 1 refills | Status: AC

## 2019-04-18 MED ORDER — ASPIRIN 81 MG PO CHEW: 81.0000 mg | CHEWABLE_TABLET | Freq: Every day | ORAL | 0 refills | Status: DC

## 2019-04-18 MED ORDER — FUROSEMIDE 40 MG PO TABS: 40.0000 mg | ORAL_TABLET | Freq: Every day | ORAL | 0 refills | Status: DC

## 2019-04-18 MED ORDER — METOPROLOL SUCCINATE ER 25 MG PO TB24
25.0000 mg | ORAL_TABLET | Freq: Every day | ORAL | Status: DC
  Administered 2019-04-18: 25 mg via ORAL
  Filled 2019-04-18: qty 1

## 2019-04-18 MED ORDER — ASPIRIN 81 MG PO CHEW: 81.0000 mg | CHEWABLE_TABLET | Freq: Every day | ORAL | 0 refills | Status: AC

## 2019-04-18 MED ORDER — METOPROLOL SUCCINATE ER 25 MG PO TB24: 25.0000 mg | ORAL_TABLET | Freq: Every day | ORAL | 1 refills | Status: DC

## 2019-04-18 NOTE — Progress Notes (Signed)
Patient discharged: Home with family  Via: Wheelchair   Discharge paperwork given: to patient and family  Reviewed with teach back  IV and telemetry disconnected  Belongings given to patient  Initial pharmacy closed per family member and it will reopen tomorrow,  however patient has medication that he needs to take in the afternoon. Paged MD and prescription send to [facility] per family request.   Primary nurse Sian aware and informed.

## 2019-04-18 NOTE — Discharge Summary (Signed)
Physician Discharge Summary  Timothy Lee [id number] DOB: Nov 07, 1950 DOA: 04/14/2019  PCP: Lauree Chandler, NP  Admit date: 04/14/2019 Discharge date: 04/18/2019  Admitted From: Home Disposition: Home  Recommendations for Outpatient Follow-up:  1. Follow up with PCP in 3 days, and cardiology in 1 to 2 weeks 2. Please obtain CBC/BMP/Mag at follow up 3. Please follow up on the following pending results: None  Home Health: None Equipment/Devices: None  Discharge Condition: Stable CODE STATUS: Full code  Follow-up Information    Lauree Chandler, NP. Call in 3 day(s).   Specialty: Geriatric Medicine Contact information: [address]. [place] [address] [id number]        Sueanne Margarita, MD. Call in 1 week(s).   Specialty: Cardiology Contact information: [id number] [place] [address] [phone]           Hospital Course: 68 year old male with history of DM-2, HTN, untreated OSA, 55-pack-year history and current smoker presenting with worsening shortness of breath and productive cough.  Recently returned from [facility] 2 weeks ago.  Treated outpatient with Z-Pak, Tessalon Perles and home albuterol without significant improvement in his breathing.  Called PCP who advised him to go to urgent care.  At UC, had rales, strong cough, tachycardia, DOE, desaturation to 89% on RA and CXR concerning for CHF.  He was referred to ED.   In ED, tachycardic, tachypneic and hypertensive.  Saturating at 95% on room air.  BMP unremarkable except for mildly elevated glucose.  BNP 288.  CBC unremarkable.  COVID-19 negative.  CTA chest negative for PE but concerning for CHF, mediastinal lymphadenopathy and heterogeneous left thyroid lobe nodule.  EKG with sinus tachycardia, LAE and nonspecific T wave changes.  Started on IV Lasix, IV Solu-Medrol and breathing treatments and admitted for acute hypoxic respiratory failure due to new CHF and COPD  exacerbation.  Patient has no formal diagnosis of COPD but has been on as needed albuterol.  Echocardiogram on 04/15/2019 with LVEF of35 to 40%,regional wall motion abnormalities,global hypokinesis and moderately reducedRVEF.Cardiology consulted.  He had heart catheterization on 04/16/2019.revealed single vessel borderline obstructive CAD, mild to moderate pulmonary HTN and LVEDP to 25-31 mmHg.  Medical management was recommended for CAD and NICM Patient was diuresed with IV Lasix.  Started on GDMT including Entresto, Toprol-XL and Aldactone and cleared for discharge by cardiology.  Outpatient follow-up as above.  From COPD standpoint, respiratory symptoms improved and he felt ready to go home.  Discharged on doxycycline to complete course.  Started on Anoro Ellipta in addition to his as needed albuterol.  Was also given Rx for Tessalon Perles and Mucinex.  Encouraged to quit smoking.  Was provided with Rx for nicotine patch if needed.  See individual problem list below for more on hospital course.  Subjective: Very anxious and adamant about going home today.  He states he feels well.  Denies chest pain, dyspnea, GI or GU symptoms.  Discharge Diagnoses:  Acute hypoxic respiratory failure: Likely a combination of new systolic CHF and COPD exacerbation.  Echocardiogram as below. PE and pneumonia excluded by CTA chest.  COVID-19 negative.  Currently on room air. -Treat treatable causes.  Acute systolic CHF/NICM: Echo with LVEF of35 to 40%, RWMA,global hypokinesis and moderately reducedRVEF.  R/LHC as above.  Excellent urine output.  Discharge weight 235.2 pounds.  No cardiopulmonary symptoms. -Discharged on Lasix, Entresto, Toprol-XL, Aldactone, KCl, statin and aspirin as below -Recheck renal function, electrolytes and magnesium at  follow-up in 3 days. -Counseled on fluid and sodium restriction, as well as daily weights  Coronary atherosclerosis/CAD: No anginal symptoms. -LHC as  above. -Cardiac meds as above.  COPD exacerbation: Has cardinal symptoms and diminished aeration bilaterally.  No formal diagnosis but on albuterol.  Has 55-pack-year history and is still smoking.  COVID-19 negative. -Discharged on doxycycline, Anoro Ellipta and as needed albuterol -Tessalon and Mucinex as needed for cough -Tobacco cessation counseling and nicotine patch provided  Heterogeneous left thyroid nodules: hot nodules? He has low TSH and mildly elevated T4.  No history of thyroid disease.  Thyroid ultrasound revealed 1.1 cm left superior, 0.7 cm right mid and 0.6 m left inferior nodules.  Follow-up ultrasound in 1 year recommended for the bigger nodule. -Metoprolol as above  Uncontrolled NIDDM-2 with hyperglycemia: A1c 7.4%. CBG (last 3)  Recent Labs    04/17/19 2128 04/18/19 0031 04/18/19 0612  GLUCAP 186* 113* 147*  -Added glipizide 5 mg twice daily -Continue home Metformin -Started as above. -Could benefit from newer agents with cardiovascular benefit down the road.  Tobacco use disorder: 55-pack-year history and still smoking but started cutting down. -Cessation counseling and nicotine patch provided.  Anxiety: Somewhat anxious. -Discharged on home Valium.  May consider SSRI long-term.  History of bladder cancer status post local excision -Outpatient follow-up  Essential hypertension: Normotensive. -Cardiac meds as above.  Leukocytosis: Likely demargination from steroid.  OSA: Noncompliant with CPAP. -Encourage CPAP.  May need re-evaluation of sleep clinic  Nutrition Problem: Increased nutrient needs Etiology: acute illness(pleural effusions) -Discharged on multivitamin.   Discharge Instructions  Discharge Instructions    (HEART FAILURE PATIENTS) Call MD:  Anytime you have any of the following symptoms: 1) 3 pound weight gain in 24 hours or 5 pounds in 1 week 2) shortness of breath, with or without a dry hacking cough 3) swelling in the hands,  feet or stomach 4) if you have to sleep on extra pillows at night in order to breathe.   Complete by: As directed    Call MD for:  difficulty breathing, headache or visual disturbances   Complete by: As directed    Call MD for:  extreme fatigue   Complete by: As directed    Call MD for:  persistant dizziness or light-headedness   Complete by: As directed    Call MD for:  persistant nausea and vomiting   Complete by: As directed    Diet - low sodium heart healthy   Complete by: As directed    Diet Carb Modified   Complete by: As directed    Discharge instructions   Complete by: As directed    It has been a pleasure taking care of you! You were hospitalized with shortness of breath and cough which will likely due to heart failure and COPD exacerbation.  You have been evaluated and started on the medication for these conditions.  We are discharging you more of these medications to continue taking at home.  It is very important that you take your medications as prescribed.  We have also made some adjustment to your home medications. Please review your new medication list and the directions before you take your medications.    In addition to your medications, it is important that you limit the amount of water/fluid you drink to less than 6 cups (1500 cc) a day.  It is also important that you limit your sodium (salt) intake to less than 2 g (2000 mg) a day.  We  recommend weighing yourself daily at the same time and keeping your weight log.   We we also strongly encourage you to quit smoking cigarettes.  We gave you a prescription for nicotine patch in case you need them.    We encourage you to use your CPAP at night.   Please follow-up with your primary care doctor and your cardiologist.  You may have to call to make this appointment as recommended under the follow-up section.  Take care,   Increase activity slowly   Complete by: As directed      Allergies as of 04/18/2019      Reactions     Chantix [varenicline Tartrate]    Aggression       Medication List    STOP taking these medications   amLODipine-benazepril 5-20 MG capsule Commonly known as: LOTREL   ibuprofen 200 MG tablet Commonly known as: ADVIL     TAKE these medications   acetaminophen 325 MG tablet Commonly known as: TYLENOL Take 650 mg by mouth every 6 (six) hours as needed for moderate pain.   albuterol 108 (90 Base) MCG/ACT inhaler Commonly known as: VENTOLIN HFA Inhale 2 puffs into the lungs every 6 (six) hours as needed for wheezing or shortness of breath.   Anoro Ellipta 62.5-25 MCG/INH Aepb Generic drug: umeclidinium-vilanterol Inhale 1 puff into the lungs daily.   aspirin 81 MG chewable tablet Chew 1 tablet (81 mg total) by mouth daily. Start taking on: April 19, 2019   atorvastatin 80 MG tablet Commonly known as: LIPITOR Take 1 tablet (80 mg total) by mouth daily at 6 PM.   Baclofen 5 MG Tabs Take 5 mg by mouth daily.   benzonatate 100 MG capsule Commonly known as: TESSALON Take 1 capsule (100 mg total) by mouth 3 (three) times daily.   doxycycline 100 MG tablet Commonly known as: VIBRA-TABS Take 1 tablet (100 mg total) by mouth every 12 (twelve) hours.   furosemide 40 MG tablet Commonly known as: LASIX Take 1 tablet (40 mg total) by mouth daily. Start taking on: April 19, 2019   glipiZIDE 5 MG tablet Commonly known as: GLUCOTROL Take 1 tablet (5 mg total) by mouth 2 (two) times daily.   guaiFENesin 600 MG 12 hr tablet Commonly known as: MUCINEX Take 1 tablet (600 mg total) by mouth 2 (two) times daily.   metFORMIN 1000 MG tablet Commonly known as: GLUCOPHAGE Take 1 tablet (1,000 mg total) by mouth 2 (two) times daily with a meal.   metoprolol succinate 25 MG 24 hr tablet Commonly known as: TOPROL-XL Take 1 tablet (25 mg total) by mouth daily. Start taking on: April 19, 2019   multivitamin with minerals Tabs tablet Take 1 tablet by mouth daily. Start  taking on: April 19, 2019   nicotine 21 mg/24hr patch Commonly known as: NICODERM CQ - dosed in mg/24 hours Place 1 patch (21 mg total) onto the skin daily. Start taking on: April 19, 2019   potassium chloride SA 20 MEQ tablet Commonly known as: KLOR-CON Take 2 tablets (40 mEq total) by mouth daily.   sacubitril-valsartan 24-26 MG Commonly known as: ENTRESTO Take 1 tablet by mouth 2 (two) times daily.   spironolactone 25 MG tablet Commonly known as: ALDACTONE Take 0.5 tablets (12.5 mg total) by mouth daily. Start taking on: April 19, 2019       Consultations:  Cardiology  Procedures/Studies:  2D Echo on 04/15/2019 1. Left ventricular ejection fraction, by visual estimation, is 35 to  40%. The left ventricle has severely decreased function. There is no left ventricular hypertrophy. Global hypokinesis. Though not optimally visualized, there appears to be relatively  worse function of the lateral LV wall. Mildly dilated left ventricular internal cavity size.  2. Left ventricular diastolic parameters are indeterminate.  3. Global right ventricle has moderately reduced systolic function.The right ventricular size is normal. No increase in right ventricular wall thickness.  4. Left atrial size was normal.  5. Right atrial size was normal.  6. The mitral valve is normal in structure. Mild mitral valve regurgitation. No evidence of mitral stenosis.  7. The tricuspid valve is normal in structure. Tricuspid valve regurgitation is trivial.  8. The aortic valve is normal in structure. Aortic valve regurgitation is not visualized. Mild aortic valve sclerosis without stenosis.  9. The pulmonic valve was not well visualized. Pulmonic valve regurgitation is trivial. 10. The inferior vena cava is dilated in size with >50% respiratory variability, suggesting right atrial pressure of 8 mmHg. 11. Trivial pericardial effusion is present.   Right and left heart cardioversion on  04/16/2019  Prox LAD lesion is 55% stenosed.   Single-vessel coronary obstructive disease with smooth 50 to 60% proximal eccentric LAD stenosis; normal small ramus intermediate, left circumflex, and dominant RCA.  Mild to moderate right heart pressure elevation with moderate pulmonary hypertension with PA pressure 51/23, mean pressure 39.  LVEDP 25 - 31 mm Hg.  RECOMMENDATION: Guideline directed medical therapy for nonischemic cardiomyopathy.  Smoking cessation is essential.  Aggressive lipid-lowering therapy in attempt to induce plaque regression with target LDL less than 70 and preferably 50s or below.  Transition ACE-I to [facility] consider aldosterone blockade with spironolactone.  Dg Chest 2 View  Result Date: 04/14/2019 CLINICAL DATA:  Cough and shortness of breath despite taking azithromycin. Smoker. EXAM: CHEST - 2 VIEW COMPARISON:  Radiograph 09/18/2017 FINDINGS: Mild cardiomegaly. Unchanged aortic tortuosity compared to prior exam. Interstitial thickening most consistent with pulmonary edema. Small bilateral pleural effusions. No confluent airspace disease. No pneumothorax. No acute osseous abnormalities are seen. Mild wedging of lower thoracic vertebra is unchanged. IMPRESSION: Mild cardiomegaly with small pleural effusions. Interstitial thickening suspicious for pulmonary edema. Findings are most consistent with CHF. Electronically Signed   By: Keith Rake [name]   On: 04/14/2019 15:38   Ct Angio Chest Pe W And/or Wo Contrast  Result Date: 04/14/2019 CLINICAL DATA:  Chest pain shortness of breath for 6 days, cough EXAM: CT ANGIOGRAPHY CHEST WITH CONTRAST TECHNIQUE: Multidetector CT imaging of the chest was performed using the standard protocol during bolus administration of intravenous contrast. Multiplanar CT image reconstructions and MIPs were obtained to evaluate the vascular anatomy. CONTRAST:  118mL OMNIPAQUE IOHEXOL 350 MG/ML SOLN COMPARISON:  Chest radiograph 04/04/2019  FINDINGS: Cardiovascular: Satisfactory opacification the pulmonary arteries to the segmental level. No pulmonary artery filling defects are identified. Central pulmonary arteries are normal caliber. No elevation of the RV/LV ratio (0.73). Mild cardiomegaly. Trace pericardial fluid. Coronary artery calcifications are noted. Thoracic aortic caliber is at the upper limits of normal. Minimal atherosclerotic plaque in the aorta. The left vertebral artery arises directly from the aortic arch. Minimal atherosclerotic plaque in the great vessels. Mediastinum/Nodes: Low-attenuation mediastinal and hilar nodes are present. Several larger low-attenuation nodes include a 12 mm subcarinal lymph node (5/73), a 11 mm left carinal lymph node (5/61) and a 12 mm right carinal lymph node (5/70). There is a heterogeneously enlarged left lobe thyroid gland. Discrete nodularity difficult to ascertain. Bowing of the trachea  compatible with imaging during exhalation. Mild diffuse peribronchovascular thickening is noted. No acute abnormality of the esophagus. Lungs/Pleura: Moderate bilateral effusions with adjacent passive atelectasis in the airless lung. Additional fluid seen tracking into the fissures with adjacent passive atelectasis of the upper lobes as well. Interlobular septal thickening is noted in the lung bases and apices with some central peribronchovascular opacity. Interspersed areas of ground-glass opacity are noted. No pneumothorax. Upper Abdomen: Mild bilateral symmetric perinephric stranding, a nonspecific finding though may correlate with either age or decreased renal function. No acute abnormalities present in the visualized portions of the upper abdomen. Musculoskeletal: T11 anterior wedging is similar to comparison from 2018. Multilevel degenerative changes are present in the imaged portions of the spine. No acute osseous abnormality or suspicious osseous lesion. No suspicious chest wall lesions. Review of the MIP  images confirms the above findings. IMPRESSION: 1. No evidence of pulmonary embolism. 2. Moderate bilateral pleural effusions with adjacent passive atelectasis in the airless lung. 3. Interlobular septal thickening in the lung bases and apices with some central peribronchovascular and ground-glass opacity, most suggestive of pulmonary edema. 4. Low-attenuation mediastinal and hilar nodes, likely reactive/edematous. 5. Heterogeneous, enlarged left lobe thyroid gland. Discrete nodularity difficult to ascertain. Consider further evaluation with thyroid ultrasound. This follows consensus guidelines: Managing Incidental Thyroid Nodules Detected on Imaging: White Paper of the ACR Incidental Thyroid Findings Committee. J Am Coll Radiol 2015; 12:143-150. and [facility] 3-tiered system for managing ITNs: J Am Coll Radiol. 2015; Feb;12(2): 143-50 6. Aortic Atherosclerosis (ICD10-I70.0). 7. Cardiomegaly.  Coronary atherosclerosis. Electronically Signed   By: Lovena Le [name]   On: 04/14/2019 22:54   US Thyroid  Result Date: 04/16/2019 CLINICAL DATA:  Incidental on CT. Enlargement of left lobe of the thyroid gland by CT. EXAM: THYROID ULTRASOUND TECHNIQUE: Ultrasound examination of the thyroid gland and adjacent soft tissues was performed. COMPARISON:  CTA of the chest on 04/14/2019 FINDINGS: Parenchymal Echotexture: Mildly heterogenous Isthmus: 0.9 cm Right lobe: 3.8 x 2.0 x 1.4 cm Left lobe: 3.1 x 1.9 x 1.7 cm (mediastinal extension of left thyroid goiter likely not fully visualized by ultrasound) _________________________________________________________ Estimated total number of nodules >/= 1 cm: 1 Number of spongiform nodules >/=  2 cm not described below (TR1): 0 Number of mixed cystic and solid nodules >/= 1.5 cm not described below ([place]): 0 _________________________________________________________ Nodule # 1: Location: Right; Mid Maximum size: 0.7 cm; Other 2 dimensions: 0.7 x 0.7 cm Composition: spongiform (0)  Echogenicity: anechoic (0) Shape: not taller-than-wide (0) Margins: smooth (0) Echogenic foci: none (0) ACR TI-RADS total points: 0. ACR TI-RADS risk category: TR1 (0-1 points). ACR TI-RADS recommendations: This nodule does NOT meet TI-RADS criteria for biopsy or dedicated follow-up. _________________________________________________________ Nodule # 2: Location: Left; Superior Maximum size: 1.1 cm; Other 2 dimensions: 0.8 x 0.9 cm Composition: solid/almost completely solid (2) Echogenicity: hypoechoic (2) Shape: not taller-than-wide (0) Margins: smooth (0) Echogenic foci: none (0) ACR TI-RADS total points: 4. ACR TI-RADS risk category: TR4 (4-6 points). ACR TI-RADS recommendations: *Given size (>/= 1 - 1.4 cm) and appearance, a follow-up ultrasound in 1 year should be considered based on TI-RADS criteria. _________________________________________________________ Nodule # 3: Location: Left; Inferior Maximum size: 0.6 cm; Other 2 dimensions: 0.6 x 0.6 cm Composition: mixed cystic and solid (1) Echogenicity: isoechoic (1) Shape: not taller-than-wide (0) Margins: ill-defined (0) Echogenic foci: none (0) ACR TI-RADS total points: 2. ACR TI-RADS risk category: TR2 (2 points). ACR TI-RADS recommendations: This nodule does NOT meet TI-RADS criteria for biopsy  or dedicated follow-up. _________________________________________________________ No abnormal lymph nodes identified. IMPRESSION: 1.1 cm left superior thyroid nodule meets criteria for 1 year follow-up ultrasound. 0.7 cm right mid and 0.6 cm left inferior nodules do not meet criteria for biopsy or further follow-up. The above is in keeping with the ACR TI-RADS recommendations - J Am Coll Radiol 2017;14:587-595. Electronically Signed   By: Aletta Edouard [name]   On: 04/16/2019 08:28      Discharge Exam: Vitals:   04/18/19 0833 04/18/19 0835  BP:    Pulse:    Resp:    Temp:    SpO2: 95% 95%    GENERAL: No acute distress.  Appears well.  HEENT: MMM.   Vision and hearing grossly intact.  NECK: Supple.  No apparent JVD.  RESP:  No IWOB. Good air movement bilaterally. CVS:  RRR. Heart sounds normal.  ABD/GI/GU: Bowel sounds present. Soft. Non tender.  MSK/EXT:  Moves extremities. No apparent deformity or edema.  SKIN: no apparent skin lesion or wound NEURO: Awake, alert and oriented appropriately.  No gross deficit.  PSYCH: Calm. Normal affect.   The results of significant diagnostics from this hospitalization (including imaging, microbiology, ancillary and laboratory) are listed below for reference.     Microbiology: Recent Results (from the past 240 hour(s))  SARS CORONAVIRUS 2 (TAT 6-24 HRS) Nasopharyngeal Nasopharyngeal Swab     Status: None   Collection Time: 04/14/19  7:15 PM   Specimen: Nasopharyngeal Swab  Result Value Ref Range Status   SARS Coronavirus 2 NEGATIVE NEGATIVE Final    Comment: (NOTE) SARS-CoV-2 target nucleic acids are NOT DETECTED. The SARS-CoV-2 RNA is generally detectable in upper and lower respiratory specimens during the acute phase of infection. Negative results do not preclude SARS-CoV-2 infection, do not rule out co-infections with other pathogens, and should not be used as the sole basis for treatment or other patient management decisions. Negative results must be combined with clinical observations, patient history, and epidemiological information. The expected result is Negative. Fact Sheet for Patients: SugarRoll.be Fact Sheet for Healthcare Providers: https://www.woods-mathews.com/ This test is not yet approved or cleared by the [place] FDA and  has been authorized for detection and/or diagnosis of SARS-CoV-2 by FDA under an Emergency Use Authorization (EUA). This EUA will remain  in effect (meaning this test can be used) for the duration of the COVID-19 declaration under Section 56 4(b)(1) of the Act, 21 U.S.C. section 360bbb-3(b)(1), unless  the authorization is terminated or revoked sooner. Performed at [facility], [facility] [address] [place], [place] [address]      Labs: BNP (last 3 results) Recent Labs    04/14/19 1645  BNP [id number]*   Basic Metabolic Panel: Recent Labs  Lab 04/14/19 1729 04/15/19 0432 04/16/19 0420 04/16/19 1523 04/16/19 1535 04/17/19 0451 04/18/19 0505  NA 138 138 138 138   138 123* 138 139  K 4.1 3.8 4.3 4.0   4.0 3.6 4.0 3.2*  CL 103 100 99  --   --  99 99  CO2 25 26 28   --   --  27 28  GLUCOSE 139* 200* 207*  --   --  256* 136*  BUN 10 9 12   --   --  21 19  CREATININE 0.99 0.84 0.84  --   --  1.07 0.95  CALCIUM 9.2 9.4 9.6  --   --  9.2 9.0  MG  --  2.2 2.2  --   --  2.2 2.2  Liver Function Tests: No results for input(s): AST, ALT, ALKPHOS, BILITOT, PROT, ALBUMIN in the last 168 hours. No results for input(s): LIPASE, AMYLASE in the last 168 hours. No results for input(s): AMMONIA in the last 168 hours. CBC: Recent Labs  Lab 04/14/19 1729 04/16/19 0420 04/16/19 1523 04/16/19 1535 04/17/19 0451 04/18/19 0505  WBC 10.4 18.1*  --   --  19.5* 16.4*  HGB 15.5 14.9 15.0   15.0 14.6 14.5 14.6  HCT 46.8 43.9 44.0   44.0 43.0 43.8 44.4  MCV 90.2 88.2  --   --  90.1 89.2  PLT 366 345  --   --  352 361   Cardiac Enzymes: No results for input(s): CKTOTAL, CKMB, CKMBINDEX, TROPONINI in the last 168 hours. BNP: Invalid input(s): POCBNP CBG: Recent Labs  Lab 04/17/19 1216 04/17/19 1721 04/17/19 2128 04/18/19 0031 04/18/19 0612  GLUCAP 279* 389* 186* 113* 147*   D-Dimer No results for input(s): DDIMER in the last 72 hours. Hgb A1c No results for input(s): HGBA1C in the last 72 hours. Lipid Profile Recent Labs    04/16/19 0420  CHOL 156  HDL 45  LDLCALC 101*  TRIG 48  CHOLHDL 3.5   Thyroid function studies No results for input(s): TSH, T4TOTAL, T3FREE, THYROIDAB in the last 72 hours.  Invalid input(s): FREET3 Anemia work up No results for input(s):  VITAMINB12, FOLATE, FERRITIN, TIBC, IRON, RETICCTPCT in the last 72 hours. Urinalysis No results found for: COLORURINE, APPEARANCEUR, LABSPEC, PHURINE, GLUCOSEU, HGBUR, BILIRUBINUR, KETONESUR, PROTEINUR, UROBILINOGEN, NITRITE, LEUKOCYTESUR Sepsis Labs Invalid input(s): PROCALCITONIN,  WBC,  LACTICIDVEN   Time coordinating discharge: 45 minutes  SIGNED:  Mercy Riding, MD  [facility] 04/18/2019, 5:33 PM  If 7PM-7AM, please contact night-coverage [email or web address] Password [email or web address]

## 2019-04-18 NOTE — Discharge Instructions (Signed)
Heart Failure, Diagnosis ° °Heart failure means that your heart is not able to pump blood in the right way. This makes it hard for your body to work well. Heart failure is usually a long-term (chronic) condition. You must take good care of yourself and follow your treatment plan from your doctor. °What are the causes? °This condition may be caused by: °· High blood pressure. °· Build up of cholesterol and fat in the arteries. °· Heart attack. This injures the heart muscle. °· Heart valves that do not open and close properly. °· Damage of the heart muscle. This is also called cardiomyopathy. °· Lung disease. °· Abnormal heart rhythms. °What increases the risk? °The risk of heart failure goes up as a person ages. This condition is also more likely to develop in people who: °· Are overweight. °· Are male. °· Smoke or chew tobacco. °· Abuse alcohol or illegal drugs. °· Have taken medicines that can damage the heart. °· Have diabetes. °· Have abnormal heart rhythms. °· Have thyroid problems. °· Have low blood counts (anemia). °What are the signs or symptoms? °Symptoms of this condition include: °· Shortness of breath. °· Coughing. °· Swelling of the feet, ankles, legs, or belly. °· Losing weight for no reason. °· Trouble breathing. °· Waking from sleep because of the need to sit up and get more air. °· Rapid heartbeat. °· Being very tired. °· Feeling dizzy, or feeling like you may pass out (faint). °· Having no desire to eat. °· Feeling like you may vomit (nauseous). °· Peeing (urinating) more at night. °· Feeling confused. °How is this treated? ° °  ° °This condition may be treated with: °· Medicines. These can be given to treat blood pressure and to make the heart muscles stronger. °· Changes in your daily life. These may include eating a healthy diet, staying at a healthy body weight, quitting tobacco and illegal drug use, or doing exercises. °· Surgery. Surgery can be done to open blocked valves, or to put devices in  the heart, such as pacemakers. °· A donor heart (heart transplant). You will receive a healthy heart from a donor. °Follow these instructions at home: °· Treat other conditions as told by your doctor. These may include high blood pressure, diabetes, thyroid disease, or abnormal heart rhythms. °· Learn as much as you can about heart failure. °· Get support as you need it. °· Keep all follow-up visits as told by your doctor. This is important. °Summary °· Heart failure means that your heart is not able to pump blood in the right way. °· This condition is caused by high blood pressure, heart attack, or damage of the heart muscle. °· Symptoms of this condition include shortness of breath and swelling of the feet, ankles, legs, or belly. You may also feel very tired or feel like you may vomit. °· You may be treated with medicines, surgery, or changes in your daily life. °· Treat other health conditions as told by your doctor. °This information is not intended to replace advice given to you by your health care provider. Make sure you discuss any questions you have with your health care provider. °Document Released: 02/13/2008 Document Revised: 07/24/2018 Document Reviewed: 07/24/2018 °Elsevier Patient Education © 2020 [employer] ° °

## 2019-04-18 NOTE — Progress Notes (Addendum)
Progress Note  Patient Name: Timothy Lee Date of Encounter: 04/18/2019  Primary Cardiologist: Fransico Him, MD   Subjective   Feels good and anxious to go home today.  Denies any SOB or CP  Inpatient Medications    Scheduled Meds: . arformoterol  15 mcg Nebulization BID  . aspirin  81 mg Oral Daily  . atorvastatin  80 mg Oral q1800  . baclofen  5 mg Oral BID  . benzonatate  100 mg Oral TID  . budesonide (PULMICORT) nebulizer solution  0.25 mg Nebulization BID  . doxycycline  100 mg Oral Q12H  . enoxaparin (LOVENOX) injection  40 mg Subcutaneous Q24H  . feeding supplement (ENSURE ENLIVE)  237 mL Oral BID BM  . furosemide  40 mg Intravenous Q12H  . guaiFENesin  600 mg Oral BID  . insulin aspart  0-15 Units Subcutaneous TID WC  . insulin aspart  0-5 Units Subcutaneous QHS  . insulin aspart  5 Units Subcutaneous TID WC  . insulin glargine  20 Units Subcutaneous BID  . losartan  25 mg Oral Daily  . mouth rinse  15 mL Mouth Rinse BID  . metoprolol succinate  12.5 mg Oral Daily  . multivitamin with minerals  1 tablet Oral Daily  . nicotine  21 mg Transdermal Daily  . potassium chloride  40 mEq Oral Q4H  . predniSONE  50 mg Oral Q breakfast  . sodium chloride flush  3 mL Intravenous Q12H  . spironolactone  12.5 mg Oral Daily   Continuous Infusions: . sodium chloride     PRN Meds: sodium chloride, acetaminophen, acetaminophen, diazepam, ipratropium-albuterol, ondansetron (ZOFRAN) IV, ondansetron **OR** [DISCONTINUED] ondansetron (ZOFRAN) IV, sodium chloride flush   Vital Signs    Vitals:   04/17/19 2033 04/17/19 2128 04/18/19 0033 04/18/19 0612  BP:  117/77 107/78 120/75  Pulse: 92 99  97  Resp: 16 18 20 18   Temp:  98.1 F (36.7 C) (!) 97.5 F (36.4 C) 97.9 F (36.6 C)  TempSrc:  Oral Oral Oral  SpO2: 94% 95% 92% 93%  Weight:    106.7 kg  Height:        Intake/Output Summary (Last 24 hours) at 04/18/2019 [id number] Last data filed at 04/18/2019 [id number] Gross per  24 hour  Intake 1940 ml  Output 3700 ml  Net -1760 ml   Filed Weights   04/16/19 0449 04/17/19 0053 04/18/19 0612  Weight: 106 kg 107.3 kg 106.7 kg    Telemetry    NSR with occasional PVC - Personally Reviewed  ECG    No new EKG to review- Personally Reviewed  Physical Exam   GEN: Well nourished, well developed in no acute distress HEENT: Normal NECK: No JVD; No carotid bruits LYMPHATICS: No lymphadenopathy CARDIAC:RRR, no murmurs, rubs, gallops RESPIRATORY:  Scattered rhonchi ABDOMEN: Soft, non-tender, non-distended MUSCULOSKELETAL:  No edema; No deformity  SKIN: Warm and dry NEUROLOGIC:  Alert and oriented x 3 PSYCHIATRIC:  Normal affect    Labs    Chemistry Recent Labs  Lab 04/16/19 0420  04/16/19 1535 04/17/19 0451 04/18/19 0505  NA 138   < > 123* 138 139  K 4.3   < > 3.6 4.0 3.2*  CL 99  --   --  99 99  CO2 28  --   --  27 28  GLUCOSE 207*  --   --  256* 136*  BUN 12  --   --  21 19  CREATININE 0.84  --   --  1.07 0.95  CALCIUM 9.6  --   --  9.2 9.0  GFRNONAA >60  --   --  >60 >60  GFRAA >60  --   --  >60 >60  ANIONGAP 11  --   --  12 12   < > = values in this interval not displayed.     Hematology Recent Labs  Lab 04/16/19 0420  04/16/19 1535 04/17/19 0451 04/18/19 0505  WBC 18.1*  --   --  19.5* 16.4*  RBC 4.98  --   --  4.86 4.98  HGB 14.9   < > 14.6 14.5 14.6  HCT 43.9   < > 43.0 43.8 44.4  MCV 88.2  --   --  90.1 89.2  MCH 29.9  --   --  29.8 29.3  MCHC 33.9  --   --  33.1 32.9  RDW 12.0  --   --  12.2 12.2  PLT 345  --   --  352 361   < > = values in this interval not displayed.    Cardiac EnzymesNo results for input(s): TROPONINI in the last 168 hours. No results for input(s): TROPIPOC in the last 168 hours.   BNP Recent Labs  Lab 04/14/19 1645  BNP 288.0*     DDimer No results for input(s): DDIMER in the last 168 hours.   Radiology    No results found.  Cardiac Studies   Cardiac Cath 04/16/2019 Conclusion     Prox LAD lesion is 55% stenosed.   Single-vessel coronary obstructive disease with smooth 50 to 60% proximal eccentric LAD stenosis; normal small ramus intermediate, left circumflex, and dominant RCA.  Mild to moderate right heart pressure elevation with moderate pulmonary hypertension with PA pressure 51/23, mean pressure 39.  LVEDP 25 - 31 mm Hg.  RECOMMENDATION: Guideline directed medical therapy for nonischemic cardiomyopathy.  Smoking cessation is essential.  Aggressive lipid-lowering therapy in attempt to induce plaque regression with target LDL less than 70 and preferably 50s or below.  Transition ACE-I to [facility] consider aldosterone blockade with spironolactone.    2D echo 04/15/2019 IMPRESSIONS    1. Left ventricular ejection fraction, by visual estimation, is 35 to 40%. The left ventricle has severely decreased function. There is no left ventricular hypertrophy. Global hypokinesis. Though not optimally visualized, there appears to be relatively  worse function of the lateral LV wall. Mildly dilated left ventricular internal cavity size.  2. Left ventricular diastolic parameters are indeterminate.  3. Global right ventricle has moderately reduced systolic function.The right ventricular size is normal. No increase in right ventricular wall thickness.  4. Left atrial size was normal.  5. Right atrial size was normal.  6. The mitral valve is normal in structure. Mild mitral valve regurgitation. No evidence of mitral stenosis.  7. The tricuspid valve is normal in structure. Tricuspid valve regurgitation is trivial.  8. The aortic valve is normal in structure. Aortic valve regurgitation is not visualized. Mild aortic valve sclerosis without stenosis.  9. The pulmonic valve was not well visualized. Pulmonic valve regurgitation is trivial. 10. The inferior vena cava is dilated in size with >50% respiratory variability, suggesting right atrial pressure of 8 mmHg. 11. Trivial  pericardial effusion is present.   Patient Profile     68 y.o. male with a hx of hypertension, DM2, untreated OSA and longstanding tobacco use who is being seen today for the evaluation of acute CHF exacerbation and presumed ischemic cardiomyopathy at the request of Dr.  Gonfa  Assessment & Plan    1. Acute CHF exacerbation: -Patient presented to an outpatient urgent care after a week of weakness, acute onset of respiratory symptoms including SOB with rales, rhonchi and SPO2 less than 90%. CXR concerning for CHF therefore was referred to the ED for further evaluation. -CTA on arrival to rule out PE was negative however again with concern for acute CHF>>noted to have coronary calcifications on imagining  -Echocardiogram performed 04/15/2019 with LVEF at 35 to 40% with global hypokinesis and left ventricular regional wall motion abnormalities -started on IV Lasix -he put out 3.5L yesterday and is net neg 2.8L  -weight decreased 8lbs from admit  -creatinine stable at 0.95 today -amlodipine stopped to allow adequate BP to titrate HF meds -change Losartan to Entresto 24-26mg  BID -increase Toprol XL to 25mg  daily for better HR control -continue spiro 12.5mg  daily -he appears euvolemic on exam.  Change lasix to 40mg  PO daily  2. Coronary atherosclerosis with reduced LV function: -No prior history of CAD however with coronary atherosclerosis on CTA and CRF's including HTN. DM2 and (46 year) tobacco use history  -Echocardiogram with reduced LV function at 35-40% and global  hypokinesis -cath this admit with 20% prox and 60% mid LAD stenosis - med management -continue ASA 81mg  daily and change Lopressor tartrate to succinate due to more B1 selectivity in setting of COPD -Atorvastatin increased to 80mg  daily -will need FLP and ALT in 6 weeks  3. COPD exacerbation: -Presented with worsening shortness of breath and productive cough with long, 55-pack-year history of tobacco abuse -Started on IV  Solu-Medrol, inhalers with antibiotics per primary team -COVID-19 negative -Further management per primary team  4. Uncontrolled DM2: -Hemoglobin A1c, 7.4 -Diagnose in 05/2018 -SSI for glucose control while inpatient status -Per primary team  5. Tobacco use: -55-year pack tobacco history -Smoking cessation strongly encouraged given high risk for CAD  6. HLD: -LDL, 101 from 04/16/2019 -Statin initiated per primary team and increased to 80mg  daily  7. HTN: -Stable, 131/96>123/91>125/82>122/83>126/90>>120/75mmHg -changing Losartan to Entresto 24-26mg  BID -stopped amlodipine to allow for adequate BP to titrate HF meds -increasing Toprol XL to 25mg  daily for better HR control  Patient adamant about going home today.  I think he is stable for discharge from cardiac standpoint.  I have spent a total of 35 minutes with patient reviewing notes , telemetry, EKGs, labs and examining patient as well as establishing an assessment and plan that was discussed with the patient.  > 50% of time was spent in direct patient care.      [facility] will sign off.   Medication Recommendations:  Entresto 24-26mg  BID, Toprol XL 25mg  daily, Spironolactone 12.5mg  daily, lasix 40mg  daily, ASA 81mg  daily, Atorvastatin 80mg  daily, Kdur 48meq daily Other recommendations (labs, testing, etc):  BMET Tuesday Follow up as an outpatient:  7-10 day TOC followup in our office     For questions or updates, please contact [place] Please consult [email or web address] for contact info under Cardiology/STEMI.      Signed, Fransico Him, MD  04/18/2019, 8:33 AM

## 2019-04-18 NOTE — Plan of Care (Signed)
  Problem: Increased Nutrient Needs (NI-5.1) Goal: Food and/or nutrient delivery Description: Individualized approach for food/nutrient provision. Outcome: Adequate for Discharge   

## 2019-04-18 NOTE — Care Management (Signed)
Patient provided with [facility] card, he verbalized understanding for use.

## 2019-04-19 ENCOUNTER — Encounter (HOSPITAL_COMMUNITY): Payer: Self-pay | Admitting: Cardiovascular Disease

## 2019-04-19 ENCOUNTER — Telehealth: Payer: Self-pay

## 2019-04-19 NOTE — Telephone Encounter (Signed)
I have made the 1st attempt to contact the patient or family member in charge, in order to follow up from recently being discharged from the hospital. I left a message on voicemail but I will make another attempt at a different time.  

## 2019-04-19 NOTE — Telephone Encounter (Signed)
Transition Care Management Follow-up Telephone Call  Date of discharge and from where: 04/18/2019  How have you been since you were released from the hospital? Patient states he is fine   Any questions or concerns? Patient denies questions or concerns   Items Reviewed  Did the pt receive and understand the discharge instructions provided? yes  Medications obtained and verified? yes  Any new allergies since your discharge? No   Dietary orders reviewed? no  Do you have support at home? Patient denied needing help  Other (ie: DME, Home Health, etc) N/a  Functional Questionnaire: (I = Independent and D = Dependent) ADL's: I w/ assist  Bathing/Dressing- I w/ assist   Meal Prep- I w/ meal prep  Eating- I  Maintaining continence- I  Transferring/Ambulation- I  Managing Meds- I   Follow up appointments reviewed:    PCP Hospital f/u appt confirmed? Yes confirmed this Friday 04/23/2019  Specialist Hospital f/u appt confirmed? No  Are transportation arrangements needed? No   If their condition worsens, is the pt aware to call  their PCP or go to the ED? yes  Was the patient provided with contact information for the PCP's office or ED? yes  Was the pt encouraged to call back with questions or concerns? yes

## 2019-04-20 NOTE — Telephone Encounter (Signed)
TOC calls have to be virtual visit or IN OFFICE. Telephone visits do not count for TOC call. If he can not do virtual visit needs to be seen in office.

## 2019-04-20 NOTE — Telephone Encounter (Signed)
Called patient and informed him of either needing to being virtual visit or coming in to office. He verbalized understanding and stated he will just come in for his appointment on Friday.

## 2019-04-20 NOTE — Telephone Encounter (Signed)
Tried calling patient back about appointment scheduled. No answer. LMOM for patient to return call.

## 2019-04-22 ENCOUNTER — Ambulatory Visit: Payer: Medicare Other | Admitting: Nurse Practitioner

## 2019-04-23 ENCOUNTER — Other Ambulatory Visit: Payer: Self-pay

## 2019-04-23 ENCOUNTER — Ambulatory Visit: Payer: Medicare Other | Admitting: Nurse Practitioner

## 2019-05-17 ENCOUNTER — Telehealth (INDEPENDENT_AMBULATORY_CARE_PROVIDER_SITE_OTHER): Payer: Medicare Other | Admitting: Nurse Practitioner

## 2019-05-17 ENCOUNTER — Other Ambulatory Visit: Payer: Self-pay

## 2019-05-17 ENCOUNTER — Encounter: Payer: Self-pay | Admitting: Nurse Practitioner

## 2019-05-17 DIAGNOSIS — I10 Essential (primary) hypertension: Secondary | ICD-10-CM

## 2019-05-17 DIAGNOSIS — Z8551 Personal history of malignant neoplasm of bladder: Secondary | ICD-10-CM

## 2019-05-17 DIAGNOSIS — J441 Chronic obstructive pulmonary disease with (acute) exacerbation: Secondary | ICD-10-CM

## 2019-05-17 DIAGNOSIS — E1165 Type 2 diabetes mellitus with hyperglycemia: Secondary | ICD-10-CM

## 2019-05-17 DIAGNOSIS — E782 Mixed hyperlipidemia: Secondary | ICD-10-CM

## 2019-05-17 DIAGNOSIS — I5021 Acute systolic (congestive) heart failure: Secondary | ICD-10-CM

## 2019-05-17 DIAGNOSIS — F17200 Nicotine dependence, unspecified, uncomplicated: Secondary | ICD-10-CM

## 2019-05-17 NOTE — Progress Notes (Signed)
This service is provided via telemedicine  No vital signs collected/recorded due to the encounter was a telemedicine visit.   Location of patient (ex: home, work):  Home  Patient consents to a telephone visit:  Yes  Location of the provider (ex: office, home): [facility], Office   Name of any referring provider:  N/A  Names of all persons participating in the telemedicine service and their role in the encounter: S.Chrae B/CMA, Sherrie Mustache, NP, and Patient   Time spent on call:  10 min with medical assistant     Careteam: Patient Care Team: Lauree Chandler, NP as PCP - General (Geriatric Medicine) Sueanne Margarita, MD as PCP - Cardiology (Cardiology) Melissa Montane, MD as Consulting Physician (Otolaryngology) [name], MD as Consulting Physician (Urology)  Advanced Directive information Does Patient Have a Medical Advance Directive?: No, Would patient like information on creating a medical advance directive?: Yes (MAU/Ambulatory/Procedural Areas - Information given)(Copy mailed to patient with AVS)  Allergies  Allergen Reactions  . Chantix [Varenicline Tartrate]     Aggression     Chief Complaint  Patient presents with  . Acute Visit    Shortness of breath. Telehealth/Mychart Video Visit   . Quality Metric Gaps    Discuss need for eye exam and MALB   . Immunizations    Patient not intrested in vaccine updates at this time      HPI: Patient is a 68 y.o. male via virtual visit for hospital follow up.   Reports he was supposed to limit his fluid Reports he has gained some weight due to eating more at [place].  He reports he does not use a lot of salt  Unable to check O2 level at home. Reports he quit smoking - in the last month has had 3 cigarettes   COPD exacerbation treated with zpak and the doxycycline, which he completed. Using anoro ellipta daily and as needed albuterol which he has not needed routinely- last used before hospital.    Not having any cough, does not need mucinex or tessalon.   CHF- Acute systolic CHF/NICM: Echo with LVEF of35 to 40%,RWMA,global hypokinesis and moderately reducedRVEF.his appt with cardiologist in next month.  Does not feel like he has been retaining fluid.  No swelling in legs. Continues with fluid and sodium restriction.  Shortness of breath at baseline if not a little better from smoking cessation. Lays slightly elevated at night due to hx of GERD, this is chronic and without needing to elevate head further.  Reports his wife keeps up with his medication, and he puts the pills in his pill box that he takes daily.  DM- metformin 1000 mg by mouth twice daily with glipizide twice daily. Reports he takes his blood sugars in the morning.  Blood sugars 95-120.   Left thyroid nodules- low TSH with mildly elevated t4,   Anxiety- reports he was anxious with being in hospital and wanted to be home. Reports this is better now.   Bladder cancer- ongoing urology follow up.   htn- has not check blood pressure recently.   Smoker- doing nicotine patch 21 mg- plans to step down but due to the years he smoked he plans on doing this slowly.   Review of Systems:  Review of Systems  Constitutional: Negative for chills, fever and weight loss.  Respiratory: Positive for shortness of breath (better than baseline since he has stopped smoking). Negative for cough and sputum production.   Cardiovascular: Positive for leg  swelling (trace). Negative for chest pain and palpitations.  Gastrointestinal: Negative for abdominal pain, constipation, diarrhea and heartburn.  Genitourinary: Negative for dysuria, frequency and urgency.  Musculoskeletal: Negative for back pain, joint pain and myalgias.  Skin: Negative.   Neurological: Negative for dizziness and headaches.  Psychiatric/Behavioral: Negative for depression and memory loss. The patient is not nervous/anxious and does not have insomnia.     Past  Medical History:  Diagnosis Date  . Arthritis   . Bladder cancer [facility]) urologist-  dr Kipp Brood  . Borderline diabetes   . Bursitis of both shoulders   . Diabetes mellitus without complication ([place])   . Disorder of male genital organs, unspecified   . Esophagus disorder    Constricted Esophagus, per New Patient Packet-PSC   . History of basal cell carcinoma (BCC) excision    05/ 2018  right forehead  . Hypertension   . Mild acid reflux no meds  . Nocturia   . OSA (obstructive sleep apnea)    per pt has not used cpap in years  . Productive cough    from smoking  . Renal cyst, left 11/2011   16 mm  . Sleep apnea    Per New Patient Packet-PSC   . Smokers' cough ([place])   . SOB (shortness of breath) on exertion    Past Surgical History:  Procedure Laterality Date  . CYSTOSCOPY WITH BIOPSY  03/13/2012   Procedure: CYSTOSCOPY WITH BIOPSY;  Surgeon: Fredricka Bonine, MD;  Location: [facility];  Service: Urology;  Laterality: N/A;     . ESOPHAGEAL DILATION  05/20/1986   Esophagus Stretch, per New Patient Packet-PSC   . RIGHT/LEFT HEART CATH AND CORONARY ANGIOGRAPHY N/A 04/16/2019   Procedure: RIGHT/LEFT HEART CATH AND CORONARY ANGIOGRAPHY;  Surgeon: Troy Sine, MD;  Location: [place] [facility];  Service: Cardiovascular;  Laterality: N/A;  . TONSILLECTOMY  age 29  . TRANSURETHRAL RESECTION OF BLADDER TUMOR N/A 05/23/2017   Procedure: TRANSURETHRAL RESECTION OF BLADDER TUMOR / (TURBT) WITH INSTILLATION OF EPIRUBICIN;  Surgeon: [name], MD;  Location[facility];  Service: Urology;  Laterality: N/A;  . UVULECTOMY  05/20/1990   Dr.Byers, per new patient packet   . UVULOPALATOPHARYNGOPLASTY  2005   osa   Social History:   reports that he has been smoking cigarettes. He has a 4.70 pack-year smoking history. He has quit using smokeless tobacco.  His smokeless tobacco use included chew and snuff. He reports current alcohol use. He reports  current drug use. Drug: Marijuana.  Family History  Problem Relation Age of Onset  . Diabetes Mother   . Dementia Mother   . Stroke Mother   . Arthritis Mother   . Hypertension Mother   . Multiple sclerosis Father   . Diabetes Father   . Diabetes Sister   . Hypertension Sister   . ADD / ADHD Son   . Migraines Daughter   . ADD / ADHD Daughter   . Cancer Maternal Grandmother     Medications: Patient's Medications  New Prescriptions   No medications on file  Previous Medications   ACETAMINOPHEN (TYLENOL) 325 MG TABLET    Take 650 mg by mouth every 6 (six) hours as needed for moderate pain.    ALBUTEROL (VENTOLIN HFA) 108 (90 BASE) MCG/ACT INHALER    Inhale 2 puffs into the lungs every 6 (six) hours as needed for wheezing or shortness of breath.   ASPIRIN 81 MG CHEWABLE TABLET  Chew 1 tablet (81 mg total) by mouth daily.   ATORVASTATIN (LIPITOR) 80 MG TABLET    Take 1 tablet (80 mg total) by mouth daily at 6 PM.   BACLOFEN 5 MG TABS    Take 5 mg by mouth daily.    BENZONATATE (TESSALON) 100 MG CAPSULE    Take 1 capsule (100 mg total) by mouth 3 (three) times daily.   DOXYCYCLINE (VIBRA-TABS) 100 MG TABLET    Take 1 tablet (100 mg total) by mouth every 12 (twelve) hours.   FUROSEMIDE (LASIX) 40 MG TABLET    Take 1 tablet (40 mg total) by mouth daily.   GLIPIZIDE (GLUCOTROL) 5 MG TABLET    Take 1 tablet (5 mg total) by mouth 2 (two) times daily.   GUAIFENESIN (MUCINEX) 600 MG 12 HR TABLET    Take 1 tablet (600 mg total) by mouth 2 (two) times daily.   METFORMIN (GLUCOPHAGE) 1000 MG TABLET    Take 1 tablet (1,000 mg total) by mouth 2 (two) times daily with a meal.   METOPROLOL SUCCINATE (TOPROL-XL) 25 MG 24 HR TABLET    Take 1 tablet (25 mg total) by mouth daily.   MULTIPLE VITAMIN (MULTIVITAMIN WITH MINERALS) TABS TABLET    Take 1 tablet by mouth daily.   NICOTINE (NICODERM CQ - DOSED IN MG/24 HOURS) 21 MG/24HR PATCH    Place 1 patch (21 mg total) onto the skin daily.   POTASSIUM  CHLORIDE SA (KLOR-CON) 20 MEQ TABLET    Take 2 tablets (40 mEq total) by mouth daily.   SACUBITRIL-VALSARTAN (ENTRESTO) 24-26 MG    Take 1 tablet by mouth 2 (two) times daily.   SPIRONOLACTONE (ALDACTONE) 25 MG TABLET    Take 0.5 tablets (12.5 mg total) by mouth daily.   UMECLIDINIUM-VILANTEROL (ANORO ELLIPTA) 62.5-25 MCG/INH AEPB    Inhale 1 puff into the lungs daily.  Modified Medications   No medications on file  Discontinued Medications   No medications on file    Physical Exam:  There were no vitals filed for this visit. There is no height or weight on file to calculate BMI. Wt Readings from Last 3 Encounters:  04/18/19 235 lb 3.2 oz (106.7 kg)  01/04/19 243 lb (110.2 kg)  12/21/18 246 lb (111.6 kg)      Labs reviewed: Basic Metabolic Panel: Recent Labs    04/15/19 0901 04/16/19 0420 04/16/19 1535 04/17/19 0451 04/18/19 0505  NA  --  138 123* 138 139  K  --  4.3 3.6 4.0 3.2*  CL  --  99  --  99 99  CO2  --  28  --  27 28  GLUCOSE  --  207*  --  256* 136*  BUN  --  12  --  21 19  CREATININE  --  0.84  --  1.07 0.95  CALCIUM  --  9.6  --  9.2 9.0  MG  --  2.2  --  2.2 2.2  TSH 0.303*  --   --   --   --    Liver Function Tests: Recent Labs    09/22/18 0805 01/04/19 0908  AST 13 13  ALT 19 22  BILITOT 1.1 1.5*  PROT 6.2 6.2   No results for input(s): LIPASE, AMYLASE in the last 8760 hours. No results for input(s): AMMONIA in the last 8760 hours. CBC: Recent Labs    09/22/18 0805 04/16/19 0420 04/16/19 1535 04/17/19 0451 04/18/19 0505  WBC 7.8  18.1*  --  19.5* 16.4*  NEUTROABS 4,438  --   --   --   --   HGB 15.9 14.9 14.6 14.5 14.6  HCT 46.8 43.9 43.0 43.8 44.4  MCV 89.1 88.2  --  90.1 89.2  PLT 274 345  --  352 361   Lipid Panel: Recent Labs    09/22/18 0805 04/16/19 0420  CHOL 163 156  HDL 43 45  LDLCALC 98 101*  TRIG 123 48  CHOLHDL 3.8 3.5   TSH: Recent Labs    04/15/19 0901  TSH 0.303*   A1C: Lab Results  Component Value  Date   HGBA1C 7.4 (H) 04/15/2019     Assessment/Plan 1. Type 2 diabetes mellitus with hyperglycemia, without long-term current use of insulin (HCC) Glipizide was added during hospitalization to metformin 1000 mg by mouth twice daily -pt reports blood sugars ranging between 90-120 at home. No hypoglycemia noted. Will continue current regimen  2. Essential hypertension -does not check blood pressure at home historically his blood pressure has been well controlled.  However it would be a good idea to get a blood pressure cuff for home monitoring due to htn and now with CHF. Will continue current regimen.  3. Hx of bladder cancer -ongoing follow up with urologist.   4. COPD exacerbation ([facility]) Improved, he has current quit smoking and on nicotine patch. Continues on anora   5. Smoker -using nicotine patch 21 mg daily, long time smoker and plans to do step down once craving start to decrease. Only used 3 cigarettes since discharge.  6. Acute systolic CHF (congestive heart failure) (HCC) Improved symptoms on lasix, entreso, aldactone, and toprol, pt reports he was unaware he was supposed to follow up and have lab work done after hospitalization. Educated the need to follow up kidney function and electrolytes with new medication, pt agreeable to come in office and have labs done in am.   7. Hyperlipidemia On lipitor, will follow up fasting lipids  Next appt: 3 months with labs prior (bmp, a1c) Ryonna Cimini K. Harle Battiest  [facility] [phone]   Virtual Visit via Video Note  I connected with [name] on 05/17/19 at 10:30 AM EST by a video enabled telemedicine application and verified that I am speaking with the correct person using two identifiers.  Location: Patient: home Provider: office   I discussed the limitations of evaluation and management by telemedicine and the availability of in person appointments. The patient expressed understanding and  agreed to proceed.    I discussed the assessment and treatment plan with the patient. The patient was provided an opportunity to ask questions and all were answered. The patient agreed with the plan and demonstrated an understanding of the instructions.   The patient was advised to call back or seek an in-person evaluation if the symptoms worsen or if the condition fails to improve as anticipated.  I provided 25 minutes of non-face-to-face time during this encounter.  Carlos American. Dewaine Oats, AGNP Avs printed and mailed.

## 2019-05-18 ENCOUNTER — Other Ambulatory Visit: Payer: Medicare Other

## 2019-05-18 ENCOUNTER — Other Ambulatory Visit: Payer: Self-pay

## 2019-05-18 DIAGNOSIS — I5021 Acute systolic (congestive) heart failure: Secondary | ICD-10-CM

## 2019-05-18 DIAGNOSIS — E782 Mixed hyperlipidemia: Secondary | ICD-10-CM

## 2019-05-19 LAB — COMPLETE METABOLIC PANEL WITH GFR
AG Ratio: 2.1 (calc) (ref 1.0–2.5)
ALT: 23 U/L (ref 9–46)
AST: 14 U/L (ref 10–35)
Albumin: 4.2 g/dL (ref 3.6–5.1)
Alkaline phosphatase (APISO): 40 U/L (ref 35–144)
BUN: 16 mg/dL (ref 7–25)
CO2: 32 mmol/L (ref 20–32)
Calcium: 10.1 mg/dL (ref 8.6–10.3)
Chloride: 102 mmol/L (ref 98–110)
Creat: 0.86 mg/dL (ref 0.70–1.25)
GFR, Est African American: 104 mL/min/{1.73_m2} (ref >=60)
GFR, Est Non African American: 90 mL/min/{1.73_m2} (ref >=60)
Globulin: 2 g/dL (calc) (ref 1.9–3.7)
Glucose, Bld: 151 mg/dL — ABNORMAL HIGH (ref 65–99)
Potassium: 4.4 mmol/L (ref 3.5–5.3)
Sodium: 139 mmol/L (ref 135–146)
Total Bilirubin: 0.9 mg/dL (ref 0.2–1.2)
Total Protein: 6.2 g/dL (ref 6.1–8.1)

## 2019-05-19 LAB — CBC WITH DIFFERENTIAL/PLATELET
Absolute Monocytes: 608 cells/uL (ref 200–950)
Basophils Absolute: 47 cells/uL (ref 0–200)
Basophils Relative: 0.6 %
Eosinophils Absolute: 727 cells/uL — ABNORMAL HIGH (ref 15–500)
Eosinophils Relative: 9.2 %
HCT: 44.5 % (ref 38.5–50.0)
Hemoglobin: 15.1 g/dL (ref 13.2–17.1)
Lymphs Abs: 1738 cells/uL (ref 850–3900)
MCH: 30.3 pg (ref 27.0–33.0)
MCHC: 33.9 g/dL (ref 32.0–36.0)
MCV: 89.4 fL (ref 80.0–100.0)
MPV: 10.1 fL (ref 7.5–12.5)
Monocytes Relative: 7.7 %
Neutro Abs: 4780 cells/uL (ref 1500–7800)
Neutrophils Relative %: 60.5 %
Platelets: 220 10*3/uL (ref 140–400)
RBC: 4.98 10*6/uL (ref 4.20–5.80)
RDW: 12.8 % (ref 11.0–15.0)
Total Lymphocyte: 22 %
WBC: 7.9 10*3/uL (ref 3.8–10.8)

## 2019-05-19 LAB — LIPID PANEL
Cholesterol: 97 mg/dL (ref <200)
HDL: 52 mg/dL (ref >=40)
LDL Cholesterol (Calc): 29 mg/dL (calc)
Non-HDL Cholesterol (Calc): 45 mg/dL (calc) (ref <130)
Total CHOL/HDL Ratio: 1.9 (calc) (ref <5.0)
Triglycerides: 82 mg/dL (ref <150)

## 2019-05-25 ENCOUNTER — Other Ambulatory Visit: Payer: Self-pay | Admitting: Nurse Practitioner

## 2019-05-25 DIAGNOSIS — E1165 Type 2 diabetes mellitus with hyperglycemia: Secondary | ICD-10-CM

## 2019-05-31 NOTE — Progress Notes (Signed)
Cardiology Office Note    Date:  06/01/2019   ID:  Timothy Lee, DOB [birth year], MRN [id number]  PCP:  Lauree Chandler, NP  Cardiologist:  Dr. Radford Pax   Chief Complaint: Hospital follow up  History of Present Illness:   Timothy Lee is a 69 y.o. male with a hx of hypertension, DM2, untreated OSA, COPD and longstanding tobacco use presented for hospital follow up.   Admitted 03/2019 for acute CHF and COPD exacerbation. Echocardiogram showed LVEF at 35 to 40% with global hypokinesis and left ventricular regional wall motion abnormalities. IV diuresed. Cath showed 20% prox and 60% mid LAD stenosis - med management. Titrated heart failure medication. High intensity statin was started.   Here today follow up. He has stopped smoking after discharge however restarted last week, currently smoking 3 cig/day. The patient denies nausea, vomiting, fever, chest pain, palpitations, shortness of breath, orthopnea, PND, dizziness, syncope, cough, congestion, abdominal pain, hematochezia, melena, lower extremity edema. Compliant with his medications. Uses low sodium diet.   Past Medical History:  Diagnosis Date  . Arthritis   . Bladder cancer [facility]) urologist-  dr Kipp Brood  . Borderline diabetes   . Bursitis of both shoulders   . Diabetes mellitus without complication ([place])   . Disorder of male genital organs, unspecified   . Esophagus disorder    Constricted Esophagus, per New Patient Packet-PSC   . History of basal cell carcinoma (BCC) excision    05/ 2018  right forehead  . Hypertension   . Mild acid reflux no meds  . Nocturia   . OSA (obstructive sleep apnea)    per pt has not used cpap in years  . Productive cough    from smoking  . Renal cyst, left 11/2011   16 mm  . Sleep apnea    Per New Patient Packet-PSC   . Smokers' cough ([place])   . SOB (shortness of breath) on exertion     Past Surgical History:  Procedure Laterality Date  . CYSTOSCOPY WITH BIOPSY  03/13/2012   Procedure: CYSTOSCOPY WITH BIOPSY;  Surgeon: Fredricka Bonine, MD;  Location: [facility];  Service: Urology;  Laterality: N/A;     . ESOPHAGEAL DILATION  05/20/1986   Esophagus Stretch, per New Patient Packet-PSC   . RIGHT/LEFT HEART CATH AND CORONARY ANGIOGRAPHY N/A 04/16/2019   Procedure: RIGHT/LEFT HEART CATH AND CORONARY ANGIOGRAPHY;  Surgeon: Troy Sine, MD;  Location: [place] [facility];  Service: Cardiovascular;  Laterality: N/A;  . TONSILLECTOMY  age 69  . TRANSURETHRAL RESECTION OF BLADDER TUMOR N/A 05/23/2017   Procedure: TRANSURETHRAL RESECTION OF BLADDER TUMOR / (TURBT) WITH INSTILLATION OF EPIRUBICIN;  Surgeon: [name], MD;  Location: [facility];  Service: Urology;  Laterality: N/A;  . UVULECTOMY  05/20/1990   Dr.Byers, per new patient packet   . UVULOPALATOPHARYNGOPLASTY  2005   osa    Current Medications: Prior to Admission medications   Medication Sig Start Date End Date Taking? Authorizing Provider  acetaminophen (TYLENOL) 325 MG tablet Take 650 mg by mouth every 6 (six) hours as needed for moderate pain.     [provider]  albuterol (VENTOLIN HFA) 108 (90 Base) MCG/ACT inhaler Inhale 2 puffs into the lungs every 6 (six) hours as needed for wheezing or shortness of breath. 04/09/19   Ngetich, Dinah C, NP  aspirin 81 MG chewable tablet Chew 1 tablet (81 mg total) by mouth daily. 04/19/19   Mercy Riding,  MD  atorvastatin (LIPITOR) 80 MG tablet Take 1 tablet (80 mg total) by mouth daily at 6 PM. 04/18/19   Mercy Riding, MD  Baclofen 5 MG TABS Take 5 mg by mouth daily.     [provider]  benzonatate (TESSALON) 100 MG capsule Take 1 capsule (100 mg total) by mouth 3 (three) times daily. 04/18/19   Mercy Riding, MD  doxycycline (VIBRA-TABS) 100 MG tablet Take 1 tablet (100 mg total) by mouth every 12 (twelve) hours. 04/18/19   Mercy Riding, MD  furosemide (LASIX) 40 MG tablet Take 1 tablet (40 mg total)  by mouth daily. 04/19/19   Mercy Riding, MD  glipiZIDE (GLUCOTROL) 5 MG tablet Take 1 tablet (5 mg total) by mouth 2 (two) times daily. 04/18/19 07/17/19  Mercy Riding, MD  guaiFENesin (MUCINEX) 600 MG 12 hr tablet Take 1 tablet (600 mg total) by mouth 2 (two) times daily. 04/18/19   Mercy Riding, MD  metFORMIN (GLUCOPHAGE) 1000 MG tablet TAKE (1) TABLET BY MOUTH TWICE A DAY WITH MEALS (BREAKFAST AND SUPPER). 05/25/19   Lauree Chandler, NP  metoprolol succinate (TOPROL-XL) 25 MG 24 hr tablet Take 1 tablet (25 mg total) by mouth daily. 04/19/19   Mercy Riding, MD  Multiple Vitamin (MULTIVITAMIN WITH MINERALS) TABS tablet Take 1 tablet by mouth daily. 04/19/19   Mercy Riding, MD  nicotine (NICODERM CQ - DOSED IN MG/24 HOURS) 21 mg/24hr patch Place 1 patch (21 mg total) onto the skin daily. 04/19/19   Mercy Riding, MD  potassium chloride SA (KLOR-CON) 20 MEQ tablet Take 2 tablets (40 mEq total) by mouth daily. 04/18/19   Mercy Riding, MD  sacubitril-valsartan (ENTRESTO) 24-26 MG Take 1 tablet by mouth 2 (two) times daily. 04/18/19   Mercy Riding, MD  spironolactone (ALDACTONE) 25 MG tablet Take 0.5 tablets (12.5 mg total) by mouth daily. 04/19/19   Mercy Riding, MD  umeclidinium-vilanterol (ANORO ELLIPTA) 62.5-25 MCG/INH AEPB Inhale 1 puff into the lungs daily. 04/18/19   Mercy Riding, MD    Allergies:   Chantix [varenicline tartrate]   Social History   Socioeconomic History  . Marital status: Married    Spouse name: Not on file  . Number of children: Not on file  . Years of education: Not on file  . Highest education level: Not on file  Occupational History  . Not on file  Tobacco Use  . Smoking status: Current Every Day Smoker    Packs/day: 0.10    Years: 47.00    Pack years: 4.70    Types: Cigarettes  . Smokeless tobacco: Former Systems developer    Types: Chew, Snuff  . Tobacco comment: cutting back almost quit   Substance and Sexual Activity  . Alcohol use: Yes    Comment: very  seldom- rare  . Drug use: Yes    Types: Marijuana    Comment: seldom-rare  . Sexual activity: Not Currently  Other Topics Concern  . Not on file  Social History Narrative   Diet: None      Caffeine: Yes      Married, if yes what year:  Yes, 1990      Do you live in a house, apartment, assisted living, condo, trailer, ect: House, 3 stories, 4 persons      Pets: 2 dogs, 2 cats       Highest level of education: Some Facilities manager profession:  Auctioneer       Exercise: Some         Living Will: Yes   DNR: Yes   POA/HPOA: Yes      Functional Status:   Do you have difficulty bathing or dressing yourself? No   Do you have difficulty preparing food or eating? No   Do you have difficulty managing your medications? No   Do you have difficulty managing your finances? No   Do you have difficulty affording your medications? No   Social Determinants of Health   Financial Resource Strain:   . Difficulty of Paying Living Expenses: Not on file  Food Insecurity:   . Worried About Charity fundraiser in the Last Year: Not on file  . Ran Out of Food in the Last Year: Not on file  Transportation Needs:   . Lack of Transportation (Medical): Not on file  . Lack of Transportation (Non-Medical): Not on file  Physical Activity:   . Days of Exercise per Week: Not on file  . Minutes of Exercise per Session: Not on file  Stress:   . Feeling of Stress : Not on file  Social Connections:   . Frequency of Communication with Friends and Family: Not on file  . Frequency of Social Gatherings with Friends and Family: Not on file  . Attends Religious Services: Not on file  . Active Member of Clubs or Organizations: Not on file  . Attends Archivist Meetings: Not on file  . Marital Status: Not on file     Family History:  The patient's family history includes ADD / ADHD in his daughter and son; Arthritis in his mother; Cancer in his maternal grandmother; Dementia in his  mother; Diabetes in his father, mother, and sister; Hypertension in his mother and sister; Migraines in his daughter; Multiple sclerosis in his father; Stroke in his mother.   ROS:   Please see the history of present illness.    ROS All other systems reviewed and are negative.   PHYSICAL EXAM:   VS:  BP 124/76   Pulse 98   Ht 6' (1.829 m)   Wt 250 lb 3.2 oz (113.5 kg)   SpO2 93%   BMI 33.93 kg/m    GEN: Well nourished, well developed, in no acute distress  HEENT: normal  Neck: no JVD, carotid bruits, or masses Cardiac: RRR; no murmurs, rubs, or gallops,no edema  Respiratory:  clear to auscultation bilaterally, normal work of breathing GI: soft, nontender, nondistended, + BS MS: no deformity or atrophy  Skin: warm and dry, no rash Neuro:  Alert and Oriented x 3, Strength and sensation are intact Psych: euthymic mood, full affect  Wt Readings from Last 3 Encounters:  06/01/19 250 lb 3.2 oz (113.5 kg)  04/18/19 235 lb 3.2 oz (106.7 kg)  01/04/19 243 lb (110.2 kg)      Studies/Labs Reviewed:   EKG:  EKG is not ordered today.   Recent Labs: 04/14/2019: B Natriuretic Peptide 288.0 04/15/2019: TSH 0.303 04/18/2019: Magnesium 2.2 05/18/2019: ALT 23; BUN 16; Creat 0.86; Hemoglobin 15.1; Platelets 220; Potassium 4.4; Sodium 139   Lipid Panel    Component Value Date/Time   CHOL 97 05/18/2019 0825   TRIG 82 05/18/2019 0825   HDL 52 05/18/2019 0825   CHOLHDL 1.9 05/18/2019 0825   VLDL 10 04/16/2019 0420   LDLCALC 29 05/18/2019 0825    Additional studies/ records that were reviewed today include:   Echocardiogram: 04/15/19 1. Left  ventricular ejection fraction, by visual estimation, is 35 to 40%. The left ventricle has severely decreased function. There is no left ventricular hypertrophy. Global hypokinesis. Though not optimally visualized, there appears to be relatively  worse function of the lateral LV wall. Mildly dilated left ventricular internal cavity size.  2. Left  ventricular diastolic parameters are indeterminate.  3. Global right ventricle has moderately reduced systolic function.The right ventricular size is normal. No increase in right ventricular wall thickness.  4. Left atrial size was normal.  5. Right atrial size was normal.  6. The mitral valve is normal in structure. Mild mitral valve regurgitation. No evidence of mitral stenosis.  7. The tricuspid valve is normal in structure. Tricuspid valve regurgitation is trivial.  8. The aortic valve is normal in structure. Aortic valve regurgitation is not visualized. Mild aortic valve sclerosis without stenosis.  9. The pulmonic valve was not well visualized. Pulmonic valve regurgitation is trivial. 10. The inferior vena cava is dilated in size with >50% respiratory variability, suggesting right atrial pressure of 8 mmHg. 11. Trivial pericardial effusion is present.  RIGHT/LEFT HEART CATH AND CORONARY ANGIOGRAPHY  04/16/19  Conclusion    Prox LAD lesion is 55% stenosed.   Single-vessel coronary obstructive disease with smooth 50 to 60% proximal eccentric LAD stenosis; normal small ramus intermediate, left circumflex, and dominant RCA.  Mild to moderate right heart pressure elevation with moderate pulmonary hypertension with PA pressure 51/23, mean pressure 39.  LVEDP 25 - 31 mm Hg.  RECOMMENDATION: Guideline directed medical therapy for nonischemic cardiomyopathy.  Smoking cessation is essential.  Aggressive lipid-lowering therapy in attempt to induce plaque regression with target LDL less than 70 and preferably 50s or below.  Transition ACE-I to [facility] consider aldosterone blockade with spironolactone.       ASSESSMENT & PLAN:    1. CAD  -Cath 03/2019 with 20% prox and 60% mid LAD stenosis - med management. No angina. Continue ASA, statin and BB.   2. Chronic systolic CHF - LVEF of 123456. Euvolemic. Discussed heart failure in detail. He is using low sodium diet. Continue  Entresto, spironolactone and lasix. Increase Torpol to 50mg  qd.   3. HTN - BP stable. Increase BB as above.   4. HLD - 04/16/2019: VLDL 10 05/18/2019: Cholesterol 97; HDL 52; LDL Cholesterol (Calc) 29; Triglycerides 82  - Continue statin   5. Tobacco abuse - relapsed last week. Trying to quit again. Education given.   6. OSA - did not tolerate CPAP in past.    7. COPD - no active wheezing    Medication Adjustments/Labs and Tests Ordered: Current medicines are reviewed at length with the patient today.  Concerns regarding medicines are outlined above.  Medication changes, Labs and Tests ordered today are listed in the Patient Instructions below. Patient Instructions  Medication Instructions:    START TAKING TOPROL XL 50 MG ONCE A DAY   *If you need a refill on your cardiac medications before your next appointment, please call your pharmacy*  Lab Work: NONE ORDERED  TODAY'  If you have labs (blood work) drawn today and your tests are completely normal, you will receive your results only by: Marland Kitchen MyChart Message (if you have MyChart) OR . A paper copy in the mail If you have any lab test that is abnormal or we need to change your treatment, we will call you to review the results.  Testing/Procedures: NONE ORDERED  TODAY    Follow-Up: At [facility], you and your health  needs are our priority.  As part of our continuing mission to provide you with exceptional heart care, we have created designated Provider Care Teams.  These Care Teams include your primary Cardiologist (physician) and Advanced Practice Providers (APPs -  Physician Assistants and Nurse Practitioners) who all work together to provide you with the care you need, when you need it.  Your next appointment:   3-4  month(s)  The format for your next appointment:   In Person  Provider:   You may see Fransico Him, MD or one of the following Advanced Practice Providers on your designated Care Team:    Melina Copa, PA-C  Ermalinda Barrios, PA-C   Other Instructions     Signed, Leanor Kail, [place]  06/01/2019 10:17 AM    [address], [place], [place]  [address] Phone: [phone]; Fax: [phone]

## 2019-06-01 ENCOUNTER — Ambulatory Visit (INDEPENDENT_AMBULATORY_CARE_PROVIDER_SITE_OTHER): Payer: Medicare Other | Admitting: Physician Assistant

## 2019-06-01 ENCOUNTER — Other Ambulatory Visit: Payer: Self-pay

## 2019-06-01 ENCOUNTER — Encounter: Payer: Self-pay | Admitting: Physician Assistant

## 2019-06-01 VITALS — BP 124/76 | HR 98 | Ht 72.0 in | Wt 250.2 lb

## 2019-06-01 DIAGNOSIS — Z7189 Other specified counseling: Secondary | ICD-10-CM

## 2019-06-01 DIAGNOSIS — I10 Essential (primary) hypertension: Secondary | ICD-10-CM | POA: Diagnosis not present

## 2019-06-01 DIAGNOSIS — I2510 Atherosclerotic heart disease of native coronary artery without angina pectoris: Secondary | ICD-10-CM

## 2019-06-01 DIAGNOSIS — G4733 Obstructive sleep apnea (adult) (pediatric): Secondary | ICD-10-CM

## 2019-06-01 DIAGNOSIS — I5022 Chronic systolic (congestive) heart failure: Secondary | ICD-10-CM

## 2019-06-01 DIAGNOSIS — E785 Hyperlipidemia, unspecified: Secondary | ICD-10-CM

## 2019-06-01 DIAGNOSIS — Z720 Tobacco use: Secondary | ICD-10-CM

## 2019-06-01 MED ORDER — METOPROLOL SUCCINATE ER 50 MG PO TB24: 50.0000 mg | ORAL_TABLET | Freq: Every day | ORAL | 1 refills | Status: DC

## 2019-06-01 NOTE — Patient Instructions (Signed)
Medication Instructions:    START TAKING TOPROL XL 50 MG ONCE A DAY   *If you need a refill on your cardiac medications before your next appointment, please call your pharmacy*  Lab Work: NONE ORDERED  TODAY'  If you have labs (blood work) drawn today and your tests are completely normal, you will receive your results only by: Marland Kitchen MyChart Message (if you have MyChart) OR . A paper copy in the mail If you have any lab test that is abnormal or we need to change your treatment, we will call you to review the results.  Testing/Procedures: NONE ORDERED  TODAY    Follow-Up: At [facility], you and your health needs are our priority.  As part of our continuing mission to provide you with exceptional heart care, we have created designated Provider Care Teams.  These Care Teams include your primary Cardiologist (physician) and Advanced Practice Providers (APPs -  Physician Assistants and Nurse Practitioners) who all work together to provide you with the care you need, when you need it.  Your next appointment:   3-4  month(s)  The format for your next appointment:   In Person  Provider:   You may see Fransico Him, MD or one of the following Advanced Practice Providers on your designated Care Team:    Melina Copa, PA-C  Ermalinda Barrios, PA-C   Other Instructions

## 2019-06-04 ENCOUNTER — Other Ambulatory Visit: Payer: Self-pay

## 2019-06-04 DIAGNOSIS — E1165 Type 2 diabetes mellitus with hyperglycemia: Secondary | ICD-10-CM

## 2019-06-04 DIAGNOSIS — E782 Mixed hyperlipidemia: Secondary | ICD-10-CM

## 2019-06-04 DIAGNOSIS — F17200 Nicotine dependence, unspecified, uncomplicated: Secondary | ICD-10-CM

## 2019-06-04 DIAGNOSIS — I10 Essential (primary) hypertension: Secondary | ICD-10-CM

## 2019-06-22 ENCOUNTER — Encounter: Payer: Self-pay | Admitting: Nurse Practitioner

## 2019-07-16 ENCOUNTER — Other Ambulatory Visit: Payer: Self-pay | Admitting: Family

## 2019-07-16 NOTE — Telephone Encounter (Signed)
What dose should pt be on?

## 2019-08-11 ENCOUNTER — Other Ambulatory Visit: Payer: Self-pay

## 2019-08-11 ENCOUNTER — Other Ambulatory Visit: Payer: Medicare Other

## 2019-08-11 DIAGNOSIS — F17200 Nicotine dependence, unspecified, uncomplicated: Secondary | ICD-10-CM

## 2019-08-11 DIAGNOSIS — I10 Essential (primary) hypertension: Secondary | ICD-10-CM | POA: Diagnosis not present

## 2019-08-11 DIAGNOSIS — E1165 Type 2 diabetes mellitus with hyperglycemia: Secondary | ICD-10-CM

## 2019-08-11 DIAGNOSIS — E782 Mixed hyperlipidemia: Secondary | ICD-10-CM

## 2019-08-12 LAB — BASIC METABOLIC PANEL WITH GFR
BUN: 14 mg/dL (ref 7–25)
CO2: 27 mmol/L (ref 20–32)
Calcium: 9.6 mg/dL (ref 8.6–10.3)
Chloride: 105 mmol/L (ref 98–110)
Creat: 0.95 mg/dL (ref 0.70–1.25)
GFR, Est African American: 95 mL/min/{1.73_m2} (ref >=60)
GFR, Est Non African American: 82 mL/min/{1.73_m2} (ref >=60)
Glucose, Bld: 142 mg/dL — ABNORMAL HIGH (ref 65–99)
Potassium: 4.4 mmol/L (ref 3.5–5.3)
Sodium: 141 mmol/L (ref 135–146)

## 2019-08-12 LAB — HEMOGLOBIN A1C
Hgb A1c MFr Bld: 6.6 % of total Hgb — ABNORMAL HIGH (ref <5.7)
Mean Plasma Glucose: 143 (calc)
eAG (mmol/L): 7.9 (calc)

## 2019-08-13 ENCOUNTER — Ambulatory Visit (INDEPENDENT_AMBULATORY_CARE_PROVIDER_SITE_OTHER): Payer: Medicare Other

## 2019-08-13 ENCOUNTER — Encounter (HOSPITAL_COMMUNITY): Payer: Self-pay

## 2019-08-13 ENCOUNTER — Ambulatory Visit (HOSPITAL_COMMUNITY)
Admission: EM | Admit: 2019-08-13 | Discharge: 2019-08-13 | Disposition: A | Discharge status: Home / Self Care | Payer: Medicare Other | Attending: Physician Assistant | Admitting: Physician Assistant

## 2019-08-13 ENCOUNTER — Other Ambulatory Visit: Payer: Self-pay

## 2019-08-13 ENCOUNTER — Encounter: Payer: Self-pay | Admitting: *Deleted

## 2019-08-13 DIAGNOSIS — M25511 Pain in right shoulder: Secondary | ICD-10-CM | POA: Diagnosis not present

## 2019-08-13 DIAGNOSIS — M549 Dorsalgia, unspecified: Secondary | ICD-10-CM | POA: Diagnosis not present

## 2019-08-13 DIAGNOSIS — S4991XA Unspecified injury of right shoulder and upper arm, initial encounter: Secondary | ICD-10-CM | POA: Diagnosis not present

## 2019-08-13 DIAGNOSIS — Z043 Encounter for examination and observation following other accident: Secondary | ICD-10-CM

## 2019-08-13 MED ORDER — PREDNISONE 10 MG PO TABS: 20.0000 mg | ORAL_TABLET | Freq: Every day | ORAL | 0 refills | Status: AC

## 2019-08-13 MED ORDER — CYCLOBENZAPRINE HCL 10 MG PO TABS: 5.0000 mg | ORAL_TABLET | Freq: Two times a day (BID) | ORAL | 0 refills | Status: DC | PRN

## 2019-08-13 MED ORDER — ACETAMINOPHEN 500 MG PO TABS: 1000.0000 mg | ORAL_TABLET | Freq: Three times a day (TID) | ORAL | 0 refills | Status: AC | PRN

## 2019-08-13 MED ORDER — ACETAMINOPHEN 500 MG PO TABS: 500.0000 mg | ORAL_TABLET | Freq: Four times a day (QID) | ORAL | 0 refills | Status: DC | PRN

## 2019-08-13 NOTE — ED Triage Notes (Signed)
Pt states he fell from trailer while loading refrigerator yesterday. Pt states fell approx 20 inches, landing on right side of body, striking head on ground, and refrigerator struck his left leg. Denies LOC or head pain.   C/o right scapula pain. Pt holding right arm in "sling" position, but able to extend arm when talking/making gestures. Denies numbness/tingling to right hand. Reports h/o "bursitis" to right shoulder.  Describes a "bumping" feeling to posterior right shoulder/upper scapula region when rotating arm. Reports taking vicodin and advil last night for pain.

## 2019-08-13 NOTE — Discharge Instructions (Addendum)
Your x-ray was negative.  I believe this is related to muscle pain and that you will have best improvement with this regiment -short course of steroid therapy, 20mg  prednisone for 3 days - 2 extra strength Tylenol every 8 hours.   - And using 1/2- 1 tablet of the muscle relaxer at night   If you continue to have pain please follow up with your primary care at your next appointment

## 2019-08-13 NOTE — ED Provider Notes (Signed)
[place]    CSN: [id number] Arrival date & time: 08/13/19  [id number]      History   Chief Complaint No chief complaint on file.   HPI Timothy Lee is a 69 y.o. male.   Patient presents for evaluation in the meantime of right scapular pain after a fall yesterday.  He reports he was attempting to load a refrigerator onto a trailer when his feet got tripped up and he fell backwards onto his right side off his trailer.  The height of the trailer was approximately 20 inches.  He reports bumping his head slightly however denies injury, pain or headache.  Ports that he had left front of his head and points to his forehead on the right side.  Denies loss of consciousness.  He reports falling onto the  right side of his back with emphasis on the upper part of his back.Marland Kitchen  He did not note a lot of pain immediately after the injury but as the day progressed he had worsening right-sided upper back and scapular pain.  Denies anterior or lateral shoulder pain or right arm pain.  Does report that range of motion of the shoulder does cause pain in the right scapular region.  Describes his pain is sharp at times.  It was so severe last night that he took a Vicodin and ibuprofen and this was noted when he was able to sleep.  He denies previous injuries to his shoulders however does note a history of bursitis in his shoulders.  Denies any numbness, tingling or weakness in the right arm.  Currently is denying any shortness of breath.  He does have a little bit of increased pain with deep inspiration.  Denies chest pain.  With regard to the reason for the fall he is adamant that he tripped over his feet on the side of the trailer and did not get lightheaded or dizzy.  He does note some other injuries as the refrigerator he did fall down onto his left and right leg causing some abrasions.  He does not wish to have these evaluated as he says these are fine.  Denies leg pain or issues walking.  Patient is  not on any blood thinners and there was no excessive bleeding.     Past Medical History:  Diagnosis Date  . Arthritis   . Bladder cancer [facility]) urologist-  dr Kipp Brood  . Borderline diabetes   . Bursitis of both shoulders   . Diabetes mellitus without complication ([place])   . Disorder of male genital organs, unspecified   . Esophagus disorder    Constricted Esophagus, per New Patient Packet-PSC   . History of basal cell carcinoma (BCC) excision    05/ 2018  right forehead  . Hypertension   . Mild acid reflux no meds  . Nocturia   . OSA (obstructive sleep apnea)    per pt has not used cpap in years  . Productive cough    from smoking  . Renal cyst, left 11/2011   16 mm  . Sleep apnea    Per New Patient Packet-PSC   . Smokers' cough (Timothy Lee)   . SOB (shortness of breath) on exertion     Patient Active Problem List   Diagnosis Date Noted  . SOB (shortness of breath)   . Acute respiratory failure ([place]) 04/15/2019  . Acute respiratory failure with hypoxia ([place]) 04/14/2019  . Type 2 diabetes mellitus with hyperglycemia, without long-term current use of insulin ([facility])  09/16/2017  . Essential hypertension 08/19/2017  . Acute bronchitis 08/19/2017  . OSA (obstructive sleep apnea) 08/19/2017  . Hx of bladder cancer 08/19/2017  . Smoker 08/19/2017    Past Surgical History:  Procedure Laterality Date  . CYSTOSCOPY WITH BIOPSY  03/13/2012   Procedure: CYSTOSCOPY WITH BIOPSY;  Surgeon: Timothy Bonine, MD;  Location: [facility];  Service: Urology;  Laterality: N/A;     . ESOPHAGEAL DILATION  05/20/1986   Esophagus Stretch, per New Patient Packet-PSC   . RIGHT/LEFT HEART CATH AND CORONARY ANGIOGRAPHY N/A 04/16/2019   Procedure: RIGHT/LEFT HEART CATH AND CORONARY ANGIOGRAPHY;  Surgeon: Timothy Sine, MD;  Location: [place] [facility];  Service: Cardiovascular;  Laterality: N/A;  . TONSILLECTOMY  age 3  . TRANSURETHRAL RESECTION OF BLADDER TUMOR N/A 05/23/2017     Procedure: TRANSURETHRAL RESECTION OF BLADDER TUMOR / (TURBT) WITH INSTILLATION OF EPIRUBICIN;  Surgeon: Timothy Aloe, MD;  Location: [facility];  Service: Urology;  Laterality: N/A;  . UVULECTOMY  05/20/1990   Timothy Lee, per new patient packet   . UVULOPALATOPHARYNGOPLASTY  2005   osa       Home Medications    Prior to Admission medications   Medication Sig Start Date End Date Taking? Authorizing Provider  baclofen (LIORESAL) 10 MG tablet TAKE 1/2 TABLET BY MOUTH 3 TIMES DAILY. 07/16/19   Timothy Chandler, NP  acetaminophen (TYLENOL) 500 MG tablet Take 2 tablets (1,000 mg total) by mouth every 8 (eight) hours as needed for moderate pain. 08/13/19   Timothy Lee, Timothy Beards, PA-Lee  albuterol (VENTOLIN HFA) 108 (90 Base) MCG/ACT inhaler Inhale 2 puffs into the lungs every 6 (six) hours as needed for wheezing or shortness of breath. 04/09/19   Ngetich, Timothy C, NP  aspirin 81 MG chewable tablet Chew 1 tablet (81 mg total) by mouth daily. 04/19/19   Timothy Riding, MD  atorvastatin (LIPITOR) 80 MG tablet Take 1 tablet (80 mg total) by mouth daily at 6 PM. 04/18/19   Gonfa, Timothy Ivory, MD  benzonatate (TESSALON) 100 MG capsule Take 1 capsule (100 mg total) by mouth 3 (three) times daily. 04/18/19   Timothy Riding, MD  cyclobenzaprine (FLEXERIL) 10 MG tablet Take 0.5 tablets (5 mg total) by mouth 2 (two) times daily as needed for muscle spasms. 08/13/19   Timothy Lee, Timothy Beards, PA-Lee  doxycycline (VIBRA-TABS) 100 MG tablet Take 1 tablet (100 mg total) by mouth every 12 (twelve) hours. 04/18/19   Timothy Riding, MD  furosemide (LASIX) 40 MG tablet Take 1 tablet (40 mg total) by mouth daily. 04/19/19   Timothy Riding, MD  glipiZIDE (GLUCOTROL) 5 MG tablet Take 1 tablet (5 mg total) by mouth 2 (two) times daily. 04/18/19 07/17/19  Timothy Riding, MD  guaiFENesin (MUCINEX) 600 MG 12 hr tablet Take 1 tablet (600 mg total) by mouth 2 (two) times daily. 04/18/19   Timothy Riding, MD  metFORMIN (GLUCOPHAGE) 1000  MG tablet TAKE (1) TABLET BY MOUTH TWICE A DAY WITH MEALS (BREAKFAST AND SUPPER). 05/25/19   Timothy Chandler, NP  metoprolol succinate (TOPROL-XL) 50 MG 24 hr tablet Take 1 tablet (50 mg total) by mouth daily. 06/01/19   Leanor Kail, PA  Multiple Vitamin (MULTIVITAMIN WITH MINERALS) TABS tablet Take 1 tablet by mouth daily. 04/19/19   Timothy Riding, MD  nicotine (NICODERM CQ - DOSED IN MG/24 HOURS) 21 mg/24hr patch Place 1 patch (21 mg total) onto the skin daily.  04/19/19   Timothy Riding, MD  potassium chloride SA (KLOR-CON) 20 MEQ tablet Take 2 tablets (40 mEq total) by mouth daily. 04/18/19   Timothy Riding, MD  predniSONE (DELTASONE) 10 MG tablet Take 2 tablets (20 mg total) by mouth daily for 3 days. 08/13/19 08/16/19  Derica Leiber, Timothy Beards, PA-Lee  sacubitril-valsartan (ENTRESTO) 24-26 MG Take 1 tablet by mouth 2 (two) times daily. 04/18/19   Timothy Riding, MD  spironolactone (ALDACTONE) 25 MG tablet Take 0.5 tablets (12.5 mg total) by mouth daily. 04/19/19   Timothy Riding, MD  umeclidinium-vilanterol (ANORO ELLIPTA) 62.5-25 MCG/INH AEPB Inhale 1 puff into the lungs daily. 04/18/19   Timothy Riding, MD    Family History Family History  Problem Relation Age of Onset  . Diabetes Mother   . Dementia Mother   . Stroke Mother   . Arthritis Mother   . Hypertension Mother   . Multiple sclerosis Father   . Diabetes Father   . Diabetes Sister   . Hypertension Sister   . ADD / ADHD Son   . Migraines Daughter   . ADD / ADHD Daughter   . Cancer Maternal Grandmother     Social History Social History   Tobacco Use  . Smoking status: Current Every Day Smoker    Packs/day: 0.10    Years: 47.00    Pack years: 4.70    Types: Cigarettes  . Smokeless tobacco: Former Systems developer    Types: Chew, Snuff  . Tobacco comment: cutting back almost quit   Substance Use Topics  . Alcohol use: Yes    Comment: very seldom- rare  . Drug use: Yes    Types: Marijuana    Comment: seldom-rare     Allergies    Chantix [varenicline tartrate]   Review of Systems Review of Systems  Constitutional: Negative for chills and fever.  Respiratory: Negative for cough, chest tightness and shortness of breath.   Cardiovascular: Negative for chest pain and palpitations.  Gastrointestinal: Negative for abdominal pain.  Musculoskeletal: Positive for back pain. Negative for arthralgias, myalgias, neck pain and neck stiffness.  Skin: Positive for wound. Negative for color change.  Neurological: Negative for dizziness, weakness, light-headedness, numbness and headaches.     Physical Exam Triage Vital Signs ED Triage Vitals  Enc Vitals Group     BP 08/13/19 0910 (!) 151/82     Pulse Rate 08/13/19 0910 92     Resp 08/13/19 0910 20     Temp 08/13/19 0910 98.1 F (36.7 Lee)     Temp Source 08/13/19 0910 Oral     SpO2 08/13/19 0910 98 %     Weight --      Height --      Head Circumference --      Peak Flow --      Pain Score 08/13/19 0907 8     Pain Loc --      Pain Edu? --      Excl. in [place]? --    No data found.  Updated Vital Signs BP (!) 151/82 (BP Location: Left Arm)   Pulse 92   Temp 98.1 F (36.7 Lee) (Oral)   Resp 20   SpO2 98%   Visual Acuity Right Eye Distance:   Left Eye Distance:   Bilateral Distance:    Right Eye Near:   Left Eye Near:    Bilateral Near:     Physical Exam Vitals and nursing note reviewed.  Constitutional:  General: He is not in acute distress.    Appearance: He is well-developed. He is not ill-appearing.     Comments: Well-developed well-nourished male appearing his stated age seated on the exam table with his right arm in the sling supported position.  However not in any distress.  He does move the right arm freely when giving history without hesitation.  There are episodes of reported pain during the interview and exam with certain movements.  HENT:     Head: Normocephalic and atraumatic.     Comments: There are no evidence of hematoma or abrasion to  any part of the head or face. Eyes:     General: No scleral icterus.    Extraocular Movements: Extraocular movements intact.     Conjunctiva/sclera: Conjunctivae normal.     Pupils: Pupils are equal, round, and reactive to light.  Cardiovascular:     Rate and Rhythm: Normal rate and regular rhythm.     Heart sounds: No murmur.  Pulmonary:     Effort: Pulmonary effort is normal. No respiratory distress.     Breath sounds: Normal breath sounds.  Musculoskeletal:     Cervical back: Normal range of motion and neck supple. No rigidity or tenderness.     Comments: There are no gross deformities of the right shoulder.  Patient has tenderness on the medial aspect of the right scapula and into the upper thoracic musculature.  There is no midline spinal tenderness.  There is no anterior lateral or posterior humeral tenderness.  There is no AC tenderness.  There is no clavicular tenderness.    Patient does have full range of motion of the right shoulder with flexion, extension, abduction and abduction.  He does have reported pain with terminal flexion and abduction overhead position.  There is some reported pain with resisted empty can test the right side.  Skin:    General: Skin is warm and dry.     Comments: Bandaged wounds on both lower extremities.  There is no bleedthrough of the bandaging.  Patient defers evaluation of these wounds.  No discoloration or ecchymosis noted over the right scapula or shoulder.  There is no swelling.  Neurological:     General: No focal deficit present.     Mental Status: He is alert and oriented to person, place, and time.     Cranial Nerves: No cranial nerve deficit.     Comments: Patient is ambulatory with fluid gait.  Cranial nerves grossly intact.  5/5 strength throughout upper extremities bilaterally.  Sensation grossly intact in the upper extremities.      UC Treatments / Results  Labs (all labs ordered are listed, but only abnormal results are  displayed) Labs Reviewed - No data to display  EKG   Radiology DG Scapula Right  Result Date: 08/13/2019 CLINICAL DATA:  [place] on right side, worsening right scapular pain EXAM: RIGHT SCAPULA - 2+ VIEWS COMPARISON:  None. FINDINGS: Frontal and transscapular views of the right shoulder are obtained. No fracture, subluxation, or dislocation. Hypertrophic changes are seen of the acromioclavicular joint. Mild glenohumeral joint space narrowing. Right chest is clear. IMPRESSION: 1. Osteoarthritis.  No acute fracture. Electronically Signed   By: Randa Ngo [name]   On: 08/13/2019 09:49    Procedures Procedures (including critical care time)  Medications Ordered in UC Medications - No data to display  Initial Impression / Assessment and Plan / UC Course  I have reviewed the triage vital signs and the nursing notes.  Pertinent labs & imaging results that were available during my care of the patient were reviewed by me and considered in my medical decision making (see chart for details).     #Fall #Upper back pain Patient 69 year old presenting for evaluation after a fall yesterday with reported upper back pain.  X-ray was negative for scapular or rib injury.  Believe this to be muscular pain with spasm.  Patient has no signs of head injury at this time and no concern for concussion.  Given patient does have cardiac history we will avoid NSAID therapy.  He is a type II diabetic but had a recent A1c at 6.6, so feel okay for 3-day 20 mg daily prednisone course for inflammation.  Will place on Flexeril one half tab to 1 tab nightly.  Recommended no NSAID therapy at home and to use Tylenol.  Patient does have follow-up with primary care on 08/16/2019.  Instructed that if pain is still persistent then to discuss further with his primary.  Patient verbalized understanding the plan and agrees. Final Clinical Impressions(s) / UC Diagnoses   Final diagnoses:  Acute upper back pain  Encounter for  examination following a fall     Discharge Instructions     Your x-ray was negative.  I believe this is related to muscle pain and that you will have best improvement with this regiment -short course of steroid therapy, 20mg  prednisone for 3 days - 2 extra strength Tylenol every 8 hours.   - And using 1/2- 1 tablet of the muscle relaxer at night   If you continue to have pain please follow up with your primary care at your next appointment      ED Prescriptions    Medication Sig Dispense Auth. Provider   acetaminophen (TYLENOL) 500 MG tablet  (Status: Discontinued) Take 1 tablet (500 mg total) by mouth every 6 (six) hours as needed. 30 tablet Special Ranes, Timothy Beards, PA-Lee   cyclobenzaprine (FLEXERIL) 10 MG tablet Take 0.5 tablets (5 mg total) by mouth 2 (two) times daily as needed for muscle spasms. 20 tablet Mehlani Blankenburg, Timothy Beards, PA-Lee   predniSONE (DELTASONE) 10 MG tablet Take 2 tablets (20 mg total) by mouth daily for 3 days. 6 tablet Erianna Jolly, Timothy Beards, PA-Lee   acetaminophen (TYLENOL) 500 MG tablet Take 2 tablets (1,000 mg total) by mouth every 8 (eight) hours as needed for moderate pain. 30 tablet Asuncion Shibata, Timothy Beards, PA-Lee     I have reviewed the PDMP during this encounter.   Purnell Shoemaker, PA-Lee 08/14/19 1236

## 2019-08-16 ENCOUNTER — Ambulatory Visit: Payer: Medicare Other | Admitting: Nurse Practitioner

## 2019-08-25 ENCOUNTER — Encounter: Payer: Self-pay | Admitting: Nurse Practitioner

## 2019-08-25 ENCOUNTER — Other Ambulatory Visit: Payer: Self-pay

## 2019-08-25 ENCOUNTER — Ambulatory Visit (INDEPENDENT_AMBULATORY_CARE_PROVIDER_SITE_OTHER): Payer: Medicare Other | Admitting: Nurse Practitioner

## 2019-08-25 VITALS — BP 122/78 | HR 90 | Temp 96.6°F | Ht 72.0 in | Wt 257.8 lb

## 2019-08-25 DIAGNOSIS — E782 Mixed hyperlipidemia: Secondary | ICD-10-CM | POA: Diagnosis not present

## 2019-08-25 DIAGNOSIS — I10 Essential (primary) hypertension: Secondary | ICD-10-CM

## 2019-08-25 DIAGNOSIS — E1165 Type 2 diabetes mellitus with hyperglycemia: Secondary | ICD-10-CM

## 2019-08-25 DIAGNOSIS — J449 Chronic obstructive pulmonary disease, unspecified: Secondary | ICD-10-CM | POA: Insufficient documentation

## 2019-08-25 DIAGNOSIS — M549 Dorsalgia, unspecified: Secondary | ICD-10-CM

## 2019-08-25 DIAGNOSIS — F17200 Nicotine dependence, unspecified, uncomplicated: Secondary | ICD-10-CM

## 2019-08-25 DIAGNOSIS — E6609 Other obesity due to excess calories: Secondary | ICD-10-CM

## 2019-08-25 DIAGNOSIS — I5022 Chronic systolic (congestive) heart failure: Secondary | ICD-10-CM

## 2019-08-25 DIAGNOSIS — Z8551 Personal history of malignant neoplasm of bladder: Secondary | ICD-10-CM | POA: Diagnosis not present

## 2019-08-25 DIAGNOSIS — Z6834 Body mass index (BMI) 34.0-34.9, adult: Secondary | ICD-10-CM

## 2019-08-25 NOTE — Patient Instructions (Addendum)
To AVOID NSAIDS (Aleve, Advil, Motrin, Ibuprofen)   To take tylenol 500 mg 1-2 tablets every 8 hours as needed for pain.    clarify medication that you are taking  STOP SMOKING   Make appt with eye doctor for a diabetic eye exam   FOLLOW UP  6 months with labs prior to appt  To make AWV visit- telephone visit.

## 2019-08-25 NOTE — Progress Notes (Signed)
Careteam: Patient Care Team: Lauree Chandler, NP as PCP - General (Geriatric Medicine) Sueanne Margarita, MD as PCP - Cardiology (Cardiology) Melissa Montane, MD as Consulting Physician (Otolaryngology) [name], MD as Consulting Physician (Urology)  PLACE OF SERVICE:  [place] Directive information Does Patient Have a Medical Advance Directive?: No, Would patient like information on creating a medical advance directive?: Yes (MAU/Ambulatory/Procedural Areas - Information given)  Allergies  Allergen Reactions  . Chantix [Varenicline Tartrate]     Aggression     Chief Complaint  Patient presents with  . Medical Management of Chronic Issues    3 month follow-up.   . Medication Management    Discuss Baclofen, taken differently than prescribed   . Quality Metric Gaps    Discuss need for eye exam and MALB  . Immunizations    Discuss need for TD vaccine      HPI: Patient is a 69 y.o. male for routine follow up.  Has had several falls recently. [place] coming off of a commuter jet after a trip, scraped up his arm otherwise no injury. Then he was moving a refrigerator and ribs hit on the fender and fridge fell on leg. Several days later went to urgent care and was evaluated. Overall pain better-fall was 2 weeks ago     Smoking- started back smoking, smoking 5 cigarettes a day, planning to get back on the patch  CHF- continues on lasix, entreso, aldactone and toprol. No swelling or shortness of breath   CAD- stable without angina, continus on ASA, BB and statin. Followed by cardiology  COPD- stable. Reports he is breathing better overall despite restarted smoking. Has gotten his cough back.   DM- metformin 100 mg BID with glipizide twice daily, last A1c 2 weeks ago and has improved to 6.6. no hypoglycemia. CBGS in the 90s  Bladder cancer- following with urologist   OSA- does not tolerate Cpap  Hyperlipidemia- continues on statin  Does not want COVID  vaccine.    Review of Systems:  Review of Systems  Constitutional: Negative for chills, fever and weight loss.  HENT: Negative for tinnitus.   Respiratory: Positive for cough. Negative for sputum production and shortness of breath.   Cardiovascular: Negative for chest pain, palpitations and leg swelling.  Gastrointestinal: Negative for abdominal pain, constipation, diarrhea and heartburn.  Genitourinary: Negative for dysuria, frequency and urgency.  Musculoskeletal: Negative for back pain, falls, joint pain and myalgias.  Skin: Negative.   Neurological: Negative for dizziness and headaches.  Psychiatric/Behavioral: Negative for depression and memory loss. The patient does not have insomnia.     Past Medical History:  Diagnosis Date  . Arthritis   . Bladder cancer [facility]) urologist-  dr Kipp Brood  . Borderline diabetes   . Bursitis of both shoulders   . Diabetes mellitus without complication ([place])   . Disorder of male genital organs, unspecified   . Esophagus disorder    Constricted Esophagus, per New Patient Packet-PSC   . History of basal cell carcinoma (BCC) excision    05/ 2018  right forehead  . Hypertension   . Mild acid reflux no meds  . Nocturia   . OSA (obstructive sleep apnea)    per pt has not used cpap in years  . Productive cough    from smoking  . Renal cyst, left 11/2011   16 mm  . Sleep apnea    Per New Patient Packet-PSC   . Smokers' cough ([place])   .  SOB (shortness of breath) on exertion    Past Surgical History:  Procedure Laterality Date  . CYSTOSCOPY WITH BIOPSY  03/13/2012   Procedure: CYSTOSCOPY WITH BIOPSY;  Surgeon: Fredricka Bonine, MD;  [facility];  Service: Urology;  Laterality: N/A;     . ESOPHAGEAL DILATION  05/20/1986   Esophagus Stretch, per New Patient Packet-PSC   . RIGHT/LEFT HEART CATH AND CORONARY ANGIOGRAPHY N/A 04/16/2019   Procedure: RIGHT/LEFT HEART CATH AND CORONARY ANGIOGRAPHY;  Surgeon: Troy Sine, MD;  Location: [place] [facility];  Service: Cardiovascular;  Laterality: N/A;  . TONSILLECTOMY  age 59  . TRANSURETHRAL RESECTION OF BLADDER TUMOR N/A 05/23/2017   Procedure: TRANSURETHRAL RESECTION OF BLADDER TUMOR / (TURBT) WITH INSTILLATION OF EPIRUBICIN;  Surgeon: [name], MD;  Location: [facility];  Service: Urology;  Laterality: N/A;  . UVULECTOMY  05/20/1990   Dr.Byers, per new patient packet   . UVULOPALATOPHARYNGOPLASTY  2005   osa   Social History:   reports that he has been smoking cigarettes. He has a 4.70 pack-year smoking history. He has quit using smokeless tobacco.  His smokeless tobacco use included chew and snuff. He reports current alcohol use. He reports current drug use. Drug: Marijuana.  Family History  Problem Relation Age of Onset  . Diabetes Mother   . Dementia Mother   . Stroke Mother   . Arthritis Mother   . Hypertension Mother   . Multiple sclerosis Father   . Diabetes Father   . Diabetes Sister   . Hypertension Sister   . ADD / ADHD Son   . Migraines Daughter   . ADD / ADHD Daughter   . Cancer Maternal Grandmother     Medications: Patient's Medications  New Prescriptions   No medications on file  Previous Medications   ACETAMINOPHEN (TYLENOL) 500 MG TABLET    Take 2 tablets (1,000 mg total) by mouth every 8 (eight) hours as needed for moderate pain.   ALBUTEROL (VENTOLIN HFA) 108 (90 BASE) MCG/ACT INHALER    Inhale 2 puffs into the lungs every 6 (six) hours as needed for wheezing or shortness of breath.   ASPIRIN 81 MG CHEWABLE TABLET    Chew 1 tablet (81 mg total) by mouth daily.   ATORVASTATIN (LIPITOR) 80 MG TABLET    Take 1 tablet (80 mg total) by mouth daily at 6 PM.   BACLOFEN (LIORESAL) 10 MG TABLET    TAKE 1/2 TABLET BY MOUTH 3 TIMES DAILY.   FUROSEMIDE (LASIX) 40 MG TABLET    Take 1 tablet (40 mg total) by mouth daily.   GLIPIZIDE (GLUCOTROL) 5 MG TABLET    Take 1 tablet (5 mg total) by mouth 2 (two)  times daily.   METFORMIN (GLUCOPHAGE) 1000 MG TABLET    TAKE (1) TABLET BY MOUTH TWICE A DAY WITH MEALS (BREAKFAST AND SUPPER).   METOPROLOL SUCCINATE (TOPROL-XL) 50 MG 24 HR TABLET    Take 1 tablet (50 mg total) by mouth daily.   MULTIPLE VITAMIN (MULTIVITAMIN WITH MINERALS) TABS TABLET    Take 1 tablet by mouth daily.   NICOTINE (NICODERM CQ - DOSED IN MG/24 HOURS) 21 MG/24HR PATCH    Place 1 patch (21 mg total) onto the skin daily.   POTASSIUM CHLORIDE SA (KLOR-CON) 20 MEQ TABLET    Take 2 tablets (40 mEq total) by mouth daily.   SACUBITRIL-VALSARTAN (ENTRESTO) 24-26 MG    Take 1 tablet by mouth 2 (two)  times daily.   SPIRONOLACTONE (ALDACTONE) 25 MG TABLET    Take 0.5 tablets (12.5 mg total) by mouth daily.   UMECLIDINIUM-VILANTEROL (ANORO ELLIPTA) 62.5-25 MCG/INH AEPB    Inhale 1 puff into the lungs daily.  Modified Medications   No medications on file  Discontinued Medications   BENZONATATE (TESSALON) 100 MG CAPSULE    Take 1 capsule (100 mg total) by mouth 3 (three) times daily.   CYCLOBENZAPRINE (FLEXERIL) 10 MG TABLET    Take 0.5 tablets (5 mg total) by mouth 2 (two) times daily as needed for muscle spasms.   DOXYCYCLINE (VIBRA-TABS) 100 MG TABLET    Take 1 tablet (100 mg total) by mouth every 12 (twelve) hours.   GUAIFENESIN (MUCINEX) 600 MG 12 HR TABLET    Take 1 tablet (600 mg total) by mouth 2 (two) times daily.    Physical Exam:  Vitals:   08/25/19 1341  BP: 122/78  Pulse: 90  Temp: (!) 96.6 F (35.9 C)  TempSrc: Temporal  SpO2: 96%  Weight: 257 lb 12.8 oz (116.9 kg)  Height: 6' (1.829 m)   Body mass index is 34.96 kg/m. Wt Readings from Last 3 Encounters:  08/25/19 257 lb 12.8 oz (116.9 kg)  06/01/19 250 lb 3.2 oz (113.5 kg)  04/18/19 235 lb 3.2 oz (106.7 kg)    Physical Exam Constitutional:      General: He is not in acute distress.    Appearance: He is well-developed. He is not diaphoretic.  HENT:     Head: Normocephalic and atraumatic.     Mouth/Throat:       Pharynx: No oropharyngeal exudate.  Eyes:     Conjunctiva/sclera: Conjunctivae normal.     Pupils: Pupils are equal, round, and reactive to light.  Cardiovascular:     Rate and Rhythm: Normal rate and regular rhythm.     Heart sounds: Normal heart sounds.  Pulmonary:     Effort: Pulmonary effort is normal.     Breath sounds: Normal breath sounds.  Abdominal:     General: Bowel sounds are normal.     Palpations: Abdomen is soft.  Musculoskeletal:        General: No tenderness.     Cervical back: Normal range of motion and neck supple.  Skin:    General: Skin is warm and dry.  Neurological:     Mental Status: He is alert and oriented to person, place, and time.     Labs reviewed: Basic Metabolic Panel: Recent Labs    04/15/19 0432 04/15/19 0901 04/16/19 0420 04/16/19 1523 04/17/19 0451 04/17/19 0451 04/18/19 0505 05/18/19 0825 08/11/19 0810  NA   < >  --  138   < > 138   < > 139 139 141  K   < >  --  4.3   < > 4.0   < > 3.2* 4.4 4.4  CL   < >  --  99  --  99   < > 99 102 105  CO2   < >  --  28  --  27   < > 28 32 27  GLUCOSE   < >  --  207*  --  256*   < > 136* 151* 142*  BUN   < >  --  12  --  21   < > 19 16 14   CREATININE   < >  --  0.84  --  1.07   < > 0.95 0.86 0.95  CALCIUM   < >  --  9.6  --  9.2   < > 9.0 10.1 9.6  MG   < >  --  2.2  --  2.2  --  2.2  --   --   TSH  --  0.303*  --   --   --   --   --   --   --    < > = values in this interval not displayed.   Liver Function Tests: Recent Labs    09/22/18 0805 01/04/19 0908 05/18/19 0825  AST 13 13 14   ALT 19 22 23   BILITOT 1.1 1.5* 0.9  PROT 6.2 6.2 6.2   No results for input(s): LIPASE, AMYLASE in the last 8760 hours. No results for input(s): AMMONIA in the last 8760 hours. CBC: Recent Labs    09/22/18 0805 04/14/19 1729 04/17/19 0451 04/18/19 0505 05/18/19 0825  WBC 7.8   < > 19.5* 16.4* 7.9  NEUTROABS 4,438  --   --   --  4,780  HGB 15.9   < > 14.5 14.6 15.1  HCT 46.8   < > 43.8  44.4 44.5  MCV 89.1   < > 90.1 89.2 89.4  PLT 274   < > 352 361 220   < > = values in this interval not displayed.   Lipid Panel: Recent Labs    09/22/18 0805 04/16/19 0420 05/18/19 0825  CHOL 163 156 97  HDL 43 45 52  LDLCALC 98 101* 29  TRIG 123 48 82  CHOLHDL 3.8 3.5 1.9   TSH: Recent Labs    04/15/19 0901  TSH 0.303*   A1C: Lab Results  Component Value Date   HGBA1C 6.6 (H) 08/11/2019     Assessment/Plan 1. Type 2 diabetes mellitus with hyperglycemia, without long-term current use of insulin (HCC) -well controlled on current regimen, continues on glipizide 5 mg BID with metformin 100 mg BID  -Encouraged dietary compliance, routine foot care/monitoring and to keep up with diabetic eye exams through ophthalmology  - Microalbumin, urine  2. Essential hypertension -controlled on aldactone, lasix, metoprolol, also using entresto for heart failure. Also encouraged lifestyle modifications.   3. Smoker Encouraged cessation  4. Mixed hyperlipidemia At goal on current regimen  5. Hx of bladder cancer Followed by urology  6. Class 1 obesity due to excess calories with serious comorbidity and body mass index (BMI) of 34.0 to 34.9 in adult Ongoing, discussed diet modifications and increase in physical activity to promote healthy weight loss.  7. Chronic systolic CHF (congestive heart failure) (HCC) -stable, euvolemic ithout signs of increase fluid retention. Continue current medications and cardiology follow up.  8. Chronic obstructive pulmonary disease, unspecified COPD type ([place]) Has started back smoking and cough has returned. Encouraged cessation. Continues anoro ellipta daily.  9. Back pain Has improved from falls. Using NSAID PRN, educated to AVOID NSAIDS and use tylenol PRN  Next appt: 6 months with labs prior to visit.  Carlos American. [place], [facility] [phone]

## 2019-08-26 LAB — MICROALBUMIN, URINE: Microalb, Ur: 0.4 mg/dL

## 2019-08-27 ENCOUNTER — Other Ambulatory Visit: Payer: Self-pay | Admitting: Nurse Practitioner

## 2019-08-27 NOTE — Telephone Encounter (Signed)
Medication was ePrescibed by pharmacy for refill. Patient was last seen 08/25/2019 and has upcoming appointment 09/09/2019. Medication pend and sent to provider Lauree Chandler, NP. Please Advise.

## 2019-09-09 ENCOUNTER — Other Ambulatory Visit: Payer: Self-pay

## 2019-09-09 ENCOUNTER — Encounter: Payer: Medicare Other | Admitting: Nurse Practitioner

## 2019-09-09 NOTE — Progress Notes (Signed)
    This service is provided via telemedicine  No vital signs collected/recorded due to the encounter was a telemedicine visit.   Location of patient (ex: home, work):  Home  Patient consents to a telephone visit:  Yes, see telephone encounter dated 09/09/2019 with annual consent  Location of the provider (ex: office, home):  [place]   Name of any referring provider:  N/A  Names of all persons participating in the telemedicine service and their role in the encounter:  S.Chrae B/CMA, Sherrie Mustache, NP, and Patient   Time spent on call:  7 min with medical assistant

## 2019-09-10 NOTE — Progress Notes (Signed)
This encounter was created in error - please disregard.

## 2019-09-16 ENCOUNTER — Other Ambulatory Visit: Payer: Self-pay

## 2019-09-16 ENCOUNTER — Ambulatory Visit (INDEPENDENT_AMBULATORY_CARE_PROVIDER_SITE_OTHER): Payer: Medicare Other | Admitting: Nurse Practitioner

## 2019-09-16 ENCOUNTER — Encounter: Payer: Self-pay | Admitting: Nurse Practitioner

## 2019-09-16 DIAGNOSIS — Z0000 Encounter for general adult medical examination without abnormal findings: Secondary | ICD-10-CM | POA: Diagnosis not present
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
## 2019-09-16 NOTE — Patient Instructions (Signed)
Timothy Lee , Thank you for taking time to come for your Medicare Wellness Visit. I appreciate your ongoing commitment to your health goals. Please review the following plan we discussed and let me know if I can assist you in the future.   Screening recommendations/referrals: Colonoscopy - cancer screening - up to date- due 2022  Recommended yearly ophthalmology/optometry visit for glaucoma screening and checkup Recommended yearly dental visit for hygiene and checkup  Vaccinations: Influenza vaccine - declines Pneumococcal vaccine -up to date Tdap vaccine RECOMMENDED- to get at local pharmacy Shingles vaccine -RECOMMENDED- to get at local pharmacy    Advanced directives: recommended to complete and bring copies to office   Conditions/risks identified: cardiovascular risk, weight, smoking.   Next appointment: 1 year.   Preventive Care 68 Years and Older, Male Preventive care refers to lifestyle choices and visits with your health care provider that can promote health and wellness. What does preventive care include?  A yearly physical exam. This is also called an annual well check.  Dental exams once or twice a year.  Routine eye exams. Ask your health care provider how often you should have your eyes checked.  Personal lifestyle choices, including:  Daily care of your teeth and gums.  Regular physical activity.  Eating a healthy diet.  Avoiding tobacco and drug use.  Limiting alcohol use.  Practicing safe sex.  Taking low doses of aspirin every day.  Taking vitamin and mineral supplements as recommended by your health care provider. What happens during an annual well check? The services and screenings done by your health care provider during your annual well check will depend on your age, overall health, lifestyle risk factors, and family history of disease. Counseling  Your health care provider may ask you questions about your:  Alcohol use.  Tobacco use.  Drug  use.  Emotional well-being.  Home and relationship well-being.  Sexual activity.  Eating habits.  History of falls.  Memory and ability to understand (cognition).  Work and work Statistician. Screening  You may have the following tests or measurements:  Height, weight, and BMI.  Blood pressure.  Lipid and cholesterol levels. These may be checked every 5 years, or more frequently if you are over 39 years old.  Skin check.  Lung cancer screening. You may have this screening every year starting at age 74 if you have a 30-pack-year history of smoking and currently smoke or have quit within the past 15 years.  Fecal occult blood test (FOBT) of the stool. You may have this test every year starting at age 48.  Flexible sigmoidoscopy or colonoscopy. You may have a sigmoidoscopy every 5 years or a colonoscopy every 10 years starting at age 76.  Prostate cancer screening. Recommendations will vary depending on your family history and other risks.  Hepatitis C blood test.  Hepatitis B blood test.  Sexually transmitted disease (STD) testing.  Diabetes screening. This is done by checking your blood sugar (glucose) after you have not eaten for a while (fasting). You may have this done every 1-3 years.  Abdominal aortic aneurysm (AAA) screening. You may need this if you are a current or former smoker.  Osteoporosis. You may be screened starting at age 68 if you are at high risk. Talk with your health care provider about your test results, treatment options, and if necessary, the need for more tests. Vaccines  Your health care provider may recommend certain vaccines, such as:  Influenza vaccine. This is recommended every year.  Tetanus, diphtheria, and acellular pertussis (Tdap, Td) vaccine. You may need a Td booster every 10 years.  Zoster vaccine. You may need this after age 67.  Pneumococcal 13-valent conjugate (PCV13) vaccine. One dose is recommended after age 76.   Pneumococcal polysaccharide (PPSV23) vaccine. One dose is recommended after age 86. Talk to your health care provider about which screenings and vaccines you need and how often you need them. This information is not intended to replace advice given to you by your health care provider. Make sure you discuss any questions you have with your health care provider. Document Released: 06/02/2015 Document Revised: 01/24/2016 Document Reviewed: 03/07/2015 Elsevier Interactive Patient Education  2017 [employer] Prevention in the Home Falls can cause injuries. They can happen to people of all ages. There are many things you can do to make your home safe and to help prevent falls. What can I do on the outside of my home?  Regularly fix the edges of walkways and driveways and fix any cracks.  Remove anything that might make you trip as you walk through a door, such as a raised step or threshold.  Trim any bushes or trees on the path to your home.  Use bright outdoor lighting.  Clear any walking paths of anything that might make someone trip, such as rocks or tools.  Regularly check to see if handrails are loose or broken. Make sure that both sides of any steps have handrails.  Any raised decks and porches should have guardrails on the edges.  Have any leaves, snow, or ice cleared regularly.  Use sand or salt on walking paths during winter.  Clean up any spills in your garage right away. This includes oil or grease spills. What can I do in the bathroom?  Use night lights.  Install grab bars by the toilet and in the tub and shower. Do not use towel bars as grab bars.  Use non-skid mats or decals in the tub or shower.  If you need to sit down in the shower, use a plastic, non-slip stool.  Keep the floor dry. Clean up any water that spills on the floor as soon as it happens.  Remove soap buildup in the tub or shower regularly.  Attach bath mats securely with double-sided non-slip  rug tape.  Do not have throw rugs and other things on the floor that can make you trip. What can I do in the bedroom?  Use night lights.  Make sure that you have a light by your bed that is easy to reach.  Do not use any sheets or blankets that are too big for your bed. They should not hang down onto the floor.  Have a firm chair that has side arms. You can use this for support while you get dressed.  Do not have throw rugs and other things on the floor that can make you trip. What can I do in the kitchen?  Clean up any spills right away.  Avoid walking on wet floors.  Keep items that you use a lot in easy-to-reach places.  If you need to reach something above you, use a strong step stool that has a grab bar.  Keep electrical cords out of the way.  Do not use floor polish or wax that makes floors slippery. If you must use wax, use non-skid floor wax.  Do not have throw rugs and other things on the floor that can make you trip. What can I do  with my stairs?  Do not leave any items on the stairs.  Make sure that there are handrails on both sides of the stairs and use them. Fix handrails that are broken or loose. Make sure that handrails are as long as the stairways.  Check any carpeting to make sure that it is firmly attached to the stairs. Fix any carpet that is loose or worn.  Avoid having throw rugs at the top or bottom of the stairs. If you do have throw rugs, attach them to the floor with carpet tape.  Make sure that you have a light switch at the top of the stairs and the bottom of the stairs. If you do not have them, ask someone to add them for you. What else can I do to help prevent falls?  Wear shoes that:  Do not have high heels.  Have rubber bottoms.  Are comfortable and fit you well.  Are closed at the toe. Do not wear sandals.  If you use a stepladder:  Make sure that it is fully opened. Do not climb a closed stepladder.  Make sure that both sides of  the stepladder are locked into place.  Ask someone to hold it for you, if possible.  Clearly mark and make sure that you can see:  Any grab bars or handrails.  First and last steps.  Where the edge of each step is.  Use tools that help you move around (mobility aids) if they are needed. These include:  Canes.  Walkers.  Scooters.  Crutches.  Turn on the lights when you go into a dark area. Replace any light bulbs as soon as they burn out.  Set up your furniture so you have a clear path. Avoid moving your furniture around.  If any of your floors are uneven, fix them.  If there are any pets around you, be aware of where they are.  Review your medicines with your doctor. Some medicines can make you feel dizzy. This can increase your chance of falling. Ask your doctor what other things that you can do to help prevent falls. This information is not intended to replace advice given to you by your health care provider. Make sure you discuss any questions you have with your health care provider. Document Released: 03/02/2009 Document Revised: 10/12/2015 Document Reviewed: 06/10/2014 Elsevier Interactive Patient Education  2017 [employer].

## 2019-09-23 ENCOUNTER — Other Ambulatory Visit: Payer: Self-pay | Admitting: Nurse Practitioner

## 2019-09-24 NOTE — Telephone Encounter (Signed)
Per verbal conversation with Lauree Chandler, NP ok to fill this time and to make note needs appointment for additional refills.    Per Lauree Chandler, NP this is a short term medication

## 2019-10-04 ENCOUNTER — Other Ambulatory Visit: Payer: Self-pay | Admitting: Nurse Practitioner

## 2019-10-04 DIAGNOSIS — E1165 Type 2 diabetes mellitus with hyperglycemia: Secondary | ICD-10-CM

## 2019-10-06 NOTE — Progress Notes (Signed)
Cardiology Office Note:    Date:  10/07/2019   ID:  Timothy Lee, DOB April 11, 1951, MRN [id number]  PCP:  Lauree Chandler, NP  Cardiologist:  Fransico Him, MD    Referring MD: Lauree Chandler, NP   Chief Complaint  Patient presents with  . Coronary Artery Disease  . Cardiomyopathy  . Hypertension  . Hyperlipidemia    History of Present Illness:    Timothy Lee is a 69 y.o. male with a hx of hypertension, DM2, untreated OSA, COPD and longstanding tobacco use.  He was admitted 03/2019 for acute CHF and COPD exacerbation. Echocardiogram showed LVEF at 35 to 40% with global hypokinesis and left ventricular regional wall motion abnormalities. Cath showed 20% prox and 60% mid LAD stenosis and med management was recommended. He was started on guideline directed medical therapy for HF and high intensity statin was started. He was seen back in office by the PA and was doing well.    He is here today for followup and is doing well.  He is back smoking about 1/4 to 1/2ppd but is planning to get back off of the cigarettes.  He started smoking due to some stress after an accident. He has an episode last week while in [place] where he got dizzy and drank some water and felt better.  When he got home his daughter told him he did not look good and he took his SBP was in the 80's.  He drank more water and rested and then felt better later. He denies any anginal chest pain or pressure, PND, orthopnea, palpitations, edema  or syncope. He has chronic DOE that is stable without any change.  He is compliant with his meds and is tolerating meds with no SE.    Past Medical History:  Diagnosis Date  . Arthritis   . Bladder cancer [facility]) urologist-  dr Kipp Brood  . Borderline diabetes   . Bursitis of both shoulders   . Diabetes mellitus without complication ([place])   . Disorder of male genital organs, unspecified   . Esophagus disorder    Constricted Esophagus, per New Patient Packet-PSC   .  History of basal cell carcinoma (BCC) excision    05/ 2018  right forehead  . Hypertension   . Mild acid reflux no meds  . Nocturia   . OSA (obstructive sleep apnea)    per pt has not used cpap in years  . Productive cough    from smoking  . Renal cyst, left 11/2011   16 mm  . Sleep apnea    Per New Patient Packet-PSC   . Smokers' cough ([place])   . SOB (shortness of breath) on exertion     Past Surgical History:  Procedure Laterality Date  . CYSTOSCOPY WITH BIOPSY  03/13/2012   Procedure: CYSTOSCOPY WITH BIOPSY;  Surgeon: Fredricka Bonine, MD;  Location: [facility];  Service: Urology;  Laterality: N/A;     . ESOPHAGEAL DILATION  05/20/1986   Esophagus Stretch, per New Patient Packet-PSC   . RIGHT/LEFT HEART CATH AND CORONARY ANGIOGRAPHY N/A 04/16/2019   Procedure: RIGHT/LEFT HEART CATH AND CORONARY ANGIOGRAPHY;  Surgeon: Troy Sine, MD;  Location: [place] [facility];  Service: Cardiovascular;  Laterality: N/A;  . TONSILLECTOMY  age 83  . TRANSURETHRAL RESECTION OF BLADDER TUMOR N/A 05/23/2017   Procedure: TRANSURETHRAL RESECTION OF BLADDER TUMOR / (TURBT) WITH INSTILLATION OF EPIRUBICIN;  Surgeon: [name], MD;  Location: [facility];  Service: Urology;  Laterality: N/A;  . UVULECTOMY  05/20/1990   Dr.Byers, per new patient packet   . UVULOPALATOPHARYNGOPLASTY  2005   osa    Current Medications: Current Meds  Medication Sig  . acetaminophen (TYLENOL) 500 MG tablet Take 2 tablets (1,000 mg total) by mouth every 8 (eight) hours as needed for moderate pain.  Marland Kitchen albuterol (VENTOLIN HFA) 108 (90 Base) MCG/ACT inhaler Inhale 2 puffs into the lungs every 6 (six) hours as needed for wheezing or shortness of breath.  Marland Kitchen aspirin 81 MG chewable tablet Chew 1 tablet (81 mg total) by mouth daily.  Marland Kitchen atorvastatin (LIPITOR) 80 MG tablet Take 1 tablet (80 mg total) by mouth daily at 6 PM.  . baclofen (LIORESAL) 10 MG tablet Take 0.5 tablets (5  mg total) by mouth 3 (three) times daily. Needs appointment for additional refills  . furosemide (LASIX) 40 MG tablet Take 1 tablet (40 mg total) by mouth daily.  Marland Kitchen glipiZIDE (GLUCOTROL) 5 MG tablet Take 1 tablet (5 mg total) by mouth 2 (two) times daily.  . metFORMIN (GLUCOPHAGE) 1000 MG tablet TAKE (1) TABLET BY MOUTH TWICE A DAY WITH MEALS (BREAKFAST AND SUPPER).  . metoprolol succinate (TOPROL-XL) 50 MG 24 hr tablet Take 1 tablet (50 mg total) by mouth daily.  . Multiple Vitamin (MULTIVITAMIN WITH MINERALS) TABS tablet Take 1 tablet by mouth daily.  . nicotine (NICODERM CQ - DOSED IN MG/24 HOURS) 21 mg/24hr patch Place 1 patch (21 mg total) onto the skin daily.  . potassium chloride SA (KLOR-CON) 20 MEQ tablet Take 2 tablets (40 mEq total) by mouth daily.  . sacubitril-valsartan (ENTRESTO) 24-26 MG Take 1 tablet by mouth 2 (two) times daily.  Marland Kitchen spironolactone (ALDACTONE) 25 MG tablet Take 0.5 tablets (12.5 mg total) by mouth daily.  Marland Kitchen umeclidinium-vilanterol (ANORO ELLIPTA) 62.5-25 MCG/INH AEPB Inhale 1 puff into the lungs daily.     Allergies:   Chantix [varenicline tartrate]   Social History   Socioeconomic History  . Marital status: Married    Spouse name: Not on file  . Number of children: Not on file  . Years of education: Not on file  . Highest education level: Not on file  Occupational History  . Not on file  Tobacco Use  . Smoking status: Current Every Day Smoker    Packs/day: 0.10    Years: 47.00    Pack years: 4.70    Types: Cigarettes  . Smokeless tobacco: Former Systems developer    Types: Chew, Snuff  . Tobacco comment: cutting back almost quit   Substance and Sexual Activity  . Alcohol use: Yes    Comment: very seldom- rare  . Drug use: Yes    Types: Marijuana    Comment: seldom-rare  . Sexual activity: Not Currently  Other Topics Concern  . Not on file  Social History Narrative   Diet: None      Caffeine: Yes      Married, if yes what year:  Yes, 1990      Do  you live in a house, apartment, assisted living, condo, trailer, ect: House, 3 stories, 4 persons      Pets: 2 dogs, 2 cats       Highest level of education: Some Facilities manager profession: Clinical cytogeneticist       Exercise: Some         Living Will: Yes   DNR: Yes   POA/HPOA: Yes  Functional Status:   Do you have difficulty bathing or dressing yourself? No   Do you have difficulty preparing food or eating? No   Do you have difficulty managing your medications? No   Do you have difficulty managing your finances? No   Do you have difficulty affording your medications? No   Social Determinants of Health   Financial Resource Strain:   . Difficulty of Paying Living Expenses:   Food Insecurity:   . Worried About Charity fundraiser in the Last Year:   . Arboriculturist in the Last Year:   Transportation Needs:   . Film/video editor (Medical):   Marland Kitchen Lack of Transportation (Non-Medical):   Physical Activity:   . Days of Exercise per Week:   . Minutes of Exercise per Session:   Stress:   . Feeling of Stress :   Social Connections:   . Frequency of Communication with Friends and Family:   . Frequency of Social Gatherings with Friends and Family:   . Attends Religious Services:   . Active Member of Clubs or Organizations:   . Attends Archivist Meetings:   Marland Kitchen Marital Status:      Family History: The patient's family history includes ADD / ADHD in his daughter and son; Arthritis in his mother; Cancer in his maternal grandmother; Dementia in his mother; Diabetes in his father, mother, and sister; Hypertension in his mother and sister; Migraines in his daughter; Multiple sclerosis in his father; Stroke in his mother.  ROS:   Please see the history of present illness.    ROS  All other systems reviewed and negative.   EKGs/Labs/Other Studies Reviewed:    The following studies were reviewed today: 2D echo, cardiac cath  EKG:  EKG is not ordered today.      Recent Labs: 04/14/2019: B Natriuretic Peptide 288.0 04/15/2019: TSH 0.303 04/18/2019: Magnesium 2.2 05/18/2019: ALT 23; Hemoglobin 15.1; Platelets 220 08/11/2019: BUN 14; Creat 0.95; Potassium 4.4; Sodium 141   Recent Lipid Panel    Component Value Date/Time   CHOL 97 05/18/2019 0825   TRIG 82 05/18/2019 0825   HDL 52 05/18/2019 0825   CHOLHDL 1.9 05/18/2019 0825   VLDL 10 04/16/2019 0420   LDLCALC 29 05/18/2019 0825    Physical Exam:    VS:  BP (!) 142/90   Pulse 99   Ht 6' (1.829 m)   Wt 262 lb (118.8 kg)   SpO2 95%   BMI 35.53 kg/m     Wt Readings from Last 3 Encounters:  10/07/19 262 lb (118.8 kg)  08/25/19 257 lb 12.8 oz (116.9 kg)  06/01/19 250 lb 3.2 oz (113.5 kg)     GEN:  Well nourished, well developed in no acute distress HEENT: Normal NECK: No JVD; No carotid bruits LYMPHATICS: No lymphadenopathy CARDIAC: RRR, no murmurs, rubs, gallops RESPIRATORY:  Clear to auscultation without rales, wheezing or rhonchi  ABDOMEN: Soft, non-tender, non-distended MUSCULOSKELETAL:  No edema; No deformity  SKIN: Warm and dry NEUROLOGIC:  Alert and oriented x 3 PSYCHIATRIC:  Normal affect   ASSESSMENT:    1. Coronary artery disease involving native coronary artery of native heart without angina pectoris   2. Chronic systolic CHF (congestive heart failure) ([place])   3. Essential hypertension   4. Hyperlipidemia LDL goal <70   5. Tobacco abuse    PLAN:    In order of problems listed above:  1.  ASCAD -Cath 03/2019 with 20% prox and 60%  mid LAD stenosis >>medical management recommended -he denies any anginal symptoms -continue ASA, BB and statin  2.  Ischemic DCM/Chronic systolic CHF -2D echo [id number] with EF 35-40%  -he appears euvolemic on exam and has no LE edema -tolerating HF meds -decrease lasix to 20mg  daily since he has no edema and BP dropped the one day -continue on Toprol XL 50mg  daily -increase spiro to 25mg  daily -increase Entresto to 49-51mg   BID -check BMET in 1 week -followup with PharmD in HTN clinic in 2 weeks for possible uptitration of Entresto -repeat 2D echo in 6 weeks  3.  HTN -BP borderline controlled on exam but has not taken his meds today -Creatinine was stable at 0.95 in March -continue Toprol XL 50mg  daily, Entresto  and Spiro   4.  HLD -LDL goal < 70 -LDL 29 in Dec 2020 -continue Atorvastatin 80mg  daily  5.  Tobacco abuse -encouraged to cut back and ultimately quit    Medication Adjustments/Labs and Tests Ordered: Current medicines are reviewed at length with the patient today.  Concerns regarding medicines are outlined above.  No orders of the defined types were placed in this encounter.  No orders of the defined types were placed in this encounter.   Signed, Fransico Him, MD  10/07/2019 10:24 AM    [place]

## 2019-10-07 ENCOUNTER — Ambulatory Visit: Payer: Medicare Other | Admitting: Cardiology

## 2019-10-07 ENCOUNTER — Encounter: Payer: Self-pay | Admitting: Cardiology

## 2019-10-07 ENCOUNTER — Other Ambulatory Visit: Payer: Self-pay

## 2019-10-07 VITALS — BP 142/90 | HR 99 | Ht 72.0 in | Wt 262.0 lb

## 2019-10-07 DIAGNOSIS — I2510 Atherosclerotic heart disease of native coronary artery without angina pectoris: Secondary | ICD-10-CM | POA: Diagnosis not present

## 2019-10-07 DIAGNOSIS — I10 Essential (primary) hypertension: Secondary | ICD-10-CM | POA: Diagnosis not present

## 2019-10-07 DIAGNOSIS — I5022 Chronic systolic (congestive) heart failure: Secondary | ICD-10-CM

## 2019-10-07 DIAGNOSIS — E785 Hyperlipidemia, unspecified: Secondary | ICD-10-CM | POA: Diagnosis not present

## 2019-10-07 DIAGNOSIS — Z720 Tobacco use: Secondary | ICD-10-CM | POA: Diagnosis not present

## 2019-10-07 MED ORDER — SPIRONOLACTONE 25 MG PO TABS
25.0000 mg | ORAL_TABLET | Freq: Every day | ORAL | 3 refills | Status: DC
Start: 2019-10-07 — End: 2019-12-09

## 2019-10-07 MED ORDER — ENTRESTO 49-51 MG PO TABS: 1.0000 | ORAL_TABLET | Freq: Two times a day (BID) | ORAL | 0 refills | Status: DC

## 2019-10-07 MED ORDER — FUROSEMIDE 20 MG PO TABS
20.0000 mg | ORAL_TABLET | Freq: Every day | ORAL | 3 refills | Status: DC
Start: 2019-10-07 — End: 2019-11-15

## 2019-10-07 NOTE — Addendum Note (Signed)
Addended by: Antonieta Iba on: 10/07/2019 10:46 AM   Modules accepted: Orders

## 2019-10-07 NOTE — Patient Instructions (Signed)
Medication Instructions:  Your physician has recommended you make the following change in your medication:  1) INCREASE Entresto to 49-51 mg twice daily 2) INCREASE spironolactone to 25 mg daily  3) DECREASE Lasix  *If you need a refill on your cardiac medications before your next appointment, please call your pharmacy*   Lab Work: BMET in one week.  If you have labs (blood work) drawn today and your tests are completely normal, you will receive your results only by: Marland Kitchen MyChart Message (if you have MyChart) OR . A paper copy in the mail If you have any lab test that is abnormal or we need to change your treatment, we will call you to review the results.   Testing/Procedures: Your physician has requested that you have an echocardiogram in 6 weeks. Echocardiography is a painless test that uses sound waves to create images of your heart. It provides your doctor with information about the size and shape of your heart and how well your heart's chambers and valves are working. This procedure takes approximately one hour. There are no restrictions for this procedure.   Follow-Up: At [facility], you and your health needs are our priority.  As part of our continuing mission to provide you with exceptional heart care, we have created designated Provider Care Teams.  These Care Teams include your primary Cardiologist (physician) and Advanced Practice Providers (APPs -  Physician Assistants and Nurse Practitioners) who all work together to provide you with the care you need, when you need it.  Your next appointment:   3 month(s)  The format for your next appointment:   In Person  Provider:   Fransico Him, MD   Follow up with PharmD in the Hypertension Clinic for uptitration of heart failure meidcations

## 2019-10-12 ENCOUNTER — Other Ambulatory Visit: Payer: Self-pay | Admitting: Nurse Practitioner

## 2019-10-12 NOTE — Telephone Encounter (Signed)
It was noted on refill from 09/24/19 that patient would need an office visit to continue to refill the Baclofen.  Called patient and left VM to call the office, so that an appointment could be scheduled.

## 2019-10-12 NOTE — Telephone Encounter (Signed)
Left message on voicemail for patient to return call when available    Side Note: I did not see Bridget's message prior to patient calling patient

## 2019-10-14 ENCOUNTER — Other Ambulatory Visit: Payer: Medicare Other

## 2019-10-14 ENCOUNTER — Other Ambulatory Visit: Payer: Self-pay

## 2019-10-14 DIAGNOSIS — E785 Hyperlipidemia, unspecified: Secondary | ICD-10-CM

## 2019-10-14 DIAGNOSIS — Z720 Tobacco use: Secondary | ICD-10-CM

## 2019-10-14 DIAGNOSIS — I5022 Chronic systolic (congestive) heart failure: Secondary | ICD-10-CM

## 2019-10-14 DIAGNOSIS — I10 Essential (primary) hypertension: Secondary | ICD-10-CM

## 2019-10-14 DIAGNOSIS — I2510 Atherosclerotic heart disease of native coronary artery without angina pectoris: Secondary | ICD-10-CM

## 2019-10-15 LAB — BASIC METABOLIC PANEL
BUN/Creatinine Ratio: 11 (ref 10–24)
BUN: 13 mg/dL (ref 8–27)
CO2: 24 mmol/L (ref 20–29)
Calcium: 10.5 mg/dL — ABNORMAL HIGH (ref 8.6–10.2)
Chloride: 104 mmol/L (ref 96–106)
Creatinine, Ser: 1.17 mg/dL (ref 0.76–1.27)
GFR calc Af Amer: 74 mL/min/{1.73_m2} (ref >59)
GFR calc non Af Amer: 64 mL/min/{1.73_m2} (ref >59)
Glucose: 147 mg/dL — ABNORMAL HIGH (ref 65–99)
Potassium: 4.6 mmol/L (ref 3.5–5.2)
Sodium: 142 mmol/L (ref 134–144)

## 2019-10-18 ENCOUNTER — Other Ambulatory Visit: Payer: Self-pay | Admitting: Nurse Practitioner

## 2019-10-21 ENCOUNTER — Ambulatory Visit: Payer: Medicare Other

## 2019-10-26 ENCOUNTER — Other Ambulatory Visit: Payer: Self-pay

## 2019-10-29 ENCOUNTER — Telehealth: Payer: Self-pay | Admitting: Emergency Medicine

## 2019-10-29 MED ORDER — POTASSIUM CHLORIDE CRYS ER 20 MEQ PO TBCR: 40.0000 meq | EXTENDED_RELEASE_TABLET | Freq: Every day | ORAL | 3 refills | Status: DC

## 2019-10-29 NOTE — Telephone Encounter (Signed)
*  STAT* If patient is at the pharmacy, call can be transferred to refill team.   1. Which medications need to be refilled? (please list name of each medication and dose if known) potassium chloride SA (KLOR-CON) 20 MEQ tablet and glipiZIDE (GLUCOTROL) 5 MG tablet   2. Which pharmacy/location (including street and city if local pharmacy) is medication to be sent to? [facility]  3. Do they need a 30 day or 90 day supply? 90 day on both

## 2019-10-29 NOTE — Telephone Encounter (Signed)
Called pt and left message informing pt that we were sending in his medication potassium to his pharmacy and that he needed to contact his PCP for a refill for his glipizide, because this is a diabetic medication, he would have to refill with his PCP. If advised the pt that if he has any other heart problems, concerns or questions, to give our office a call back. Confirmation received.

## 2019-11-02 ENCOUNTER — Other Ambulatory Visit: Payer: Self-pay | Admitting: *Deleted

## 2019-11-02 MED ORDER — GLIPIZIDE 5 MG PO TABS: 5.0000 mg | ORAL_TABLET | Freq: Two times a day (BID) | ORAL | 1 refills | Status: DC

## 2019-11-02 NOTE — Telephone Encounter (Signed)
Patient wife called and requested refill. Faxed to pharmacy.

## 2019-11-03 ENCOUNTER — Encounter: Payer: Self-pay | Admitting: General Practice

## 2019-11-11 ENCOUNTER — Other Ambulatory Visit: Payer: Self-pay | Admitting: Cardiology

## 2019-11-12 MED ORDER — ENTRESTO 49-51 MG PO TABS: 1.0000 | ORAL_TABLET | Freq: Two times a day (BID) | ORAL | 3 refills | Status: DC

## 2019-11-15 ENCOUNTER — Telehealth: Payer: Self-pay

## 2019-11-15 ENCOUNTER — Other Ambulatory Visit: Payer: Self-pay

## 2019-11-15 MED ORDER — FUROSEMIDE 20 MG PO TABS: 20.0000 mg | ORAL_TABLET | Freq: Every day | ORAL | 3 refills | Status: DC

## 2019-11-15 NOTE — Telephone Encounter (Signed)
Timothy Hough, LPN, pt's would like to know if you could please help him get his medication cheaper, I called the pharmacy about his Entresto and pharmacy stated that pt's medication was ready to by picked up, but the cost is $155. Could you please see if there is anything that you could help with? Thanks

## 2019-11-15 NOTE — Telephone Encounter (Signed)
**Note De-Identified Anaisa Radi Obfuscation** I called the pt back but got no answer so I left a detailed message on his VM (Ok per [facility]) asking him to call Jeani Hawking back at [phone] concerning the cost of his [name]. I did leave [employer] Pt Asst's phone number in the message so he can call to ask questions concerning applying/his eligibility with them for asst with the cost of his Entresto.

## 2019-11-16 MED ORDER — ENTRESTO 49-51 MG PO TABS: 1.0000 | ORAL_TABLET | Freq: Two times a day (BID) | ORAL | 3 refills | Status: DC

## 2019-11-16 MED ORDER — FUROSEMIDE 20 MG PO TABS: 20.0000 mg | ORAL_TABLET | Freq: Every day | ORAL | 3 refills | Status: DC

## 2019-11-16 NOTE — Telephone Encounter (Signed)
Jahkai is returning Lynn's call. He states the Lasix did not come through per speaking with the pharamcy, but he did pick up the [facility]. Aemon is requesting he be called back on his cell if it is after 10:30 AM, but please call the home phone if it is before.

## 2019-11-16 NOTE — Telephone Encounter (Addendum)
**Note De-Identified Dorann Davidson Obfuscation** The pt states that he needs a refill on his Furosemide sent to [facility]. I advised him that I e-scribed his refill while we were speaking for #90 with 3 refills.  He states that he paid $150 for his Entresto refill yesterday but is unsure of the amount he received (30 or 90 day supply) as he does not have bottle with him at this time.  He states that his wife advised him that the $150 was for a year of Entresto.  I strongly encouraged him to [place] at [phone] to ask questions concerning his eligibility and to request that they mail him an application. He is aware to complete the application and to drop it off at Dr Landis Gandy office and that we will take care of the provider part of the application and will fax to [employer] pt asst Foundation.  He thanked me for calling him back and for telling him about [employer] pt asst foundation.

## 2019-11-17 ENCOUNTER — Other Ambulatory Visit: Payer: Self-pay | Admitting: Cardiology

## 2019-11-17 MED ORDER — FUROSEMIDE 20 MG PO TABS: 20.0000 mg | ORAL_TABLET | Freq: Every day | ORAL | 3 refills | Status: DC

## 2019-11-17 NOTE — Telephone Encounter (Signed)
Pt's medication Furosemide 20 mg tablet was resent to pt's pharmacy as requested. The original Rx was not sent because it was on print. Confirmation received.

## 2019-11-17 NOTE — Telephone Encounter (Signed)
Patient called stating that the pharmacy never received the refill request yesterday for this medication.

## 2019-11-18 ENCOUNTER — Other Ambulatory Visit: Payer: Self-pay

## 2019-11-18 ENCOUNTER — Ambulatory Visit (HOSPITAL_COMMUNITY): Payer: Medicare Other | Attending: Cardiology

## 2019-11-18 DIAGNOSIS — I2510 Atherosclerotic heart disease of native coronary artery without angina pectoris: Secondary | ICD-10-CM

## 2019-11-18 DIAGNOSIS — E785 Hyperlipidemia, unspecified: Secondary | ICD-10-CM

## 2019-11-18 DIAGNOSIS — Z720 Tobacco use: Secondary | ICD-10-CM | POA: Diagnosis not present

## 2019-11-18 DIAGNOSIS — I10 Essential (primary) hypertension: Secondary | ICD-10-CM | POA: Diagnosis not present

## 2019-11-18 DIAGNOSIS — I5022 Chronic systolic (congestive) heart failure: Secondary | ICD-10-CM

## 2019-11-19 ENCOUNTER — Telehealth: Payer: Self-pay

## 2019-11-19 ENCOUNTER — Other Ambulatory Visit: Payer: Self-pay | Admitting: Nurse Practitioner

## 2019-11-19 DIAGNOSIS — I5022 Chronic systolic (congestive) heart failure: Secondary | ICD-10-CM

## 2019-11-19 DIAGNOSIS — I2510 Atherosclerotic heart disease of native coronary artery without angina pectoris: Secondary | ICD-10-CM

## 2019-11-19 NOTE — Telephone Encounter (Signed)
This should have been a short term medication after a fall.

## 2019-11-19 NOTE — Telephone Encounter (Signed)
-----   Message from Dorothy Spark, MD sent at 11/19/2019  7:28 AM EDT -----  There has been no significant change since the last study on 04/15/2019. A cardiac MRI is recommended for further LVEF evaluation and consideration for an ICD.

## 2019-11-19 NOTE — Telephone Encounter (Signed)
15 tablets given on 10/19/2019 please advise if ok to give more refills with a higher dispense number.

## 2019-11-19 NOTE — Telephone Encounter (Signed)
The patient has been notified of the result and verbalized understanding.  All questions (if any) were answered. Cardiac MRI has been ordered.  Antonieta Iba, RN 11/19/2019 11:22 AM

## 2019-11-24 ENCOUNTER — Telehealth: Payer: Self-pay

## 2019-11-24 ENCOUNTER — Other Ambulatory Visit: Payer: Self-pay

## 2019-11-24 MED ORDER — ANORO ELLIPTA 62.5-25 MCG/INH IN AEPB: 1.0000 | INHALATION_SPRAY | Freq: Every day | RESPIRATORY_TRACT | 3 refills | Status: DC

## 2019-11-24 NOTE — Telephone Encounter (Signed)
Patient wants refill on Anoro says he has been trying to get it for 3 weeks is it ok to refill this for him. Please advise

## 2019-11-24 NOTE — Telephone Encounter (Signed)
Yes okay to refill  

## 2019-11-25 ENCOUNTER — Telehealth: Payer: Self-pay | Admitting: Cardiology

## 2019-11-25 NOTE — Telephone Encounter (Signed)
Left message for patient to call and discuss scheduling for the Cardiac MRI ordered by Dr. Radford Pax

## 2019-11-30 ENCOUNTER — Encounter: Payer: Self-pay | Admitting: Cardiology

## 2019-11-30 NOTE — Telephone Encounter (Signed)
Left message for patient regarding appointment for Cardiac MRI schedule Wednesday 01/05/20 at 11:00 am at [facility]---arrival time is 10:30 am--1st floor admissions office.  Will mail information to patient and it is also available in My Chart---ask patient to call with questions or concerns.

## 2019-12-09 ENCOUNTER — Other Ambulatory Visit: Payer: Self-pay

## 2019-12-09 MED ORDER — SPIRONOLACTONE 25 MG PO TABS: 25.0000 mg | ORAL_TABLET | Freq: Every day | ORAL | 2 refills | Status: DC

## 2019-12-09 NOTE — Telephone Encounter (Signed)
Pt's medication was sent to pt's pharmacy as requested. Confirmation received.  °

## 2019-12-23 ENCOUNTER — Other Ambulatory Visit: Payer: Self-pay | Admitting: Nurse Practitioner

## 2019-12-27 NOTE — Telephone Encounter (Signed)
Please advise if this is a refillable medication

## 2019-12-30 ENCOUNTER — Other Ambulatory Visit: Payer: Self-pay | Admitting: Physician Assistant

## 2020-01-03 ENCOUNTER — Telehealth (HOSPITAL_COMMUNITY): Payer: Self-pay | Admitting: Emergency Medicine

## 2020-01-03 NOTE — Telephone Encounter (Signed)
Attempted to call patient regarding upcoming cardiac CT appointment. °Left message on voicemail with name and callback number °Eara Burruel RN Navigator Cardiac Imaging °[facility] °[phone] Office °[phone] Cell ° °

## 2020-01-05 ENCOUNTER — Ambulatory Visit (HOSPITAL_COMMUNITY)
Admission: RE | Admit: 2020-01-05 | Discharge: 2020-01-05 | Disposition: A | Discharge status: Home / Self Care | Payer: Medicare Other | Source: Ambulatory Visit | Attending: Cardiology | Admitting: Cardiology

## 2020-01-05 DIAGNOSIS — I2510 Atherosclerotic heart disease of native coronary artery without angina pectoris: Secondary | ICD-10-CM | POA: Insufficient documentation

## 2020-01-05 DIAGNOSIS — I5022 Chronic systolic (congestive) heart failure: Secondary | ICD-10-CM

## 2020-01-05 MED ORDER — GADOBUTROL 1 MMOL/ML IV SOLN
8.0000 mL | Freq: Once | INTRAVENOUS | Status: AC | PRN
  Administered 2020-01-05: 8 mL via INTRAVENOUS

## 2020-01-05 NOTE — Telephone Encounter (Signed)
LMOM to return call.

## 2020-01-05 NOTE — Telephone Encounter (Signed)
Timothy Lee, Wife called requesting refill on Baclofen.  Wants to know why it was refused. Stated that Janett Billow usually gives patient #15.  Please Advise.

## 2020-01-05 NOTE — Telephone Encounter (Signed)
This was originally given for an acute injury and not meant to be a long term medication, if he is still having issues would recommend making an appt.

## 2020-01-07 NOTE — Addendum Note (Signed)
Addended by: Rafael Bihari A on: 01/07/2020 09:11 AM   Modules accepted: Orders

## 2020-01-07 NOTE — Telephone Encounter (Signed)
Wife, Caren Griffins notified and agreed.

## 2020-01-10 ENCOUNTER — Encounter: Payer: Self-pay | Admitting: Cardiology

## 2020-01-10 NOTE — Progress Notes (Signed)
Cardiology Office Note:    Date:  01/11/2020   ID:  Timothy Lee, DOB June 15, 1950, MRN [id number]  PCP:  Lauree Chandler, NP  Cardiologist:  Fransico Him, MD    Referring MD: Lauree Chandler, NP   Chief Complaint  Patient presents with  . Coronary Artery Disease  . Hypertension  . Sleep Apnea  . Hyperlipidemia  . Cardiomyopathy    History of Present Illness:    Timothy Lee is a 69 y.o. male with a hx of hypertension, DM2, untreated OSA, COPD and longstanding tobacco use.  He was admitted 03/2019 for acute CHF and COPD exacerbation. Echocardiogram showed LVEF at 35 to 40% with global hypokinesis and left ventricular regional wall motion abnormalities. Cath showed 20% prox and 60% mid LAD stenosis and med management was recommended. He was started on guideline directed medical therapy for HF and high intensity statin was started. He was seen back in office by the PA and was doing well.    When I saw him in May he was back smoking about 1/4 to 1/2ppd but was planning to get back off of the cigarettes.  He started smoking due to some stress after an accident. At last OV his Lasix was decreased to 20mg  daily, spiro increased to 25mg  daily and Entresto was increased to 49-51mg  BID.  He recently had a Cardiac MRI to assess for amyloidosis.  This revealed severe LV dysfunction with EF 29% and moderate RV dysfunction with EF 36% and no evidence of LGE or amyloidosis.    He is here today for followup and is doing well.  He has chronic DOE that he attributes to ongoing smoking.  He has cut back to 10 cig daily and has plans to wean off over the next 2 months.  He denies any chest pain or pressure,  PND, orthopnea, LE edema, dizziness, palpitations or syncope. He is compliant with his meds and is tolerating meds with no SE.    Past Medical History:  Diagnosis Date  . Arthritis   . Bladder cancer [facility]) urologist-  dr Kipp Brood  . Borderline diabetes   . Bursitis of both shoulders   .  Diabetes mellitus without complication ([place])   . Disorder of male genital organs, unspecified   . Esophagus disorder    Constricted Esophagus, per New Patient Packet-PSC   . History of basal cell carcinoma (BCC) excision    05/ 2018  right forehead  . Hypertension   . Mild acid reflux no meds  . Nocturia   . OSA (obstructive sleep apnea)    per pt has not used cpap in years  . Renal cyst, left 11/2011   16 mm  . Sleep apnea    Per New Patient Packet-PSC   . Smokers' cough ([place])   . SOB (shortness of breath) on exertion     Past Surgical History:  Procedure Laterality Date  . CYSTOSCOPY WITH BIOPSY  03/13/2012   Procedure: CYSTOSCOPY WITH BIOPSY;  Surgeon: Fredricka Bonine, MD;  Location: [facility];  Service: Urology;  Laterality: N/A;     . ESOPHAGEAL DILATION  05/20/1986   Esophagus Stretch, per New Patient Packet-PSC   . RIGHT/LEFT HEART CATH AND CORONARY ANGIOGRAPHY N/A 04/16/2019   Procedure: RIGHT/LEFT HEART CATH AND CORONARY ANGIOGRAPHY;  Surgeon: Troy Sine, MD;  Location: [place] [facility];  Service: Cardiovascular;  Laterality: N/A;  . TONSILLECTOMY  age 60  . TRANSURETHRAL RESECTION OF BLADDER TUMOR N/A  05/23/2017   Procedure: TRANSURETHRAL RESECTION OF BLADDER TUMOR / (TURBT) WITH INSTILLATION OF EPIRUBICIN;  Surgeon: [name], MD;  Location: [facility];  Service: Urology;  Laterality: N/A;  . UVULECTOMY  05/20/1990   Dr.Byers, per new patient packet   . UVULOPALATOPHARYNGOPLASTY  2005   osa    Current Medications: Current Meds  Medication Sig  . acetaminophen (TYLENOL) 500 MG tablet Take 2 tablets (1,000 mg total) by mouth every 8 (eight) hours as needed for moderate pain.  Marland Kitchen albuterol (VENTOLIN HFA) 108 (90 Base) MCG/ACT inhaler Inhale 2 puffs into the lungs every 6 (six) hours as needed for wheezing or shortness of breath.  Marland Kitchen aspirin 81 MG chewable tablet Chew 1 tablet (81 mg total) by mouth daily.  Marland Kitchen  atorvastatin (LIPITOR) 80 MG tablet Take 1 tablet (80 mg total) by mouth daily at 6 PM.  . furosemide (LASIX) 20 MG tablet Take 1 tablet (20 mg total) by mouth daily.  Marland Kitchen glipiZIDE (GLUCOTROL) 5 MG tablet Take 1 tablet (5 mg total) by mouth 2 (two) times daily.  . metFORMIN (GLUCOPHAGE) 1000 MG tablet TAKE (1) TABLET BY MOUTH TWICE A DAY WITH MEALS (BREAKFAST AND SUPPER).  . metoprolol succinate (TOPROL-XL) 50 MG 24 hr tablet TAKE ONE TABLET BY MOUTH ONCE DAILY.  . Multiple Vitamin (MULTIVITAMIN WITH MINERALS) TABS tablet Take 1 tablet by mouth daily.  . nicotine (NICODERM CQ - DOSED IN MG/24 HOURS) 21 mg/24hr patch Place 1 patch (21 mg total) onto the skin daily.  . potassium chloride SA (KLOR-CON) 20 MEQ tablet Take 2 tablets (40 mEq total) by mouth daily.  . sacubitril-valsartan (ENTRESTO) 49-51 MG Take 1 tablet by mouth 2 (two) times daily.  Marland Kitchen spironolactone (ALDACTONE) 25 MG tablet Take 1 tablet (25 mg total) by mouth daily.  Marland Kitchen umeclidinium-vilanterol (ANORO ELLIPTA) 62.5-25 MCG/INH AEPB Inhale 1 puff into the lungs daily.     Allergies:   Chantix [varenicline tartrate]   Social History   Socioeconomic History  . Marital status: Married    Spouse name: Not on file  . Number of children: Not on file  . Years of education: Not on file  . Highest education level: Not on file  Occupational History  . Not on file  Tobacco Use  . Smoking status: Current Every Day Smoker    Packs/day: 0.10    Years: 47.00    Pack years: 4.70    Types: Cigarettes  . Smokeless tobacco: Former Systems developer    Types: Chew, Snuff  . Tobacco comment: cutting back almost quit   Vaping Use  . Vaping Use: Never used  Substance and Sexual Activity  . Alcohol use: Yes    Comment: very seldom- rare  . Drug use: Yes    Types: Marijuana    Comment: seldom-rare  . Sexual activity: Not Currently  Other Topics Concern  . Not on file  Social History Narrative   Diet: None      Caffeine: Yes      Married, if yes  what year:  Yes, 1990      Do you live in a house, apartment, assisted living, condo, trailer, ect: House, 3 stories, 4 persons      Pets: 2 dogs, 2 cats       Highest level of education: Some Facilities manager profession: Clinical cytogeneticist       Exercise: Some         Living Will: Yes   DNR:  Yes   POA/HPOA: Yes      Functional Status:   Do you have difficulty bathing or dressing yourself? No   Do you have difficulty preparing food or eating? No   Do you have difficulty managing your medications? No   Do you have difficulty managing your finances? No   Do you have difficulty affording your medications? No   Social Determinants of Health   Financial Resource Strain:   . Difficulty of Paying Living Expenses: Not on file  Food Insecurity:   . Worried About Charity fundraiser in the Last Year: Not on file  . Ran Out of Food in the Last Year: Not on file  Transportation Needs:   . Lack of Transportation (Medical): Not on file  . Lack of Transportation (Non-Medical): Not on file  Physical Activity:   . Days of Exercise per Week: Not on file  . Minutes of Exercise per Session: Not on file  Stress:   . Feeling of Stress : Not on file  Social Connections:   . Frequency of Communication with Friends and Family: Not on file  . Frequency of Social Gatherings with Friends and Family: Not on file  . Attends Religious Services: Not on file  . Active Member of Clubs or Organizations: Not on file  . Attends Archivist Meetings: Not on file  . Marital Status: Not on file     Family History: The patient's family history includes ADD / ADHD in his daughter and son; Arthritis in his mother; Cancer in his maternal grandmother; Dementia in his mother; Diabetes in his father, mother, and sister; Hypertension in his mother and sister; Migraines in his daughter; Multiple sclerosis in his father; Stroke in his mother.  ROS:   Please see the history of present illness.    ROS    All other systems reviewed and negative.   EKGs/Labs/Other Studies Reviewed:    The following studies were reviewed today: 2D echo, cardiac cath, Cardiac MRI  EKG:  EKG is not ordered today.    Recent Labs: 04/14/2019: B Natriuretic Peptide 288.0 04/15/2019: TSH 0.303 04/18/2019: Magnesium 2.2 05/18/2019: ALT 23; Hemoglobin 15.1; Platelets 220 10/14/2019: BUN 13; Creatinine, Ser 1.17; Potassium 4.6; Sodium 142   Recent Lipid Panel    Component Value Date/Time   CHOL 97 05/18/2019 0825   TRIG 82 05/18/2019 0825   HDL 52 05/18/2019 0825   CHOLHDL 1.9 05/18/2019 0825   VLDL 10 04/16/2019 0420   LDLCALC 29 05/18/2019 0825    Physical Exam:    VS:  BP 126/80   Pulse 88   Ht 6' (1.829 m)   Wt 259 lb (117.5 kg)   SpO2 94%   BMI 35.13 kg/m     Wt Readings from Last 3 Encounters:  01/11/20 259 lb (117.5 kg)  10/07/19 262 lb (118.8 kg)  08/25/19 257 lb 12.8 oz (116.9 kg)     GEN: Well nourished, well developed in no acute distress HEENT: Normal NECK: No JVD; No carotid bruits LYMPHATICS: No lymphadenopathy CARDIAC:RRR, no murmurs, rubs, gallops RESPIRATORY:  Clear to auscultation without rales, wheezing or rhonchi  ABDOMEN: Soft, non-tender, non-distended MUSCULOSKELETAL:  No edema; No deformity  SKIN: Warm and dry NEUROLOGIC:  Alert and oriented x 3 PSYCHIATRIC:  Normal affect    ASSESSMENT:    1. Coronary artery disease involving native coronary artery of native heart without angina pectoris   2. DCM (dilated cardiomyopathy) ([place])   3. Chronic systolic CHF (congestive  heart failure) ([place])   4. Essential hypertension   5. Hyperlipidemia LDL goal <70   6. Tobacco abuse   7. OSA (obstructive sleep apnea)    PLAN:    In order of problems listed above:  1.  ASCAD -Cath 03/2019 with 20% prox and 60% mid LAD stenosis >>medical management recommended -he has not had any anginal sx -continue ASA, BB and statin  2.  Mixed ischemic/nonischemic DCM/Chronic  systolic CHF -2D echo 75/1025 with EF 35-40%  -Cardiac MRI with moderate to severe LV dysfunction EF 29% -LV dysfunction out of proportion to CAD -he is NYHA class 2a -his weight is stable and has no significant SOB or LE edema -continue on Toprol XL 50mg  daily, spiro 25mg  daily -increase Entresto to 97-103mg  BID -check BMET in 2 weeks -followup with PharmD in 2 weeks and if BP is stable then titrate BB up further -repeat 2D echo in 8 weeks and if EF still < 35% will need referral to EP for ICD.  QRS duration on last EKG was 173ms so would not be a candidate for CRT  3.  HTN -BP controlled on exam -SCR 1.17 in May 2021 -continue Toprol XL 50mg  daily, Entresto  and Spiro   4.  HLD -LDL goal < 70 -LDL 29 in Dec 2020 -continue Atorvastatin 80mg  daily  5.  Tobacco abuse -he is slowly cutting back and plans to be off of cigarettes in 2 months  6.  OSA -he has not used CPAP in years -will get split night sleep study -discussed the role OSA plays in Heart failure and I think he would benefit from PAP therapy    Medication Adjustments/Labs and Tests Ordered: Current medicines are reviewed at length with the patient today.  Concerns regarding medicines are outlined above.  No orders of the defined types were placed in this encounter.  No orders of the defined types were placed in this encounter.   Signed, Fransico Him, MD  01/11/2020 11:41 AM    [place]

## 2020-01-11 ENCOUNTER — Ambulatory Visit: Payer: Medicare Other | Admitting: Cardiology

## 2020-01-11 ENCOUNTER — Other Ambulatory Visit: Payer: Self-pay

## 2020-01-11 VITALS — BP 126/80 | HR 88 | Ht 72.0 in | Wt 259.0 lb

## 2020-01-11 DIAGNOSIS — Z720 Tobacco use: Secondary | ICD-10-CM

## 2020-01-11 DIAGNOSIS — I10 Essential (primary) hypertension: Secondary | ICD-10-CM | POA: Diagnosis not present

## 2020-01-11 DIAGNOSIS — I420 Dilated cardiomyopathy: Secondary | ICD-10-CM

## 2020-01-11 DIAGNOSIS — E785 Hyperlipidemia, unspecified: Secondary | ICD-10-CM

## 2020-01-11 DIAGNOSIS — I5022 Chronic systolic (congestive) heart failure: Secondary | ICD-10-CM

## 2020-01-11 DIAGNOSIS — G4733 Obstructive sleep apnea (adult) (pediatric): Secondary | ICD-10-CM

## 2020-01-11 DIAGNOSIS — I2510 Atherosclerotic heart disease of native coronary artery without angina pectoris: Secondary | ICD-10-CM | POA: Diagnosis not present

## 2020-01-11 MED ORDER — ENTRESTO 97-103 MG PO TABS: 1.0000 | ORAL_TABLET | Freq: Two times a day (BID) | ORAL | 11 refills | Status: DC

## 2020-01-11 NOTE — Patient Instructions (Addendum)
Medication Instructions:  Your physician has recommended you make the following change in your medication:  1) INCREASE Entresto to 97-103 twice daily   *If you need a refill on your cardiac medications before your next appointment, please call your pharmacy*   Lab Work: BMET in 2 weeks If you have labs (blood work) drawn today and your tests are completely normal, you will receive your results only by: Marland Kitchen MyChart Message (if you have MyChart) OR . A paper copy in the mail If you have any lab test that is abnormal or we need to change your treatment, we will call you to review the results.   Testing/Procedures: Your physician has requested that you have an echocardiogram in 8 weeks. Echocardiography is a painless test that uses sound waves to create images of your heart. It provides your doctor with information about the size and shape of your heart and how well your heart's chambers and valves are working. This procedure takes approximately one hour. There are no restrictions for this procedure.  Follow-Up: At [facility], you and your health needs are our priority.  As part of our continuing mission to provide you with exceptional heart care, we have created designated Provider Care Teams.  These Care Teams include your primary Cardiologist (physician) and Advanced Practice Providers (APPs -  Physician Assistants and Nurse Practitioners) who all work together to provide you with the care you need, when you need it.  Your next appointment:   3 month(s)  The format for your next appointment:   In Person  Provider:    Fransico Him, MD   You have been referred to see our Pharm D in 2 weeks. BMET on the same day.

## 2020-01-11 NOTE — Addendum Note (Signed)
Addended by: Antonieta Iba on: 01/11/2020 11:47 AM   Modules accepted: Orders

## 2020-01-15 ENCOUNTER — Other Ambulatory Visit: Payer: Self-pay | Admitting: Nurse Practitioner

## 2020-01-15 DIAGNOSIS — E1165 Type 2 diabetes mellitus with hyperglycemia: Secondary | ICD-10-CM

## 2020-01-28 ENCOUNTER — Other Ambulatory Visit: Payer: Self-pay

## 2020-01-28 ENCOUNTER — Ambulatory Visit (INDEPENDENT_AMBULATORY_CARE_PROVIDER_SITE_OTHER): Payer: Medicare Other | Admitting: Pharmacist

## 2020-01-28 ENCOUNTER — Other Ambulatory Visit: Payer: Medicare Other | Admitting: *Deleted

## 2020-01-28 DIAGNOSIS — I5022 Chronic systolic (congestive) heart failure: Secondary | ICD-10-CM | POA: Insufficient documentation

## 2020-01-28 DIAGNOSIS — I2510 Atherosclerotic heart disease of native coronary artery without angina pectoris: Secondary | ICD-10-CM

## 2020-01-28 LAB — BASIC METABOLIC PANEL
BUN/Creatinine Ratio: 12 (ref 10–24)
BUN: 14 mg/dL (ref 8–27)
CO2: 27 mmol/L (ref 20–29)
Calcium: 10.4 mg/dL — ABNORMAL HIGH (ref 8.6–10.2)
Chloride: 99 mmol/L (ref 96–106)
Creatinine, Ser: 1.16 mg/dL (ref 0.76–1.27)
GFR calc Af Amer: 74 mL/min/{1.73_m2} (ref >59)
GFR calc non Af Amer: 64 mL/min/{1.73_m2} (ref >59)
Glucose: 151 mg/dL — ABNORMAL HIGH (ref 65–99)
Potassium: 4.6 mmol/L (ref 3.5–5.2)
Sodium: 138 mmol/L (ref 134–144)

## 2020-01-28 MED ORDER — METOPROLOL SUCCINATE ER 100 MG PO TB24: 100.0000 mg | ORAL_TABLET | Freq: Every day | ORAL | 3 refills | Status: DC

## 2020-01-28 NOTE — Patient Instructions (Addendum)
It was nice to meet you!  Please increase your Entresto to 97/103mg  twice a day when you run out of your Entresto 49/51mg  twice a day.  Please increase your metoprolol to 100mg  daily. You may take 2 of your metoprolol 50mg  tablets daily until you run out.  Please check your blood pressure once a day  Call us with any questions or concerns at [phone].

## 2020-01-28 NOTE — Progress Notes (Signed)
Patient ID: DELMOS Lee                 DOB: Feb 20, 1951                      MRN: [id number]     HPI: Timothy Lee is a 69 y.o. male referred by Dr. Radford Pax to pharmacy clinic for HF medication management. PMH is significant for hypertension, DM2, untreated OSA, COPD, longstanding tobacco use, CHF and CAD. Most recent LVEF 29% on 01/05/20 via cardiac MRI.  Today he presents to pharmacy clinic for further medication titration. At last visit with MD his [name] was increased to 97/103mg  BID. However, his wife said since the medication was expensive she did not want to pick up the higher dose until he finished the lower dose. Therefore he is still on the 49/51mg  dose. His wife makes his pill boxes, but he thinks he should start the higher dose in a week. Symptomatically, she is feeling good, denies dizziness, lightheadedness, and fatigue. No chest pain or palpitations. Sometimes is SOB but admits that he has smoked for a long time and has the beinging of what they tell him is COPD. Able to complete all ADLs. Activity level good. He sometimes checks his weight at home. States it is stable No LEE, PND, or orthopnea. Appetite has been good. He adheres to a fairly low-salt diet.  Did discuss patient assistance with patient. He states he makes way too much. Is ok paying the copay for Entresto. Has cut back to 6 cigarettes per day. Does not drink anymore. Trying to limit his soda. Does eat a lot of potato chips. Doesn't check his blood pressure often. Has had two episodes on hypotension in the last year, but it seems as though it was heat related as patient was working in his trailer, was about 100 degrees. No symptoms of hypotension otherwise.  Patient does not exercise. His job does not have consistent hours. 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 He states he knows he needs to start exercising.  Current CHF meds: Entresto 97/103mg  twice  daily, spironolactone 25mg  daily, furosemide 20mg  daily, metoprolol succinate 50mg  daily Previously tried:  BP goal: <130/80  Family History: The patient's family history includes ADD / ADHD in his daughter and son; Arthritis in his mother; Cancer in his maternal grandmother; Dementia in his mother; Diabetes in his father, mother, and sister; Hypertension in his mother and sister; Migraines in his daughter; Multiple sclerosis in his father; Stroke in his mother.  Social History: 6 cig per day/ quite drinking  Diet: occassional soda Does not use salt shaker Rinse his canned food  Exercise: none other than lifting heavy objects for work  Home BP readings: none  Wt Readings from Last 3 Encounters:  01/11/20 259 lb (117.5 kg)  10/07/19 262 lb (118.8 kg)  08/25/19 257 lb 12.8 oz (116.9 kg)   BP Readings from Last 3 Encounters:  01/11/20 126/80  10/07/19 (!) 142/90  08/25/19 122/78   Pulse Readings from Last 3 Encounters:  01/11/20 88  10/07/19 99  08/25/19 90    Renal function: CrCl cannot be calculated (Patient's most recent lab result is older than the maximum 21 days allowed.).  Past Medical History:  Diagnosis Date   Arthritis    Bladder cancer [facility]) urologist-  dr Kipp Brood   Borderline diabetes    Bursitis of both shoulders  Diabetes mellitus without complication ([place])    Disorder of male genital organs, unspecified    Esophagus disorder    Constricted Esophagus, per New Patient Packet-PSC    History of basal cell carcinoma (BCC) excision    05/ 2018  right forehead   Hypertension    Mild acid reflux no meds   Nocturia    OSA (obstructive sleep apnea)    per pt has not used cpap in years   Renal cyst, left 11/2011   16 mm   Sleep apnea    Per New Patient Packet-PSC    Smokers' cough ([facility])    SOB (shortness of breath) on exertion     Current Outpatient Medications on File Prior to Visit  Medication Sig Dispense Refill   acetaminophen  (TYLENOL) 500 MG tablet Take 2 tablets (1,000 mg total) by mouth every 8 (eight) hours as needed for moderate pain. 30 tablet 0   albuterol (VENTOLIN HFA) 108 (90 Base) MCG/ACT inhaler Inhale 2 puffs into the lungs every 6 (six) hours as needed for wheezing or shortness of breath. 8 g 1   aspirin 81 MG chewable tablet Chew 1 tablet (81 mg total) by mouth daily. 90 tablet 0   atorvastatin (LIPITOR) 80 MG tablet Take 1 tablet (80 mg total) by mouth daily at 6 PM. 90 tablet 1   furosemide (LASIX) 20 MG tablet Take 1 tablet (20 mg total) by mouth daily. 90 tablet 3   glipiZIDE (GLUCOTROL) 5 MG tablet Take 1 tablet (5 mg total) by mouth 2 (two) times daily. 180 tablet 1   metFORMIN (GLUCOPHAGE) 1000 MG tablet TAKE (1) TABLET BY MOUTH TWICE A DAY WITH MEALS (BREAKFAST AND SUPPER). 180 tablet 1   metoprolol succinate (TOPROL-XL) 50 MG 24 hr tablet TAKE ONE TABLET BY MOUTH ONCE DAILY. 90 tablet 0   Multiple Vitamin (MULTIVITAMIN WITH MINERALS) TABS tablet Take 1 tablet by mouth daily. 90 tablet 1   nicotine (NICODERM CQ - DOSED IN MG/24 HOURS) 21 mg/24hr patch Place 1 patch (21 mg total) onto the skin daily. 28 patch 0   potassium chloride SA (KLOR-CON) 20 MEQ tablet Take 2 tablets (40 mEq total) by mouth daily. 180 tablet 3   sacubitril-valsartan (ENTRESTO) 97-103 MG Take 1 tablet by mouth 2 (two) times daily. 60 tablet 11   spironolactone (ALDACTONE) 25 MG tablet Take 1 tablet (25 mg total) by mouth daily. 90 tablet 2   umeclidinium-vilanterol (ANORO ELLIPTA) 62.5-25 MCG/INH AEPB Inhale 1 puff into the lungs daily. 1 each 3   No current facility-administered medications on file prior to visit.    Allergies  Allergen Reactions   Chantix [Varenicline Tartrate]     Aggression      Assessment/Plan:  1. CHF - Patient has not yet increased his Entresto. Will increase once he runs out of the 49/51mg  which should be in the next week. Labs drawn today are not reflective of the higher dose.  HR 86 in clinic today. Will increase metoprolol succinate to 100mg  daily. Follow up in clinic for BP check and BMET in 3 weeks. I have asked patient to check his blood pressure daily for me and to start exercising, even if its just walking 15 min/day to start. We talked about the importance of exercise and avoiding excess salt and eliminating soda. He is cutting back on smoking. I congratulated him on his success and encouraged him to stop completley.  Thank you,  Ramond Dial, Pharm.D, BCPS, CPP [facility]  [address], [place], [place] [address]  Phone: [phone]; Fax: [phone]

## 2020-02-17 ENCOUNTER — Ambulatory Visit: Payer: Medicare Other

## 2020-02-22 ENCOUNTER — Other Ambulatory Visit: Payer: Medicare Other

## 2020-02-23 ENCOUNTER — Telehealth: Payer: Self-pay | Admitting: Nurse Practitioner

## 2020-02-23 NOTE — Telephone Encounter (Signed)
I called the patient to try and resch his missed lab appt on 02/22/20. I left the pt a vm to call back and resch

## 2020-02-25 ENCOUNTER — Ambulatory Visit: Payer: Medicare Other | Admitting: Nurse Practitioner

## 2020-03-06 ENCOUNTER — Telehealth (HOSPITAL_COMMUNITY): Payer: Self-pay | Admitting: Cardiology

## 2020-03-06 ENCOUNTER — Other Ambulatory Visit (HOSPITAL_COMMUNITY): Payer: Medicare Other

## 2020-03-06 ENCOUNTER — Encounter: Payer: Self-pay | Admitting: Cardiology

## 2020-03-06 NOTE — Telephone Encounter (Signed)
Please call patient to get these rescheduled

## 2020-03-06 NOTE — Telephone Encounter (Signed)
Patient No Showed Echocardiogram 03/06/2020 and called and states he could not come due to he had to go to [place] to take care of [place]. He also cancelled appointment for the Pharmacy.  Order will be removed from the [place] and if patient calls back to reschedule we will reinstate the order/ Thank you.

## 2020-03-06 NOTE — Progress Notes (Unsigned)
Patient ID: Timothy Lee, male   DOB: 1950/11/24, 69 y.o.   MRN: [id number]   Verified appointment "no show" status with Elmo Putt at 6:81EX.

## 2020-03-07 ENCOUNTER — Ambulatory Visit: Payer: Medicare Other

## 2020-03-07 NOTE — Telephone Encounter (Signed)
Left message for patient to call back  

## 2020-03-29 ENCOUNTER — Ambulatory Visit: Payer: Medicare Other | Admitting: Cardiology

## 2020-03-29 ENCOUNTER — Encounter: Payer: Self-pay | Admitting: Cardiology

## 2020-03-29 ENCOUNTER — Other Ambulatory Visit: Payer: Self-pay

## 2020-03-29 ENCOUNTER — Telehealth: Payer: Self-pay | Admitting: *Deleted

## 2020-03-29 VITALS — BP 130/84 | HR 86 | Ht 72.0 in | Wt 260.4 lb

## 2020-03-29 DIAGNOSIS — I10 Essential (primary) hypertension: Secondary | ICD-10-CM | POA: Diagnosis not present

## 2020-03-29 DIAGNOSIS — E785 Hyperlipidemia, unspecified: Secondary | ICD-10-CM | POA: Insufficient documentation

## 2020-03-29 DIAGNOSIS — I5022 Chronic systolic (congestive) heart failure: Secondary | ICD-10-CM | POA: Diagnosis not present

## 2020-03-29 DIAGNOSIS — Z720 Tobacco use: Secondary | ICD-10-CM | POA: Diagnosis not present

## 2020-03-29 DIAGNOSIS — I2510 Atherosclerotic heart disease of native coronary artery without angina pectoris: Secondary | ICD-10-CM

## 2020-03-29 DIAGNOSIS — E7800 Pure hypercholesterolemia, unspecified: Secondary | ICD-10-CM

## 2020-03-29 DIAGNOSIS — I25119 Atherosclerotic heart disease of native coronary artery with unspecified angina pectoris: Secondary | ICD-10-CM | POA: Insufficient documentation

## 2020-03-29 DIAGNOSIS — G4733 Obstructive sleep apnea (adult) (pediatric): Secondary | ICD-10-CM

## 2020-03-29 MED ORDER — METOPROLOL SUCCINATE ER 25 MG PO TB24: 25.0000 mg | ORAL_TABLET | Freq: Every day | ORAL | 3 refills | Status: DC

## 2020-03-29 NOTE — Patient Instructions (Signed)
Medication Instructions:  Your physician has recommended you make the following change in your medication:  1) INCREASE Toprol XL to 100 mg in the AM and 25 mg in PM  *If you need a refill on your cardiac medications before your next appointment, please call your pharmacy*  Lab Work: Fasting lipids and ALT in December 2021 If you have labs (blood work) drawn today and your tests are completely normal, you will receive your results only by: Marland Kitchen MyChart Message (if you have MyChart) OR . A paper copy in the mail If you have any lab test that is abnormal or we need to change your treatment, we will call you to review the results.   Testing/Procedures: Your physician has requested that you have an echocardiogram. Echocardiography is a painless test that uses sound waves to create images of your heart. It provides your doctor with information about the size and shape of your heart and how well your heart's chambers and valves are working. This procedure takes approximately one hour. There are no restrictions for this procedure.  Your physician has recommended that you have a sleep study. This test records several body functions during sleep, including: brain activity, eye movement, oxygen and carbon dioxide blood levels, heart rate and rhythm, breathing rate and rhythm, the flow of air through your mouth and nose, snoring, body muscle movements, and chest and belly movement.  Follow-Up: At [facility], you and your health needs are our priority.  As part of our continuing mission to provide you with exceptional heart care, we have created designated Provider Care Teams.  These Care Teams include your primary Cardiologist (physician) and Advanced Practice Providers (APPs -  Physician Assistants and Nurse Practitioners) who all work together to provide you with the care you need, when you need it.  Your next appointment:   6 month(s)  The format for your next appointment:   In Person  Provider:    You may see Fransico Him, MD or one of the following Advanced Practice Providers on your designated Care Team:    Melina Copa, PA-C  Ermalinda Barrios, PA-C

## 2020-03-29 NOTE — Progress Notes (Signed)
Cardiology Office Note:    Date:  03/29/2020   ID:  Timothy Lee, DOB August 06, 1950, MRN [id number]  PCP:  Lauree Chandler, NP  Cardiologist:  Fransico Him, MD    Referring MD: Lauree Chandler, NP   Chief Complaint  Patient presents with  . Coronary Artery Disease  . Congestive Heart Failure  . Hypertension  . Hyperlipidemia  . Sleep Apnea    History of Present Illness:    Timothy Lee is a 69 y.o. male with a hx of hypertension, DM2, untreated OSA, COPD and longstanding tobacco use.  He was admitted 03/2019 for acute CHF and COPD exacerbation. Echocardiogram showed LVEF at 35 to 40% with global hypokinesis and left ventricular regional wall motion abnormalities. Cath showed 20% prox and 60% mid LAD stenosis and med management was recommended. He was started on guideline directed medical therapy for HF and high intensity statin was started. He was seen back in office by the PA and was doing well.    When I saw him in May he was back smoking about 1/4 to 1/2ppd but was planning to get back off of the cigarettes.  He started smoking due to some stress after an accident. At last OV his Lasix was decreased to 20mg  daily, spiro increased to 25mg  daily and Entresto was increased to 49-51mg  BID.  He recently had a Cardiac MRI to assess for amyloidosis.  This revealed severe LV dysfunction with EF 29% and moderate RV dysfunction with EF 36% and no evidence of LGE or amyloidosis.  At his last OV his [name] was increased to 97-103mg  BID.  He is here today for followup and is doing well.  He denies any anginal chest pain or pressure, SOB, DOE, PND, orthopnea, LE edema, dizziness, palpitations or syncope. He has occasional sharp pin point pain over left breast that improves with belching and rubbing his chest wall.  He is compliant with his meds and is tolerating meds with no SE.  Unfortunately he has been stressed and his number of cigarettes had gone up but he is starting to cut back.    Past Medical History:  Diagnosis Date  . Arthritis   . Bladder cancer [facility]) urologist-  dr Kipp Brood  . Borderline diabetes   . Bursitis of both shoulders   . CAD (coronary artery disease), native coronary artery    Cath 03/2019 with 20% prox and 60% mid LAD stenosis >>medical management recommended  . Chronic systolic CHF (congestive heart failure), NYHA class 2 (HCC)    EF 29% by cMRI  . Diabetes mellitus without complication ([place])   . Disorder of male genital organs, unspecified   . Esophagus disorder    Constricted Esophagus, per New Patient Packet-PSC   . History of basal cell carcinoma (BCC) excision    05/ 2018  right forehead  . HLD (hyperlipidemia)   . Hypertension   . Mild acid reflux no meds  . Nocturia   . OSA (obstructive sleep apnea)    per pt has not used cpap in years  . Renal cyst, left 11/2011   16 mm  . Sleep apnea    Per New Patient Packet-PSC   . Smokers' cough ([place])   . SOB (shortness of breath) on exertion     Past Surgical History:  Procedure Laterality Date  . CYSTOSCOPY WITH BIOPSY  03/13/2012   Procedure: CYSTOSCOPY WITH BIOPSY;  Surgeon: Fredricka Bonine, MD;  Location: [facility];  Service: Urology;  Laterality: N/A;     . ESOPHAGEAL DILATION  05/20/1986   Esophagus Stretch, per New Patient Packet-PSC   . RIGHT/LEFT HEART CATH AND CORONARY ANGIOGRAPHY N/A 04/16/2019   Procedure: RIGHT/LEFT HEART CATH AND CORONARY ANGIOGRAPHY;  Surgeon: Troy Sine, MD;  Location: [facility];  Service: Cardiovascular;  Laterality: N/A;  . TONSILLECTOMY  age 11  . TRANSURETHRAL RESECTION OF BLADDER TUMOR N/A 05/23/2017   Procedure: TRANSURETHRAL RESECTION OF BLADDER TUMOR / (TURBT) WITH INSTILLATION OF EPIRUBICIN;  Surgeon: [name], MD;  Location: [facility];  Service: Urology;  Laterality: N/A;  . UVULECTOMY  05/20/1990   Dr.Byers, per new patient packet   . UVULOPALATOPHARYNGOPLASTY  2005   osa     Current Medications: Current Meds  Medication Sig  . acetaminophen (TYLENOL) 500 MG tablet Take 2 tablets (1,000 mg total) by mouth every 8 (eight) hours as needed for moderate pain.  Marland Kitchen albuterol (VENTOLIN HFA) 108 (90 Base) MCG/ACT inhaler Inhale 2 puffs into the lungs every 6 (six) hours as needed for wheezing or shortness of breath.  Marland Kitchen aspirin 81 MG chewable tablet Chew 1 tablet (81 mg total) by mouth daily.  Marland Kitchen atorvastatin (LIPITOR) 80 MG tablet Take 1 tablet (80 mg total) by mouth daily at 6 PM.  . furosemide (LASIX) 20 MG tablet Take 1 tablet (20 mg total) by mouth daily.  . metFORMIN (GLUCOPHAGE) 1000 MG tablet TAKE (1) TABLET BY MOUTH TWICE A DAY WITH MEALS (BREAKFAST AND SUPPER).  . metoprolol succinate (TOPROL XL) 100 MG 24 hr tablet Take 1 tablet (100 mg total) by mouth daily. Take with or immediately following a meal.  . Multiple Vitamin (MULTIVITAMIN WITH MINERALS) TABS tablet Take 1 tablet by mouth daily.  . nicotine (NICODERM CQ - DOSED IN MG/24 HOURS) 21 mg/24hr patch Place 1 patch (21 mg total) onto the skin daily.  . potassium chloride SA (KLOR-CON) 20 MEQ tablet Take 2 tablets (40 mEq total) by mouth daily.  . sacubitril-valsartan (ENTRESTO) 97-103 MG Take 1 tablet by mouth 2 (two) times daily.  Marland Kitchen spironolactone (ALDACTONE) 25 MG tablet Take 1 tablet (25 mg total) by mouth daily.  Marland Kitchen umeclidinium-vilanterol (ANORO ELLIPTA) 62.5-25 MCG/INH AEPB Inhale 1 puff into the lungs daily.     Allergies:   Chantix [varenicline tartrate]   Social History   Socioeconomic History  . Marital status: Married    Spouse name: Not on file  . Number of children: Not on file  . Years of education: Not on file  . Highest education level: Not on file  Occupational History  . Not on file  Tobacco Use  . Smoking status: Current Every Day Smoker    Packs/day: 0.10    Years: 47.00    Pack years: 4.70    Types: Cigarettes  . Smokeless tobacco: Former Systems developer    Types: Chew, Snuff  .  Tobacco comment: cutting back almost quit   Vaping Use  . Vaping Use: Never used  Substance and Sexual Activity  . Alcohol use: Yes    Comment: very seldom- rare  . Drug use: Yes    Types: Marijuana    Comment: seldom-rare  . Sexual activity: Not Currently  Other Topics Concern  . Not on file  Social History Narrative   Diet: None      Caffeine: Yes      Married, if yes what year:  Yes, 1990      Do you live in a house, apartment,  assisted living, condo, trailer, ect: House, 3 stories, 4 persons      Pets: 2 dogs, 2 cats       Highest level of education: Some Facilities manager profession: Clinical cytogeneticist       Exercise: Some         Living Will: Yes   DNR: Yes   POA/HPOA: Yes      Functional Status:   Do you have difficulty bathing or dressing yourself? No   Do you have difficulty preparing food or eating? No   Do you have difficulty managing your medications? No   Do you have difficulty managing your finances? No   Do you have difficulty affording your medications? No   Social Determinants of Health   Financial Resource Strain:   . Difficulty of Paying Living Expenses: Not on file  Food Insecurity:   . Worried About Charity fundraiser in the Last Year: Not on file  . Ran Out of Food in the Last Year: Not on file  Transportation Needs:   . Lack of Transportation (Medical): Not on file  . Lack of Transportation (Non-Medical): Not on file  Physical Activity:   . Days of Exercise per Week: Not on file  . Minutes of Exercise per Session: Not on file  Stress:   . Feeling of Stress : Not on file  Social Connections:   . Frequency of Communication with Friends and Family: Not on file  . Frequency of Social Gatherings with Friends and Family: Not on file  . Attends Religious Services: Not on file  . Active Member of Clubs or Organizations: Not on file  . Attends Archivist Meetings: Not on file  . Marital Status: Not on file     Family  History: The patient's family history includes ADD / ADHD in his daughter and son; Arthritis in his mother; Cancer in his maternal grandmother; Dementia in his mother; Diabetes in his father, mother, and sister; Hypertension in his mother and sister; Migraines in his daughter; Multiple sclerosis in his father; Stroke in his mother.  ROS:   Please see the history of present illness.    ROS  All other systems reviewed and negative.   EKGs/Labs/Other Studies Reviewed:    The following studies were reviewed today: 2D echo, cardiac cath, Cardiac MRI  EKG:  EKG is ordered today and showed NSR with PVCs, inferior infarct and low voltage QRS in limb leads  Recent Labs: 04/14/2019: B Natriuretic Peptide 288.0 04/15/2019: TSH 0.303 04/18/2019: Magnesium 2.2 05/18/2019: ALT 23; Hemoglobin 15.1; Platelets 220 01/28/2020: BUN 14; Creatinine, Ser 1.16; Potassium 4.6; Sodium 138   Recent Lipid Panel    Component Value Date/Time   CHOL 97 05/18/2019 0825   TRIG 82 05/18/2019 0825   HDL 52 05/18/2019 0825   CHOLHDL 1.9 05/18/2019 0825   VLDL 10 04/16/2019 0420   LDLCALC 29 05/18/2019 0825    Physical Exam:    VS:  BP 130/84   Pulse 86   Ht 6' (1.829 m)   Wt 260 lb 6.4 oz (118.1 kg)   BMI 35.32 kg/m     Wt Readings from Last 3 Encounters:  03/29/20 260 lb 6.4 oz (118.1 kg)  01/11/20 259 lb (117.5 kg)  10/07/19 262 lb (118.8 kg)     GEN: Well nourished, well developed in no acute distress HEENT: Normal NECK: No JVD; No carotid bruits LYMPHATICS: No lymphadenopathy CARDIAC:RRR, no murmurs, rubs, gallops RESPIRATORY:  Clear to auscultation without rales, wheezing or rhonchi  ABDOMEN: Soft, non-tender, non-distended MUSCULOSKELETAL:  No edema; No deformity  SKIN: Warm and dry NEUROLOGIC:  Alert and oriented x 3 PSYCHIATRIC:  Normal affect    ASSESSMENT:    1. Coronary artery disease involving native coronary artery of native heart without angina pectoris   2. Chronic systolic  heart failure ([place])   3. Essential hypertension   4. Hyperlipidemia LDL goal <70   5. Tobacco abuse   6. OSA (obstructive sleep apnea)   7. Pure hypercholesterolemia   8. Chronic systolic CHF (congestive heart failure), NYHA class 2 (HCC)    PLAN:    In order of problems listed above:  1.  ASCAD -Cath 03/2019 with 20% prox and 60% mid LAD stenosis >>medical management recommended -he has not had any anginal sx -continue ASA, BB and statin  2.  Mixed ischemic/nonischemic DCM/Chronic systolic CHF -2D echo 50/5697 with EF 35-40%  -Cardiac MRI with moderate to severe LV dysfunction EF 29% -LV dysfunction out of proportion to CAD -he is NYHA class 2a -his weight is stable and has no significant SOB or LE edema -continue on Toprol XL 100mg  daily, spiro 25mg  daily and Entresto 97-103mg  BID -his HR is still above 80 so I will add Toprol XL 25mg  nightly -SCr stable at 1.16 in Sept -repeat 2D echo  and if EF still < 35% will need referral to EP for ICD.  QRS duration on last EKG was 181ms so would not be a candidate for CRT  3.  HTN -BP controlled on exam -SCr 1.16 in Sept 2021 -continue Toprol XL 100mg  daily, Entresto  and Arlyce Harman  -adding extra Toprol XL 25mg  at bedtime  4.  HLD -LDL goal < 70 -LDL 29 in Dec 2020 -repeat FLP and ALT -continue Atorvastatin 80mg  daily  5.  Tobacco abuse -he is slowly cutting back and plans to be off of cigarettes in 2 months  6.  OSA -he has not used CPAP in years -will get split night sleep study -I again discussed and stressed the role OSA plays in Heart failure and I think he would benefit from PAP therapy and encouraged him to follow through with split night sleep study    Medication Adjustments/Labs and Tests Ordered: Current medicines are reviewed at length with the patient today.  Concerns regarding medicines are outlined above.  No orders of the defined types were placed in this encounter.  No orders of the defined types were placed  in this encounter.   Signed, Fransico Him, MD  03/29/2020 8:28 AM    [place]

## 2020-03-29 NOTE — Telephone Encounter (Signed)
-----   Message from Antonieta Iba, RN sent at 03/29/2020  8:52 AM EST ----- Regarding: Split night Split night sleep study has been ordered for OSA. Thanks! Timothy Lee

## 2020-03-29 NOTE — Addendum Note (Signed)
Addended by: Antonieta Iba on: 03/29/2020 08:37 AM   Modules accepted: Orders

## 2020-04-03 ENCOUNTER — Telehealth: Payer: Self-pay | Admitting: Cardiology

## 2020-04-03 NOTE — Telephone Encounter (Signed)
No PA required, decision ID #[id number].  Ready to schedule

## 2020-04-07 NOTE — Addendum Note (Signed)
Addended by: Patterson Hammersmith A on: 04/07/2020 12:53 PM   Modules accepted: Orders

## 2020-04-10 NOTE — Telephone Encounter (Signed)
Patient is scheduled for lab study on 05/11/20. Patient understands his sleep study will be done at [facility] sleep lab. Patient understands he will receive a sleep packet in a week or so. Patient understands to call if he does not receive the sleep packet in a timely manner.  Left detailed message on voicemail with date and time and informed patient to call back to confirm or reschedule.

## 2020-04-20 ENCOUNTER — Other Ambulatory Visit: Payer: Self-pay | Admitting: Internal Medicine

## 2020-04-26 ENCOUNTER — Other Ambulatory Visit: Payer: Self-pay | Admitting: Family

## 2020-04-26 DIAGNOSIS — J209 Acute bronchitis, unspecified: Secondary | ICD-10-CM

## 2020-05-02 ENCOUNTER — Other Ambulatory Visit: Payer: Self-pay | Admitting: Nurse Practitioner

## 2020-05-02 DIAGNOSIS — E1165 Type 2 diabetes mellitus with hyperglycemia: Secondary | ICD-10-CM

## 2020-05-06 ENCOUNTER — Other Ambulatory Visit: Payer: Self-pay

## 2020-05-06 ENCOUNTER — Encounter (HOSPITAL_COMMUNITY): Payer: Self-pay | Admitting: Emergency Medicine

## 2020-05-06 ENCOUNTER — Ambulatory Visit (HOSPITAL_COMMUNITY)
Admission: EM | Admit: 2020-05-06 | Discharge: 2020-05-06 | Disposition: A | Discharge status: Home / Self Care | Payer: Medicare Other | Attending: Emergency Medicine | Admitting: Emergency Medicine

## 2020-05-06 ENCOUNTER — Ambulatory Visit (INDEPENDENT_AMBULATORY_CARE_PROVIDER_SITE_OTHER): Payer: Medicare Other

## 2020-05-06 DIAGNOSIS — M7989 Other specified soft tissue disorders: Secondary | ICD-10-CM | POA: Diagnosis not present

## 2020-05-06 DIAGNOSIS — M25571 Pain in right ankle and joints of right foot: Secondary | ICD-10-CM

## 2020-05-06 NOTE — ED Provider Notes (Signed)
[facility]    CSN: [id number] Arrival date & time: 05/06/20  1326      History   Chief Complaint Chief Complaint  Patient presents with  . Ankle Pain    HPI Timothy Lee is a 69 y.o. male.   Patient presents with right ankle pain x2 weeks.  The pain is getting worse.  He denies falls or injury.  He denies numbness, weakness, paresthesias, rash, lesions, erythema, ecchymosis, or other symptoms.  He states he has arthritis in multiple other joints.  Attempted at home with Voltaren and Biofreeze.  His medical history includes hypertension, bladder cancer, basal cell carcinoma, current everyday smoker, diabetes, COPD, chronic CHF, CAD, renal cyst.  The history is provided by the patient and medical records.    Past Medical History:  Diagnosis Date  . Arthritis   . Bladder cancer [facility]) urologist-  dr Kipp Brood  . Borderline diabetes   . Bursitis of both shoulders   . CAD (coronary artery disease), native coronary artery    Cath 03/2019 with 20% prox and 60% mid LAD stenosis >>medical management recommended  . Chronic systolic CHF (congestive heart failure), NYHA class 2 (HCC)    EF 29% by cMRI  . Diabetes mellitus without complication ([place])   . Disorder of male genital organs, unspecified   . Esophagus disorder    Constricted Esophagus, per New Patient Packet-PSC   . History of basal cell carcinoma (BCC) excision    05/ 2018  right forehead  . HLD (hyperlipidemia)   . Hypertension   . Mild acid reflux no meds  . Nocturia   . OSA (obstructive sleep apnea)    per pt has not used cpap in years  . Renal cyst, left 11/2011   16 mm  . Sleep apnea    Per New Patient Packet-PSC   . Smokers' cough ([place])   . SOB (shortness of breath) on exertion     Patient Active Problem List   Diagnosis Date Noted  . CAD (coronary artery disease), native coronary artery   . HLD (hyperlipidemia)   . Chronic systolic CHF (congestive heart failure), NYHA class 2 ([place])   .  Chronic systolic heart failure ([place]) 01/28/2020  . COPD (chronic obstructive pulmonary disease) ([place]) 08/25/2019  . SOB (shortness of breath)   . Type 2 diabetes mellitus with hyperglycemia, without long-term current use of insulin ([place]) 09/16/2017  . Essential hypertension 08/19/2017  . OSA (obstructive sleep apnea) 08/19/2017  . Hx of bladder cancer 08/19/2017  . Smoker 08/19/2017    Past Surgical History:  Procedure Laterality Date  . CYSTOSCOPY WITH BIOPSY  03/13/2012   Procedure: CYSTOSCOPY WITH BIOPSY;  Surgeon: Fredricka Bonine, MD;  Location: [facility];  Service: Urology;  Laterality: N/A;     . ESOPHAGEAL DILATION  05/20/1986   Esophagus Stretch, per New Patient Packet-PSC   . RIGHT/LEFT HEART CATH AND CORONARY ANGIOGRAPHY N/A 04/16/2019   Procedure: RIGHT/LEFT HEART CATH AND CORONARY ANGIOGRAPHY;  Surgeon: Troy Sine, MD;  Location: [place] [facility];  Service: Cardiovascular;  Laterality: N/A;  . TONSILLECTOMY  age 22  . TRANSURETHRAL RESECTION OF BLADDER TUMOR N/A 05/23/2017   Procedure: TRANSURETHRAL RESECTION OF BLADDER TUMOR / (TURBT) WITH INSTILLATION OF EPIRUBICIN;  Surgeon: [name], MD;  Location: [facility];  Service: Urology;  Laterality: N/A;  . UVULECTOMY  05/20/1990   Dr.Byers, per new patient packet   . UVULOPALATOPHARYNGOPLASTY  2005   osa  Home Medications    Prior to Admission medications   Medication Sig Start Date End Date Taking? Authorizing Provider  ACCU-CHEK GUIDE test strip USE TO TEST BLOOD SUGAR DAILY. 04/21/20   Lauree Chandler, NP  Accu-Chek Softclix Lancets lancets USE TO TEST BLOOD SUGAR DAILY. 04/21/20   Lauree Chandler, NP  acetaminophen (TYLENOL) 500 MG tablet Take 2 tablets (1,000 mg total) by mouth every 8 (eight) hours as needed for moderate pain. 08/13/19   Darr, Edison Nasuti, PA-C  albuterol (VENTOLIN HFA) 108 (90 Base) MCG/ACT inhaler INHALE 2 PUFFS INTO THE LUNGS EVERY 6  HOURS AS NEEDED FOR WHEEZING OR SHORTNESS OF BREATH. 04/27/20   Lauree Chandler, NP  aspirin 81 MG chewable tablet Chew 1 tablet (81 mg total) by mouth daily. 04/19/19   Mercy Riding, MD  atorvastatin (LIPITOR) 80 MG tablet Take 1 tablet (80 mg total) by mouth daily at 6 PM. 04/18/19   Mercy Riding, MD  furosemide (LASIX) 20 MG tablet Take 1 tablet (20 mg total) by mouth daily. 11/17/19   Sueanne Margarita, MD  glipiZIDE (GLUCOTROL) 5 MG tablet Take 1 tablet (5 mg total) by mouth 2 (two) times daily. 11/02/19 01/31/20  Lauree Chandler, NP  metFORMIN (GLUCOPHAGE) 1000 MG tablet Take one tablet by mouth twice daily with meals (breakfast and supper). Needs an appointment before anymore Future Refills. 05/03/20   Lauree Chandler, NP  metoprolol succinate (TOPROL XL) 100 MG 24 hr tablet Take 1 tablet (100 mg total) by mouth daily. Take with or immediately following a meal. 01/28/20   Turner, Eber Hong, MD  metoprolol succinate (TOPROL XL) 25 MG 24 hr tablet Take 1 tablet (25 mg total) by mouth daily. 03/29/20   Sueanne Margarita, MD  Multiple Vitamin (MULTIVITAMIN WITH MINERALS) TABS tablet Take 1 tablet by mouth daily. 04/19/19   Mercy Riding, MD  nicotine (NICODERM CQ - DOSED IN MG/24 HOURS) 21 mg/24hr patch Place 1 patch (21 mg total) onto the skin daily. 04/19/19   Mercy Riding, MD  potassium chloride SA (KLOR-CON) 20 MEQ tablet Take 2 tablets (40 mEq total) by mouth daily. 10/29/19   Turner, Eber Hong, MD  sacubitril-valsartan (ENTRESTO) 97-103 MG Take 1 tablet by mouth 2 (two) times daily. 01/11/20   Sueanne Margarita, MD  spironolactone (ALDACTONE) 25 MG tablet Take 1 tablet (25 mg total) by mouth daily. 12/09/19   Sueanne Margarita, MD  umeclidinium-vilanterol (ANORO ELLIPTA) 62.5-25 MCG/INH AEPB Inhale 1 puff into the lungs daily. 11/24/19   Lauree Chandler, NP    Family History Family History  Problem Relation Age of Onset  . Diabetes Mother   . Dementia Mother   . Stroke Mother   . Arthritis  Mother   . Hypertension Mother   . Multiple sclerosis Father   . Diabetes Father   . Diabetes Sister   . Hypertension Sister   . ADD / ADHD Son   . Migraines Daughter   . ADD / ADHD Daughter   . Cancer Maternal Grandmother     Social History Social History   Tobacco Use  . Smoking status: Current Every Day Smoker    Packs/day: 0.10    Years: 47.00    Pack years: 4.70    Types: Cigarettes  . Smokeless tobacco: Former Systems developer    Types: Chew, Snuff  . Tobacco comment: cutting back almost quit   Vaping Use  . Vaping Use: Never used  Substance Use  Topics  . Alcohol use: Yes    Comment: very seldom- rare  . Drug use: Yes    Types: Marijuana    Comment: seldom-rare     Allergies   Chantix [varenicline tartrate]   Review of Systems Review of Systems  Constitutional: Negative for chills and fever.  HENT: Negative for ear pain and sore throat.   Eyes: Negative for pain and visual disturbance.  Respiratory: Negative for cough and shortness of breath.   Cardiovascular: Negative for chest pain and palpitations.  Gastrointestinal: Negative for abdominal pain and vomiting.  Genitourinary: Negative for dysuria and hematuria.  Musculoskeletal: Positive for arthralgias. Negative for back pain.  Skin: Negative for color change and rash.  Neurological: Negative for seizures, syncope, weakness and numbness.  All other systems reviewed and are negative.    Physical Exam Triage Vital Signs ED Triage Vitals [05/06/20 1413]  Enc Vitals Group     BP      Pulse      Resp      Temp      Temp src      SpO2      Weight      Height      Head Circumference      Peak Flow      Pain Score 2     Pain Loc      Pain Edu?      Excl. in [place]?    No data found.  Updated Vital Signs BP 132/78 (BP Location: Left Arm)   Pulse 81   Temp 98.2 F (36.8 C)   Resp 17   SpO2 95%   Visual Acuity Right Eye Distance:   Left Eye Distance:   Bilateral Distance:    Right Eye Near:    Left Eye Near:    Bilateral Near:     Physical Exam Vitals and nursing note reviewed.  Constitutional:      General: He is not in acute distress.    Appearance: He is well-developed and well-nourished.  HENT:     Head: Normocephalic and atraumatic.     Mouth/Throat:     Mouth: Mucous membranes are moist.  Eyes:     Conjunctiva/sclera: Conjunctivae normal.  Cardiovascular:     Rate and Rhythm: Normal rate and regular rhythm.     Heart sounds: Normal heart sounds.  Pulmonary:     Effort: Pulmonary effort is normal. No respiratory distress.     Breath sounds: Normal breath sounds.  Abdominal:     Palpations: Abdomen is soft.     Tenderness: There is no abdominal tenderness.  Musculoskeletal:        General: Tenderness present. No swelling, deformity, signs of injury or edema. Normal range of motion.     Cervical back: Neck supple.       Feet:  Skin:    General: Skin is warm and dry.     Capillary Refill: Capillary refill takes less than 2 seconds.     Findings: No bruising, erythema, lesion or rash.  Neurological:     General: No focal deficit present.     Mental Status: He is alert and oriented to person, place, and time.     Gait: Gait normal.  Psychiatric:        Mood and Affect: Mood and affect and mood normal.        Behavior: Behavior normal.      UC Treatments / Results  Labs (all labs ordered are listed, but  only abnormal results are displayed) Labs Reviewed - No data to display  EKG   Radiology DG Ankle Complete Right  Result Date: 05/06/2020 CLINICAL DATA:  Right ankle pain for 2 weeks.  No injury.  Swelling. EXAM: RIGHT ANKLE - COMPLETE 3+ VIEW COMPARISON:  None. FINDINGS: Mild soft tissue swelling. Mild degenerative changes in the ankle. No fractures. No dislocation. IMPRESSION: Mild degenerative changes and soft tissue swelling. No fracture or dislocation. Electronically Signed   By: Dorise Bullion III M.D   On: 05/06/2020 15:58     Procedures Procedures (including critical care time)  Medications Ordered in UC Medications - No data to display  Initial Impression / Assessment and Plan / UC Course  I have reviewed the triage vital signs and the nursing notes.  Pertinent labs & imaging results that were available during my care of the patient were reviewed by me and considered in my medical decision making (see chart for details).   Acute right ankle pain.  X-ray shows degenerative changes.  Instructed patient to continue Tylenol, Biofreeze or Voltaren, ice or heat.  Instructed him to follow-up with his PCP if his symptoms are not improving.  He agrees to plan of care.   Final Clinical Impressions(s) / UC Diagnoses   Final diagnoses:  Acute right ankle pain     Discharge Instructions     Follow up with your primary care provider if your symptoms are not improving.        ED Prescriptions    None     PDMP not reviewed this encounter.   Sharion Balloon, NP 05/06/20 [phone]

## 2020-05-06 NOTE — ED Triage Notes (Signed)
Patient c/o RT ankle pain x 2 weeks.   Patient endorses that pain "is more noticeable at night".   Patient has tried Voltaren and Biofreeze with no relief of symptoms w/ some relief of symptoms.   Patient endorses having a history of ankle problems.

## 2020-05-06 NOTE — Discharge Instructions (Addendum)
Follow up with your primary care provider if your symptoms are not improving.     

## 2020-05-09 ENCOUNTER — Other Ambulatory Visit: Payer: Self-pay | Admitting: Nurse Practitioner

## 2020-05-11 ENCOUNTER — Encounter (HOSPITAL_BASED_OUTPATIENT_CLINIC_OR_DEPARTMENT_OTHER): Payer: Medicare Other | Admitting: Cardiology

## 2020-05-22 ENCOUNTER — Other Ambulatory Visit: Payer: Self-pay

## 2020-05-22 ENCOUNTER — Other Ambulatory Visit: Payer: Medicare Other

## 2020-05-22 DIAGNOSIS — E785 Hyperlipidemia, unspecified: Secondary | ICD-10-CM | POA: Diagnosis not present

## 2020-05-22 LAB — LIPID PANEL
Chol/HDL Ratio: 2.4 ratio (ref 0.0–5.0)
Cholesterol, Total: 92 mg/dL — ABNORMAL LOW (ref 100–199)
HDL: 39 mg/dL — ABNORMAL LOW (ref >39)
LDL Chol Calc (NIH): 33 mg/dL (ref 0–99)
Triglycerides: 106 mg/dL (ref 0–149)
VLDL Cholesterol Cal: 20 mg/dL (ref 5–40)

## 2020-05-22 LAB — ALT: ALT: 23 IU/L (ref 0–44)

## 2020-05-29 ENCOUNTER — Other Ambulatory Visit (HOSPITAL_COMMUNITY): Payer: Medicare Other

## 2020-05-29 ENCOUNTER — Encounter (HOSPITAL_COMMUNITY): Payer: Self-pay | Admitting: Cardiology

## 2020-06-02 ENCOUNTER — Other Ambulatory Visit: Payer: Self-pay | Admitting: Nurse Practitioner

## 2020-06-02 NOTE — Telephone Encounter (Signed)
rx sent to pharmacy by e-script  

## 2020-06-09 ENCOUNTER — Other Ambulatory Visit: Payer: Self-pay | Admitting: Cardiology

## 2020-06-12 ENCOUNTER — Telehealth (HOSPITAL_COMMUNITY): Payer: Self-pay | Admitting: Cardiology

## 2020-06-12 NOTE — Telephone Encounter (Signed)
Just an FYI. We have made several attempts to contact this patient including sending a letter to schedule or reschedule their echocardiogram. We will be removing the patient from the echo [place].   05/29/20 NO SHOWED- MAILED LETTER/LBW  03/06/20 Patient called this am due to NO SHOW and he couldnot come due to going to [place] to take care of Mothers estate. He did not wish to reschedule/LBW      Thank you

## 2020-06-13 ENCOUNTER — Other Ambulatory Visit: Payer: Self-pay | Admitting: Nurse Practitioner

## 2020-06-16 ENCOUNTER — Other Ambulatory Visit: Payer: Self-pay

## 2020-06-16 MED ORDER — ANORO ELLIPTA 62.5-25 MCG/INH IN AEPB: INHALATION_SPRAY | RESPIRATORY_TRACT | 0 refills | Status: DC

## 2020-06-16 NOTE — Telephone Encounter (Signed)
Refill previously denied because he needed a followup.  He is scheduled to see Janett Billow at 06/26/20. Refill pended with only #14 given.

## 2020-06-26 ENCOUNTER — Ambulatory Visit: Payer: Medicare Other | Admitting: Nurse Practitioner

## 2020-06-28 ENCOUNTER — Encounter: Payer: Self-pay | Admitting: Nurse Practitioner

## 2020-06-28 ENCOUNTER — Other Ambulatory Visit: Payer: Self-pay

## 2020-06-28 ENCOUNTER — Ambulatory Visit (INDEPENDENT_AMBULATORY_CARE_PROVIDER_SITE_OTHER): Payer: Medicare Other | Admitting: Nurse Practitioner

## 2020-06-28 VITALS — BP 132/78 | HR 77 | Temp 97.1°F | Ht 72.0 in | Wt 267.0 lb

## 2020-06-28 DIAGNOSIS — I10 Essential (primary) hypertension: Secondary | ICD-10-CM

## 2020-06-28 DIAGNOSIS — E782 Mixed hyperlipidemia: Secondary | ICD-10-CM

## 2020-06-28 DIAGNOSIS — F17200 Nicotine dependence, unspecified, uncomplicated: Secondary | ICD-10-CM

## 2020-06-28 DIAGNOSIS — E1165 Type 2 diabetes mellitus with hyperglycemia: Secondary | ICD-10-CM

## 2020-06-28 DIAGNOSIS — I5022 Chronic systolic (congestive) heart failure: Secondary | ICD-10-CM

## 2020-06-28 DIAGNOSIS — Z8551 Personal history of malignant neoplasm of bladder: Secondary | ICD-10-CM

## 2020-06-28 DIAGNOSIS — M549 Dorsalgia, unspecified: Secondary | ICD-10-CM

## 2020-06-28 DIAGNOSIS — J449 Chronic obstructive pulmonary disease, unspecified: Secondary | ICD-10-CM | POA: Diagnosis not present

## 2020-06-28 NOTE — Patient Instructions (Addendum)
Due for TDAP vaccine- to get at your local pharmacy.   DASH Eating Plan DASH stands for Dietary Approaches to Stop Hypertension. The DASH eating plan is a healthy eating plan that has been shown to:  Reduce high blood pressure (hypertension).  Reduce your risk for type 2 diabetes, heart disease, and stroke.  Help with weight loss. What are tips for following this plan? Reading food labels  Check food labels for the amount of salt (sodium) per serving. Choose foods with less than 5 percent of the Daily Value of sodium. Generally, foods with less than 300 milligrams (mg) of sodium per serving fit into this eating plan.  To find whole grains, look for the word "whole" as the first word in the ingredient list. Shopping  Buy products labeled as "low-sodium" or "no salt added."  Buy fresh foods. Avoid canned foods and pre-made or frozen meals. Cooking  Avoid adding salt when cooking. Use salt-free seasonings or herbs instead of table salt or sea salt. Check with your health care provider or pharmacist before using salt substitutes.  Do not fry foods. Cook foods using healthy methods such as baking, boiling, grilling, roasting, and broiling instead.  Cook with heart-healthy oils, such as olive, canola, avocado, soybean, or sunflower oil. Meal planning  Eat a balanced diet that includes: ? 4 or more servings of fruits and 4 or more servings of vegetables each day. Try to fill one-half of your plate with fruits and vegetables. ? 6-8 servings of whole grains each day. ? Less than 6 oz (170 g) of lean meat, poultry, or fish each day. A 3-oz (85-g) serving of meat is about the same size as a deck of cards. One egg equals 1 oz (28 g). ? 2-3 servings of low-fat dairy each day. One serving is 1 cup (237 mL). ? 1 serving of nuts, seeds, or beans 5 times each week. ? 2-3 servings of heart-healthy fats. Healthy fats called omega-3 fatty acids are found in foods such as walnuts, flaxseeds, fortified  milks, and eggs. These fats are also found in cold-water fish, such as sardines, salmon, and mackerel.  Limit how much you eat of: ? Canned or prepackaged foods. ? Food that is high in trans fat, such as some fried foods. ? Food that is high in saturated fat, such as fatty meat. ? Desserts and other sweets, sugary drinks, and other foods with added sugar. ? Full-fat dairy products.  Do not salt foods before eating.  Do not eat more than 4 egg yolks a week.  Try to eat at least 2 vegetarian meals a week.  Eat more home-cooked food and less restaurant, buffet, and fast food.   Lifestyle  When eating at a restaurant, ask that your food be prepared with less salt or no salt, if possible.  If you drink alcohol: ? Limit how much you use to:  0-1 drink a day for women who are not pregnant.  0-2 drinks a day for men. ? Be aware of how much alcohol is in your drink. In the [place] one drink equals one 12 oz bottle of beer (355 mL), one 5 oz glass of wine (148 mL), or one 1 oz glass of hard liquor (44 mL). General information  Avoid eating more than 2,300 mg of salt a day. If you have hypertension, you may need to reduce your sodium intake to 1,500 mg a day.  Work with your health care provider to maintain a healthy body weight or  to lose weight. Ask what an ideal weight is for you.  Get at least 30 minutes of exercise that causes your heart to beat faster (aerobic exercise) most days of the week. Activities may include walking, swimming, or biking.  Work with your health care provider or dietitian to adjust your eating plan to your individual calorie needs. What foods should I eat? Fruits All fresh, dried, or frozen fruit. Canned fruit in natural juice (without added sugar). Vegetables Fresh or frozen vegetables (raw, steamed, roasted, or grilled). Low-sodium or reduced-sodium tomato and vegetable juice. Low-sodium or reduced-sodium tomato sauce and tomato paste. Low-sodium or  reduced-sodium canned vegetables. Grains Whole-grain or whole-wheat bread. Whole-grain or whole-wheat pasta. Brown rice. [place]. Bulgur. Whole-grain and low-sodium cereals. Pita bread. Low-fat, low-sodium crackers. Whole-wheat flour tortillas. Meats and other proteins Skinless chicken or [place]. Ground chicken or [place]. Pork with fat trimmed off. Fish and seafood. Egg whites. Dried beans, peas, or lentils. Unsalted nuts, nut butters, and seeds. Unsalted canned beans. Lean cuts of beef with fat trimmed off. Low-sodium, lean precooked or cured meat, such as sausages or meat loaves. Dairy Low-fat (1%) or fat-free (skim) milk. Reduced-fat, low-fat, or fat-free cheeses. Nonfat, low-sodium ricotta or cottage cheese. Low-fat or nonfat yogurt. Low-fat, low-sodium cheese. Fats and oils Soft margarine without trans fats. Vegetable oil. Reduced-fat, low-fat, or light mayonnaise and salad dressings (reduced-sodium). Canola, safflower, olive, avocado, soybean, and sunflower oils. Avocado. Seasonings and condiments Herbs. Spices. Seasoning mixes without salt. Other foods Unsalted popcorn and pretzels. Fat-free sweets. The items listed above may not be a complete list of foods and beverages you can eat. Contact a dietitian for more information. What foods should I avoid? Fruits Canned fruit in a light or heavy syrup. Fried fruit. Fruit in cream or butter sauce. Vegetables Creamed or fried vegetables. Vegetables in a cheese sauce. Regular canned vegetables (not low-sodium or reduced-sodium). Regular canned tomato sauce and paste (not low-sodium or reduced-sodium). Regular tomato and vegetable juice (not low-sodium or reduced-sodium). Angie Fava. Olives. Grains Baked goods made with fat, such as croissants, muffins, or some breads. Dry pasta or rice meal packs. Meats and other proteins Fatty cuts of meat. Ribs. Fried meat. Berniece Salines. Bologna, salami, and other precooked or cured meats, such as sausages or  meat loaves. Fat from the back of a pig (fatback). Bratwurst. Salted nuts and seeds. Canned beans with added salt. Canned or smoked fish. Whole eggs or egg yolks. Chicken or [place] with skin. Dairy Whole or 2% milk, cream, and half-and-half. Whole or full-fat cream cheese. Whole-fat or sweetened yogurt. Full-fat cheese. Nondairy creamers. Whipped toppings. Processed cheese and cheese spreads. Fats and oils Butter. Stick margarine. Lard. Shortening. Ghee. Bacon fat. Tropical oils, such as coconut, palm kernel, or palm oil. Seasonings and condiments Onion salt, garlic salt, seasoned salt, table salt, and sea salt. Worcestershire sauce. Tartar sauce. Barbecue sauce. Teriyaki sauce. Soy sauce, including reduced-sodium. Steak sauce. Canned and packaged gravies. Fish sauce. Oyster sauce. Cocktail sauce. Store-bought horseradish. Ketchup. Mustard. Meat flavorings and tenderizers. Bouillon cubes. Hot sauces. Pre-made or packaged marinades. Pre-made or packaged taco seasonings. Relishes. Regular salad dressings. Other foods Salted popcorn and pretzels. The items listed above may not be a complete list of foods and beverages you should avoid. Contact a dietitian for more information. Where to find more information  National Heart, Lung, and Blood Institute: https://wilson-eaton.com/  American Heart Association: www.heart.org  Academy of Nutrition and Dietetics: www.eatright.[place]: www.kidney.org Summary  The DASH eating plan  is a healthy eating plan that has been shown to reduce high blood pressure (hypertension). It may also reduce your risk for type 2 diabetes, heart disease, and stroke.  When on the DASH eating plan, aim to eat more fresh fruits and vegetables, whole grains, lean proteins, low-fat dairy, and heart-healthy fats.  With the DASH eating plan, you should limit salt (sodium) intake to 2,300 mg a day. If you have hypertension, you may need to reduce your sodium intake to  1,500 mg a day.  Work with your health care provider or dietitian to adjust your eating plan to your individual calorie needs. This information is not intended to replace advice given to you by your health care provider. Make sure you discuss any questions you have with your health care provider. Document Revised: 04/09/2019 Document Reviewed: 04/09/2019 Elsevier Patient Education  2021 [employer].

## 2020-06-28 NOTE — Progress Notes (Signed)
Careteam: Patient Care Team: Lauree Chandler, NP as PCP - General (Geriatric Medicine) Sueanne Margarita, MD as PCP - Cardiology (Cardiology) Melissa Montane, MD as Consulting Physician (Otolaryngology) [name], MD as Consulting Physician (Urology)  PLACE OF SERVICE:  [place] Directive information Does Patient Have a Medical Advance Directive?: No, Would patient like information on creating a medical advance directive?: Yes (MAU/Ambulatory/Procedural Areas - Information given) (patient has paperwork at home)  Allergies  Allergen Reactions  . Chantix [Varenicline Tartrate]     Aggression     Chief Complaint  Patient presents with  . Medical Management of Chronic Issues    10 month follow-up and discuss medications. Discuss need for eye exam, A1c, foot exam, Td/tdap and flu vaccine. Per patient he will never get the flu vaccine or covid boosters.      HPI: Patient is a 70 y.o. male for follow up.  Reports he has been doing good.  Recently went on a trip to [place]. Went gambling but planned to go fishing with friends.   CAD/CHF-  Missed follow up with cardiology due to calling house to set up appt- only wants his cell phone number called.   Continues to smoke cigarettes, mom passed away, daughter got married. 5-6 cigarettes a day.   Went through a spell that he was eating a few hot dogs a day recently stopped this (2 days ago).   DM-Blood sugars have been elevated but he was drinking rum and cokes on vacation- over the last few days have come back down. No low blood sugars. Has not been to the eye doctor. "has been negligent" waiting on his wife to make him an appt.   Hx of bladder cancer- follow by urologist yearly.  No blood in urine.   Had some ankle swelling- had an xray and was diagnosised with arthritis.   Uses muscle relaxer if he has been on his feet all day and has cramps in his legs or feet.  2 budging disc in his back. Needs muscle  relaxer due to this. Does not use every day but helpful when his muscle tighten up.    Review of Systems:  Review of Systems  Constitutional: Negative for chills, fever and weight loss.  HENT: Negative for tinnitus.   Respiratory: Negative for cough, sputum production and shortness of breath.   Cardiovascular: Negative for chest pain, palpitations and leg swelling.  Gastrointestinal: Negative for abdominal pain, constipation, diarrhea and heartburn.  Genitourinary: Negative for dysuria, frequency and urgency.  Musculoskeletal: Negative for back pain, falls, joint pain and myalgias.  Skin: Negative.   Neurological: Negative for dizziness and headaches.  Psychiatric/Behavioral: Negative for depression and memory loss. The patient does not have insomnia.     Past Medical History:  Diagnosis Date  . Arthritis   . Bladder cancer [facility]) urologist-  dr Kipp Brood  . Borderline diabetes   . Bursitis of both shoulders   . CAD (coronary artery disease), native coronary artery    Cath 03/2019 with 20% prox and 60% mid LAD stenosis >>medical management recommended  . Chronic systolic CHF (congestive heart failure), NYHA class 2 (HCC)    EF 29% by cMRI  . Diabetes mellitus without complication ([place])   . Disorder of male genital organs, unspecified   . Esophagus disorder    Constricted Esophagus, per New Patient Packet-PSC   . History of basal cell carcinoma (BCC) excision    05/ 2018  right forehead  .  HLD (hyperlipidemia)   . Hypertension   . Mild acid reflux no meds  . Nocturia   . OSA (obstructive sleep apnea)    per pt has not used cpap in years  . Renal cyst, left 11/2011   16 mm  . Sleep apnea    Per New Patient Packet-PSC   . Smokers' cough ([place])   . SOB (shortness of breath) on exertion    Past Surgical History:  Procedure Laterality Date  . CYSTOSCOPY WITH BIOPSY  03/13/2012   Procedure: CYSTOSCOPY WITH BIOPSY;  Surgeon: Fredricka Bonine, MD;  Location: [facility];  Service: Urology;  Laterality: N/A;     . ESOPHAGEAL DILATION  05/20/1986   Esophagus Stretch, per New Patient Packet-PSC   . RIGHT/LEFT HEART CATH AND CORONARY ANGIOGRAPHY N/A 04/16/2019   Procedure: RIGHT/LEFT HEART CATH AND CORONARY ANGIOGRAPHY;  Surgeon: Troy Sine, MD;  Location: [facility];  Service: Cardiovascular;  Laterality: N/A;  . TONSILLECTOMY  age 78  . TRANSURETHRAL RESECTION OF BLADDER TUMOR N/A 05/23/2017   Procedure: TRANSURETHRAL RESECTION OF BLADDER TUMOR / (TURBT) WITH INSTILLATION OF EPIRUBICIN;  Surgeon: [name], MD;  Location: [facility];  Service: Urology;  Laterality: N/A;  . UVULECTOMY  05/20/1990   Dr.Byers, per new patient packet   . UVULOPALATOPHARYNGOPLASTY  2005   osa   Social History:   reports that he has been smoking cigarettes. He has a 4.70 pack-year smoking history. He has quit using smokeless tobacco.  His smokeless tobacco use included chew and snuff. He reports current alcohol use. He reports current drug use. Drug: Marijuana.  Family History  Problem Relation Age of Onset  . Diabetes Mother   . Dementia Mother   . Stroke Mother   . Arthritis Mother   . Hypertension Mother   . Multiple sclerosis Father   . Diabetes Father   . Diabetes Sister   . Hypertension Sister   . ADD / ADHD Son   . Migraines Daughter   . ADD / ADHD Daughter   . Cancer Maternal Grandmother     Medications: Patient's Medications  New Prescriptions   No medications on file  Previous Medications   ACCU-CHEK GUIDE TEST STRIP    USE TO TEST BLOOD SUGAR DAILY.   ACCU-CHEK SOFTCLIX LANCETS LANCETS    USE TO TEST BLOOD SUGAR DAILY.   ACETAMINOPHEN (TYLENOL) 500 MG TABLET    Take 2 tablets (1,000 mg total) by mouth every 8 (eight) hours as needed for moderate pain.   ALBUTEROL (VENTOLIN HFA) 108 (90 BASE) MCG/ACT INHALER    INHALE 2 PUFFS INTO THE LUNGS EVERY 6 HOURS AS NEEDED FOR WHEEZING OR SHORTNESS OF BREATH.    ASPIRIN 81 MG CHEWABLE TABLET    Chew 1 tablet (81 mg total) by mouth daily.   ATORVASTATIN (LIPITOR) 80 MG TABLET    TAKE ONE TABLET BY MOUTH ONCE DAILY AT 6PM.   FUROSEMIDE (LASIX) 20 MG TABLET    Take 1 tablet (20 mg total) by mouth daily.   GLIPIZIDE (GLUCOTROL) 5 MG TABLET    TAKE (1) TABLET BY MOUTH TWICE DAILY.   METFORMIN (GLUCOPHAGE) 1000 MG TABLET    Take one tablet by mouth twice daily with meals (breakfast and supper). Needs an appointment before anymore Future Refills.   METOPROLOL SUCCINATE (TOPROL XL) 100 MG 24 HR TABLET    Take 1 tablet (100 mg total) by mouth daily. Take with or immediately following a  meal.   METOPROLOL SUCCINATE (TOPROL XL) 25 MG 24 HR TABLET    Take 1 tablet (25 mg total) by mouth daily.   MULTIPLE VITAMIN (MULTIVITAMIN WITH MINERALS) TABS TABLET    Take 1 tablet by mouth daily.   NICOTINE (NICODERM CQ - DOSED IN MG/24 HOURS) 21 MG/24HR PATCH    Place 1 patch (21 mg total) onto the skin daily.   POTASSIUM CHLORIDE SA (KLOR-CON) 20 MEQ TABLET    Take 2 tablets (40 mEq total) by mouth daily.   SACUBITRIL-VALSARTAN (ENTRESTO) 97-103 MG    Take 1 tablet by mouth 2 (two) times daily.   SPIRONOLACTONE (ALDACTONE) 25 MG TABLET    Take 1 tablet (25 mg total) by mouth daily.   UMECLIDINIUM-VILANTEROL (ANORO ELLIPTA) 62.5-25 MCG/INH AEPB    INHALE 1 PUFF INTO THE LUNGS ONCE DAILY.  Modified Medications   No medications on file  Discontinued Medications   No medications on file    Physical Exam:  Vitals:   06/28/20 1020  BP: 132/78  Pulse: 77  Temp: (!) 97.1 F (36.2 C)  TempSrc: Temporal  SpO2: 96%  Weight: 267 lb (121.1 kg)  Height: 6' (1.829 m)   Body mass index is 36.21 kg/m. Wt Readings from Last 3 Encounters:  06/28/20 267 lb (121.1 kg)  03/29/20 260 lb 6.4 oz (118.1 kg)  01/11/20 259 lb (117.5 kg)    Physical Exam Constitutional:      General: He is not in acute distress.    Appearance: He is well-developed and well-nourished. He is not  diaphoretic.  HENT:     Head: Normocephalic and atraumatic.     Right Ear: Tympanic membrane and ear canal normal.     Left Ear: Tympanic membrane, ear canal and external ear normal.     Nose: No congestion or rhinorrhea.     Mouth/Throat:     Mouth: Oropharynx is clear and moist.     Pharynx: No oropharyngeal exudate.  Eyes:     Extraocular Movements: EOM normal.     Conjunctiva/sclera: Conjunctivae normal.     Pupils: Pupils are equal, round, and reactive to light.  Cardiovascular:     Rate and Rhythm: Normal rate and regular rhythm.     Heart sounds: Normal heart sounds.  Pulmonary:     Effort: Pulmonary effort is normal.     Breath sounds: Normal breath sounds.  Abdominal:     General: Bowel sounds are normal.     Palpations: Abdomen is soft.  Musculoskeletal:        General: No tenderness or edema.     Cervical back: Normal range of motion and neck supple.  Skin:    General: Skin is warm and dry.  Neurological:     Mental Status: He is alert and oriented to person, place, and time.  Psychiatric:        Mood and Affect: Mood and affect and mood normal.        Behavior: Behavior normal.     Labs reviewed: Basic Metabolic Panel: Recent Labs    08/11/19 0810 10/14/19 1612 01/28/20 0958  NA 141 142 138  K 4.4 4.6 4.6  CL 105 104 99  CO2 _0 GLUCOSE 142* 147* 151*  BUN _1 CREATININE 0.95 1.17 1.16  CALCIUM 9.6 10.5* 10.4*   Liver Function Tests: Recent Labs    05/22/20 0740  ALT 23   No results for input(s): LIPASE, AMYLASE in the last  8760 hours. No results for input(s): AMMONIA in the last 8760 hours. CBC: No results for input(s): WBC, NEUTROABS, HGB, HCT, MCV, PLT in the last 8760 hours. Lipid Panel: Recent Labs    05/22/20 0740  CHOL 92*  HDL 39*  LDLCALC 33  TRIG 106  CHOLHDL 2.4   TSH: No results for input(s): TSH in the last 8760 hours. A1C: Lab Results  Component Value Date   HGBA1C 6.6 (H) 08/11/2019      Assessment/Plan 1. Type 2 diabetes mellitus with hyperglycemia, without long-term current use of insulin (HCC) -continues on metformin and glipizide. Encouraged dietary compliance, routine foot care/monitoring and to keep up with diabetic eye exams through ophthalmology  - Hemoglobin A1c  2. Essential hypertension -stable on current regimen, dietary modifications recommended for htn and CHF.  - CBC with Differential/Platelet - CMP with eGFR([facility])  3. Morbid obesity (HCC) BMI 36 with hx of hyperlipidemia, chf, htn, COPD, DM. Discussed importance of weight loss through prior eating and increase in physical activity as he can tolerate.   4. Mixed hyperlipidemia LDL at goal, continues on lipitor 80 mg dialy  5. Hx of bladder cancer Continues to follow up with urology, no blood or abnormal urine.  6. Chronic systolic CHF (congestive heart failure) (HCC) Stable, euvolemic, discussed importance of dietary compliance. Continues on lasix 20 mg daily, metoprolol and entresto.   7. Chronic obstructive pulmonary disease, unspecified COPD type ([place]) Stable, continues on anoro daily, encouraged smoking cessation.   8. Smoker -encouraged cessation  9. Back pain, unspecified back location, unspecified back pain laterality, unspecified chronicity -ongoing, had previously been on muscle relaxer, recommend using heating pad and muscle rub at this time.   Next appt: 4 months.  Timothy Lee. [place], [facility] [phone]

## 2020-06-29 LAB — CBC WITH DIFFERENTIAL/PLATELET
Absolute Monocytes: 695 cells/uL (ref 200–950)
Basophils Absolute: 44 cells/uL (ref 0–200)
Basophils Relative: 0.5 %
Eosinophils Absolute: 537 cells/uL — ABNORMAL HIGH (ref 15–500)
Eosinophils Relative: 6.1 %
HCT: 44.2 % (ref 38.5–50.0)
Hemoglobin: 14.9 g/dL (ref 13.2–17.1)
Lymphs Abs: 1874 cells/uL (ref 850–3900)
MCH: 31 pg (ref 27.0–33.0)
MCHC: 33.7 g/dL (ref 32.0–36.0)
MCV: 91.9 fL (ref 80.0–100.0)
MPV: 10.4 fL (ref 7.5–12.5)
Monocytes Relative: 7.9 %
Neutro Abs: 5650 cells/uL (ref 1500–7800)
Neutrophils Relative %: 64.2 %
Platelets: 273 10*3/uL (ref 140–400)
RBC: 4.81 10*6/uL (ref 4.20–5.80)
RDW: 12.4 % (ref 11.0–15.0)
Total Lymphocyte: 21.3 %
WBC: 8.8 10*3/uL (ref 3.8–10.8)

## 2020-06-29 LAB — COMPLETE METABOLIC PANEL WITH GFR
AG Ratio: 2.4 (calc) (ref 1.0–2.5)
ALT: 29 U/L (ref 9–46)
AST: 16 U/L (ref 10–35)
Albumin: 4.3 g/dL (ref 3.6–5.1)
Alkaline phosphatase (APISO): 48 U/L (ref 35–144)
BUN: 16 mg/dL (ref 7–25)
CO2: 28 mmol/L (ref 20–32)
Calcium: 10 mg/dL (ref 8.6–10.3)
Chloride: 104 mmol/L (ref 98–110)
Creat: 0.82 mg/dL (ref 0.70–1.25)
GFR, Est African American: 105 mL/min/{1.73_m2} (ref >=60)
GFR, Est Non African American: 90 mL/min/{1.73_m2} (ref >=60)
Globulin: 1.8 g/dL (calc) — ABNORMAL LOW (ref 1.9–3.7)
Glucose, Bld: 162 mg/dL — ABNORMAL HIGH (ref 65–139)
Potassium: 4.6 mmol/L (ref 3.5–5.3)
Sodium: 140 mmol/L (ref 135–146)
Total Bilirubin: 0.9 mg/dL (ref 0.2–1.2)
Total Protein: 6.1 g/dL (ref 6.1–8.1)

## 2020-06-29 LAB — HEMOGLOBIN A1C
Hgb A1c MFr Bld: 7.5 % of total Hgb — ABNORMAL HIGH (ref <5.7)
Mean Plasma Glucose: 169 mg/dL
eAG (mmol/L): 9.3 mmol/L

## 2020-06-30 ENCOUNTER — Other Ambulatory Visit: Payer: Self-pay

## 2020-06-30 DIAGNOSIS — E1165 Type 2 diabetes mellitus with hyperglycemia: Secondary | ICD-10-CM

## 2020-06-30 MED ORDER — DAPAGLIFLOZIN PROPANEDIOL 10 MG PO TABS: 10.0000 mg | ORAL_TABLET | Freq: Every day | ORAL | 0 refills | Status: DC

## 2020-07-24 ENCOUNTER — Other Ambulatory Visit: Payer: Self-pay | Admitting: Nurse Practitioner

## 2020-09-18 ENCOUNTER — Other Ambulatory Visit: Payer: Self-pay | Admitting: Nurse Practitioner

## 2020-09-18 DIAGNOSIS — E1165 Type 2 diabetes mellitus with hyperglycemia: Secondary | ICD-10-CM

## 2020-09-23 ENCOUNTER — Other Ambulatory Visit: Payer: Self-pay | Admitting: Nurse Practitioner

## 2020-09-27 ENCOUNTER — Ambulatory Visit: Payer: Self-pay | Admitting: Nurse Practitioner

## 2020-09-27 DIAGNOSIS — H5211 Myopia, right eye: Secondary | ICD-10-CM | POA: Diagnosis not present

## 2020-09-27 DIAGNOSIS — E119 Type 2 diabetes mellitus without complications: Secondary | ICD-10-CM | POA: Diagnosis not present

## 2020-09-27 DIAGNOSIS — H40013 Open angle with borderline findings, low risk, bilateral: Secondary | ICD-10-CM | POA: Diagnosis not present

## 2020-09-27 DIAGNOSIS — H2513 Age-related nuclear cataract, bilateral: Secondary | ICD-10-CM | POA: Diagnosis not present

## 2020-09-27 LAB — HM DIABETES EYE EXAM

## 2020-09-29 ENCOUNTER — Other Ambulatory Visit: Payer: Self-pay

## 2020-09-29 ENCOUNTER — Encounter: Payer: Self-pay | Admitting: Nurse Practitioner

## 2020-09-29 ENCOUNTER — Ambulatory Visit (INDEPENDENT_AMBULATORY_CARE_PROVIDER_SITE_OTHER): Payer: Medicare Other | Admitting: Nurse Practitioner

## 2020-09-29 VITALS — BP 130/82 | HR 86 | Temp 97.7°F | Ht 72.0 in | Wt 266.4 lb

## 2020-09-29 DIAGNOSIS — E782 Mixed hyperlipidemia: Secondary | ICD-10-CM | POA: Diagnosis not present

## 2020-09-29 DIAGNOSIS — I10 Essential (primary) hypertension: Secondary | ICD-10-CM | POA: Diagnosis not present

## 2020-09-29 DIAGNOSIS — I5022 Chronic systolic (congestive) heart failure: Secondary | ICD-10-CM

## 2020-09-29 DIAGNOSIS — I25119 Atherosclerotic heart disease of native coronary artery with unspecified angina pectoris: Secondary | ICD-10-CM

## 2020-09-29 DIAGNOSIS — Z8551 Personal history of malignant neoplasm of bladder: Secondary | ICD-10-CM

## 2020-09-29 DIAGNOSIS — E1165 Type 2 diabetes mellitus with hyperglycemia: Secondary | ICD-10-CM

## 2020-09-29 DIAGNOSIS — M549 Dorsalgia, unspecified: Secondary | ICD-10-CM

## 2020-09-29 NOTE — Progress Notes (Signed)
Careteam: Patient Care Team: Timothy Chandler, NP as PCP - General (Geriatric Medicine) Timothy Margarita, MD as PCP - Cardiology (Cardiology) Timothy Montane, MD as Consulting Physician (Otolaryngology) Timothy Aloe, MD as Consulting Physician (Urology)  PLACE OF SERVICE:  [facility] Directive information Does Patient Have a Medical Advance Directive?: No, Would patient like information on creating a medical advance directive?: No - Patient declined  Allergies  Allergen Reactions  . Chantix [Varenicline Tartrate]     Aggression     Chief Complaint  Patient presents with  . Medical Management of Chronic Issues    3 month follow up. Patient would like to discuss referral to pain clinic. Patient has pain in back, hands and shoulder. Knee sometimes flares up.   . Quality Metric Gaps    Eye exam, foot exam    Timothy [facility]: Patient is a 70 y.o. male for routine follow up  DM- a1c not at goal on last labs, farxiga added. He also is supposed to be talking metformin twice daily and glipizide twice daily. He does not know what medication he is taking. He states he has "not been good" eating a lot of pasta.   Chronic pain of back and knees- lifted a 200 lbs grill this week. [facility] request pain management but 'would rather be left alone"  Does not want to take more than 500 mg of tylenol at one time. Occasionally has muscle spasms but overall pain is manageable.   CHF- [facility] Lee wanted to repeat echo in January and he missed appt. No chest pains or shortness of breath.   htn- blood pressure last night 128/88 Has taken medication today.   seasonall allegerie- xyzal does not work -likes phenylephrine nasal decongestantl spray- reports he has never had rebound congestion- can not find dose he wants.   Continues to smoke- 1/2 ppd    Review of Systems:  Review of Systems  Constitutional: Negative for chills, fever and weight loss.  HENT: Negative for tinnitus.   Respiratory:  Negative for cough, sputum production and shortness of breath.   Cardiovascular: Negative for chest pain, palpitations and leg swelling.  Gastrointestinal: Negative for abdominal pain, constipation, diarrhea and heartburn.  Genitourinary: Negative for dysuria, frequency and urgency.  Musculoskeletal: Positive for back pain and myalgias. Negative for falls and joint pain.  Skin: Negative.   Neurological: Negative for dizziness and headaches.  Psychiatric/Behavioral: Negative for depression and memory loss. The patient does not have insomnia.     Past Medical History:  Diagnosis Date  . Arthritis   . Bladder cancer [facility]) urologist-  [facility]  . Borderline diabetes   . Bursitis of both shoulders   . CAD (coronary artery disease), native coronary artery    Cath 03/2019 with 20% prox and 60% mid LAD stenosis >>medical management recommended  . Chronic systolic CHF (congestive heart failure), NYHA class 2 (Tim[facility])    EF 29% by cMRI  . Diabetes mellitus without complication ([facility])   . Disorder of male genital organs, unspecified   . Esophagus disorder    Constricted Esophagus, per New Patient Packet-Tim[facility]   . History of basal cell carcinoma (BCC) excision    05/ 2018  right forehead  . HLD (hyperlipidemia)   . Hypertension   . Mild acid reflux no meds  . Nocturia   . OSA (obstructive sleep apnea)    per pt has not used cpap in years  . Renal cyst, left 11/2011   16 mm  .  Sleep apnea    Per New Patient Packet-Timothy Lee   . Smokers' cough (Timothy Lee)   . SOB (shortness of breath) on exertion    Past Surgical History:  Procedure Laterality Date  . CYSTOSCOPY WITH BIOPSY  03/13/2012   Procedure: CYSTOSCOPY WITH BIOPSY;  Surgeon: Timothy Bonine, MD;  Location: [facility];  Service: Urology;  Laterality: N/A;     . ESOPHAGEAL DILATION  05/20/1986   Esophagus Stretch, per New Patient Packet-Tim[facility] Lee   . RIGHT/LEFT HEART CATH AND CORONARY ANGIOGRAPHY N/A 04/16/2019   Procedure:  RIGHT/LEFT HEART CATH AND CORONARY ANGIOGRAPHY;  Surgeon: Timothy Sine, MD;  Location: [facility];  Service: Cardiovascular;  Laterality: N/A;  . TONSILLECTOMY  age 81  . TRANSURETHRAL RESECTION OF BLADDER TUMOR N/A 05/23/2017   Procedure: TRANSURETHRAL RESECTION OF BLADDER TUMOR / (TURBT) WITH INSTILLATION OF EPIRUBICIN;  Surgeon: Timothy Aloe, MD;  Location: [facility];  Service: Urology;  Laterality: N/A;  . UVULECTOMY  05/20/1990   [facility], per new patient packet   . UVULOPALATOPHARYNGOPLASTY  2005   osa   Social History:   reports that he has been smoking cigarettes. He has a 4.70 pack-year smoking history. He has quit using smokeless tobacco.  His smokeless tobacco use included chew and snuff. He reports current alcohol use. He reports current drug use. Drug: Marijuana.  Family History  Problem Relation Age of Onset  . Diabetes Mother   . Dementia Mother   . Stroke Mother   . Arthritis Mother   . Hypertension Mother   . Multiple sclerosis Father   . Diabetes Father   . Diabetes Sister   . Hypertension Sister   . ADD / ADHD Son   . Migraines Daughter   . ADD / ADHD Daughter   . Cancer Maternal Grandmother     Medications: Patient's Medications  New Prescriptions   No medications on file  Previous Medications   ACCU-CHEK GUIDE TEST STRIP    USE TO TEST BLOOD SUGAR DAILY.   ACCU-CHEK SOFTCLIX LANCETS LANCETS    USE TO TEST BLOOD SUGAR DAILY.   ACETAMINOPHEN (TYLENOL) 500 MG TABLET    Take 2 tablets (1,000 mg total) by mouth every 8 (eight) hours as needed for moderate pain.   ALBUTEROL (VENTOLIN HFA) 108 (90 BASE) MCG/ACT INHALER    INHALE 2 PUFFS INTO THE LUNGS EVERY 6 HOURS AS NEEDED FOR WHEEZING OR SHORTNESS OF BREATH.   ANORO ELLIPTA 62.5-25 MCG/INH AEPB    INHALE 1 PUFF INTO THE LUNGS ONCE DAILY.   ASPIRIN 81 MG CHEWABLE TABLET    Chew 1 tablet (81 mg total) by mouth daily.   ATORVASTATIN (LIPITOR) 80 MG TABLET    TAKE ONE TABLET BY MOUTH  ONCE DAILY AT 6PM.   DAPAGLIFLOZIN PROPANEDIOL (FARXIGA) 10 MG TABS TABLET    Take 1 tablet (10 mg total) by mouth daily before breakfast.   FUROSEMIDE (LASIX) 20 MG TABLET    Take 1 tablet (20 mg total) by mouth daily.   GLIPIZIDE (GLUCOTROL) 5 MG TABLET    TAKE (1) TABLET BY MOUTH TWICE DAILY.   METFORMIN (GLUCOPHAGE) 1000 MG TABLET    TAKE (1) TABLET BY MOUTH TWICE A DAY WITH MEALS (BREAKFAST AND SUPPER).   METOPROLOL SUCCINATE (TOPROL XL) 100 MG 24 HR TABLET    Take 1 tablet (100 mg total) by mouth daily. Take with or immediately following a meal.   METOPROLOL SUCCINATE (TOPROL XL) 25 MG 24 HR TABLET  Take 1 tablet (25 mg total) by mouth daily.   MULTIPLE VITAMIN (MULTIVITAMIN WITH MINERALS) TABS TABLET    Take 1 tablet by mouth daily.   POTASSIUM CHLORIDE SA (KLOR-CON) 20 MEQ TABLET    Take 2 tablets (40 mEq total) by mouth daily.   SACUBITRIL-VALSARTAN (ENTRESTO) 97-103 MG    Take 1 tablet by mouth 2 (two) times daily.   SPIRONOLACTONE (ALDACTONE) 25 MG TABLET    Take 1 tablet (25 mg total) by mouth daily.  Modified Medications   No medications on file  Discontinued Medications   NICOTINE (NICODERM CQ - DOSED IN MG/24 HOURS) 21 MG/24HR PATCH    Place 1 patch (21 mg total) onto the skin daily.    Physical Exam:  Vitals:   09/29/20 0938 09/29/20 0956  BP: (!) 160/100 130/82  Pulse: 86   Temp: 97.7 F (36.5 C)   SpO2: 98%   Weight: 266 lb 6.4 oz (120.8 kg)   Height: 6' (1.829 m)    Body mass index is 36.13 kg/m. Wt Readings from Last 3 Encounters:  09/29/20 266 lb 6.4 oz (120.8 kg)  06/28/20 267 lb (121.1 kg)  03/29/20 260 lb 6.4 oz (118.1 kg)    Physical Exam Constitutional:      General: He is not in acute distress.    Appearance: He is well-developed. He is not diaphoretic.  HENT:     Head: Normocephalic and atraumatic.     Mouth/Throat:     Pharynx: No oropharyngeal exudate.  Eyes:     Conjunctiva/sclera: Conjunctivae normal.     Pupils: Pupils are equal,  round, and reactive to light.  Cardiovascular:     Rate and Rhythm: Normal rate and regular rhythm.     Heart sounds: Normal heart sounds.  Pulmonary:     Effort: Pulmonary effort is normal.     Breath sounds: Normal breath sounds.  Abdominal:     General: Bowel sounds are normal.     Palpations: Abdomen is soft.  Musculoskeletal:        General: No tenderness.     Cervical back: Normal range of motion and neck supple.  Skin:    General: Skin is warm and dry.  Neurological:     Mental Status: He is alert and oriented to person, place, and time.     Labs reviewed: Basic Metabolic Panel: Recent Labs    10/14/19 1612 01/28/20 0958 06/28/20 1113  NA 142 138 140  K 4.6 4.6 4.6  CL 104 99 104  CO2 24 27 28   GLUCOSE 147* 151* 162*  BUN 13 14 16   CREATININE 1.17 1.16 0.82  CALCIUM 10.5* 10.4* 10.0   Liver Function Tests: Recent Labs    05/22/20 0740 06/28/20 1113  AST  --  16  ALT 23 29  BILITOT  --  0.9  PROT  --  6.1   No results for input(s): LIPASE, AMYLASE in the last 8760 hours. No results for input(s): AMMONIA in the last 8760 hours. CBC: Recent Labs    06/28/20 1113  WBC 8.8  NEUTROABS 5,650  HGB 14.9  HCT 44.2  MCV 91.9  PLT 273   Lipid Panel: Recent Labs    05/22/20 0740  CHOL 92*  HDL 39*  LDLCALC 33  TRIG 106  CHOLHDL 2.4   TSH: No results for input(s): TSH in the last 8760 hours. A1C: Lee Results  Component Value Date   HGBA1C 7.5 (H) 06/28/2020     Assessment/Plan  1. Type 2 diabetes mellitus with hyperglycemia, without long-term current use of insulin ([facility]) -Encouraged dietary and MEDICATION compliance, routine foot care/monitoring and to keep up with diabetic eye exams through ophthalmology  - discussed with the patient the pathophysiology of diabetes and the natural progression of the disease.  -stressed the importance of lifestyle changes including diet and exercise. -discussed complications associated with diabetes including  retinopathy, nephropathy, neuropathy as well as increased risk of cardiovascular disease. We went over the benefit seen with glycemic control.  - Hemoglobin X5M - BASIC METABOLIC PANEL WITH GFR  2. Morbid obesity ([place]) -education provided on healthy weight loss through increase in physical activity and proper nutrition.   3. Atherosclerosis of native coronary artery of native heart with angina pectoris ([facility]) Stable without chest pains, continues on ASA with lipitor and metoprolol.   4. Essential hypertension -improved on recheck. Continue current regimen.   5. Mixed hyperlipidemia -continues on Lipitor 80 mg daily. LDL controlled on last labs   6. Hx of bladder cancer Followed by urologist.   7. Chronic systolic CHF (congestive heart failure) ([place]) Overdue for cardiology follow up, encouraged to call and make appt. Missed last visit. Appears euvolemic at this time. He needs to work on dietary modifications and verbalizes this during office visit. Will continues current regimen at this time and encouraged compliance with diet and medications.    8. Back pain, unspecified back location, unspecified back pain laterality, unspecified chronicity Stable at this time. Encouraged good body positioning and not to do heavy lifting.  - Baclofen 5 MG TABS; Take 5 mg by mouth 2 (two) times daily as needed. Muscle spasm  Dispense: 30 tablet; Refill: 1  Next appt: 3 months.  Carlos American. [place], [facility] [phone]

## 2020-09-30 LAB — BASIC METABOLIC PANEL WITH GFR
BUN: 15 mg/dL (ref 7–25)
CO2: 30 mmol/L (ref 20–32)
Calcium: 10.1 mg/dL (ref 8.6–10.3)
Chloride: 102 mmol/L (ref 98–110)
Creat: 0.91 mg/dL (ref 0.70–1.25)
GFR, Est African American: 99 mL/min/{1.73_m2} (ref >=60)
GFR, Est Non African American: 86 mL/min/{1.73_m2} (ref >=60)
Glucose, Bld: 170 mg/dL — ABNORMAL HIGH (ref 65–99)
Potassium: 4.5 mmol/L (ref 3.5–5.3)
Sodium: 138 mmol/L (ref 135–146)

## 2020-09-30 LAB — HEMOGLOBIN A1C
Hgb A1c MFr Bld: 7.9 % of total Hgb — ABNORMAL HIGH (ref <5.7)
Mean Plasma Glucose: 180 mg/dL
eAG (mmol/L): 10 mmol/L

## 2020-10-03 MED ORDER — BACLOFEN 5 MG PO TABS: 5.0000 mg | ORAL_TABLET | Freq: Two times a day (BID) | ORAL | 1 refills | Status: DC | PRN

## 2020-11-06 ENCOUNTER — Other Ambulatory Visit: Payer: Self-pay | Admitting: Nurse Practitioner

## 2020-11-06 ENCOUNTER — Other Ambulatory Visit: Payer: Self-pay | Admitting: Cardiology

## 2020-11-06 DIAGNOSIS — E1165 Type 2 diabetes mellitus with hyperglycemia: Secondary | ICD-10-CM

## 2021-01-01 ENCOUNTER — Other Ambulatory Visit: Payer: Self-pay | Admitting: Nurse Practitioner

## 2021-01-01 DIAGNOSIS — E1165 Type 2 diabetes mellitus with hyperglycemia: Secondary | ICD-10-CM

## 2021-01-15 DIAGNOSIS — M25512 Pain in left shoulder: Secondary | ICD-10-CM | POA: Diagnosis not present

## 2021-01-17 ENCOUNTER — Other Ambulatory Visit: Payer: Self-pay | Admitting: Cardiology

## 2021-01-17 MED ORDER — ENTRESTO 97-103 MG PO TABS: 1.0000 | ORAL_TABLET | Freq: Two times a day (BID) | ORAL | 0 refills | Status: DC

## 2021-01-17 MED ORDER — FUROSEMIDE 20 MG PO TABS: 20.0000 mg | ORAL_TABLET | Freq: Every day | ORAL | 0 refills | Status: DC

## 2021-01-30 ENCOUNTER — Telehealth: Payer: Self-pay

## 2021-01-30 ENCOUNTER — Other Ambulatory Visit: Payer: Self-pay

## 2021-01-30 ENCOUNTER — Encounter: Payer: Self-pay | Admitting: Nurse Practitioner

## 2021-01-30 ENCOUNTER — Ambulatory Visit (INDEPENDENT_AMBULATORY_CARE_PROVIDER_SITE_OTHER): Payer: Medicare Other | Admitting: Nurse Practitioner

## 2021-01-30 DIAGNOSIS — Z0000 Encounter for general adult medical examination without abnormal findings: Secondary | ICD-10-CM

## 2021-01-30 DIAGNOSIS — F17200 Nicotine dependence, unspecified, uncomplicated: Secondary | ICD-10-CM

## 2021-01-30 DIAGNOSIS — Z1211 Encounter for screening for malignant neoplasm of colon: Secondary | ICD-10-CM

## 2021-01-30 DIAGNOSIS — M75122 Complete rotator cuff tear or rupture of left shoulder, not specified as traumatic: Secondary | ICD-10-CM | POA: Diagnosis not present

## 2021-01-30 DIAGNOSIS — Z1212 Encounter for screening for malignant neoplasm of rectum: Secondary | ICD-10-CM | POA: Diagnosis not present

## 2021-01-30 DIAGNOSIS — M25512 Pain in left shoulder: Secondary | ICD-10-CM | POA: Diagnosis not present

## 2021-01-30 NOTE — Telephone Encounter (Signed)
[name], stivers are scheduled for a virtual visit with your provider today.    Just as we do with appointments in the office, we must obtain your consent to participate.  Your consent will be active for this visit and any virtual visit you may have with one of our providers in the next 365 days.    If you have a MyChart account, I can also send a copy of this consent to you electronically.  All virtual visits are billed to your insurance company just like a traditional visit in the office.  As this is a virtual visit, video technology does not allow for your provider to perform a traditional examination.  This may limit your provider's ability to fully assess your condition.  If your provider identifies any concerns that need to be evaluated in person or the need to arrange testing such as labs, EKG, etc, we will make arrangements to do so.    Although advances in technology are sophisticated, we cannot ensure that it will always work on either your end or our end.  If the connection with a video visit is poor, we may have to switch to a telephone visit.  With either a video or telephone visit, we are not always able to ensure that we have a secure connection.   I need to obtain your verbal consent now.   Are you willing to proceed with your visit today?   [facility] Lee has provided verbal consent on 01/30/2021 for a virtual visit (video or telephone).   Carroll Kinds, [facility] 01/30/2021  8:37 AM

## 2021-01-30 NOTE — Patient Instructions (Signed)
Timothy Lee , Thank you for taking time to come for your Medicare Wellness Visit. I appreciate your ongoing commitment to your health goals. Please review the following plan we discussed and let me know if I can assist you in the future.   Screening recommendations/referrals: Colonoscopy due for cologuard- order sent Recommended yearly ophthalmology/optometry visit for glaucoma screening and checkup Recommended yearly dental visit for hygiene and checkup  Vaccinations: Influenza vaccine -you have declined.  Pneumococcal vaccine up to date Tdap vaccine- RECOMMENDED- to get at your local pharmacy Shingles vaccine RECOMMENDED- to get at your local pharmacy COVID- recommended to get at local pharmacy    Advanced directives: recommended to complete and bring to office once complete  Conditions/risks identified:  smoking, family hx of stroke, obesity.   Next appointment: 1 year for AWV  Preventive Care 69 Years and Older, Male Preventive care refers to lifestyle choices and visits with your health care provider that can promote health and wellness. What does preventive care include? A yearly physical exam. This is also called an annual well check. Dental exams once or twice a year. Routine eye exams. Ask your health care provider how often you should have your eyes checked. Personal lifestyle choices, including: Daily care of your teeth and gums. Regular physical activity. Eating a healthy diet. Avoiding tobacco and drug use. Limiting alcohol use. Practicing safe sex. Taking low doses of aspirin every day. Taking vitamin and mineral supplements as recommended by your health care provider. What happens during an annual well check? The services and screenings done by your health care provider during your annual well check will depend on your age, overall health, lifestyle risk factors, and family history of disease. Counseling  Your health care provider may ask you questions about  your: Alcohol use. Tobacco use. Drug use. Emotional well-being. Home and relationship well-being. Sexual activity. Eating habits. History of falls. Memory and ability to understand (cognition). Work and work Statistician. Screening  You may have the following tests or measurements: Height, weight, and BMI. Blood pressure. Lipid and cholesterol levels. These may be checked every 5 years, or more frequently if you are over 17 years old. Skin check. Lung cancer screening. You may have this screening every year starting at age 84 if you have a 30-pack-year history of smoking and currently smoke or have quit within the past 15 years. Fecal occult blood test (FOBT) of the stool. You may have this test every year starting at age 25. Flexible sigmoidoscopy or colonoscopy. You may have a sigmoidoscopy every 5 years or a colonoscopy every 10 years starting at age 46. Prostate cancer screening. Recommendations will vary depending on your family history and other risks. Hepatitis C blood test. Hepatitis B blood test. Sexually transmitted disease (STD) testing. Diabetes screening. This is done by checking your blood sugar (glucose) after you have not eaten for a while (fasting). You may have this done every 1-3 years. Abdominal aortic aneurysm (AAA) screening. You may need this if you are a current or former smoker. Osteoporosis. You may be screened starting at age 79 if you are at high risk. Talk with your health care provider about your test results, treatment options, and if necessary, the need for more tests. Vaccines  Your health care provider may recommend certain vaccines, such as: Influenza vaccine. This is recommended every year. Tetanus, diphtheria, and acellular pertussis (Tdap, Td) vaccine. You may need a Td booster every 10 years. Zoster vaccine. You may need this after age 21. Pneumococcal  13-valent conjugate (PCV13) vaccine. One dose is recommended after age 36. Pneumococcal  polysaccharide (PPSV23) vaccine. One dose is recommended after age 39. Talk to your health care provider about which screenings and vaccines you need and how often you need them. This information is not intended to replace advice given to you by your health care provider. Make sure you discuss any questions you have with your health care provider. Document Released: 06/02/2015 Document Revised: 01/24/2016 Document Reviewed: 03/07/2015 Elsevier Interactive Patient Education  2017 [place] Prevention in the Home Falls can cause injuries. They can happen to people of all ages. There are many things you can do to make your home safe and to help prevent falls. What can I do on the outside of my home? Regularly fix the edges of walkways and driveways and fix any cracks. Remove anything that might make you trip as you walk through a door, such as a raised step or threshold. Trim any bushes or trees on the path to your home. Use bright outdoor lighting. Clear any walking paths of anything that might make someone trip, such as rocks or tools. Regularly check to see if handrails are loose or broken. Make sure that both sides of any steps have handrails. Any raised decks and porches should have guardrails on the edges. Have any leaves, snow, or ice cleared regularly. Use sand or salt on walking paths during winter. Clean up any spills in your garage right away. This includes oil or grease spills. What can I do in the bathroom? Use night lights. Install grab bars by the toilet and in the tub and shower. Do not use towel bars as grab bars. Use non-skid mats or decals in the tub or shower. If you need to sit down in the shower, use a plastic, non-slip stool. Keep the floor dry. Clean up any water that spills on the floor as soon as it happens. Remove soap buildup in the tub or shower regularly. Attach bath mats securely with double-sided non-slip rug tape. Do not have throw rugs and other  things on the floor that can make you trip. What can I do in the bedroom? Use night lights. Make sure that you have a light by your bed that is easy to reach. Do not use any sheets or blankets that are too big for your bed. They should not hang down onto the floor. Have a firm chair that has side arms. You can use this for support while you get dressed. Do not have throw rugs and other things on the floor that can make you trip. What can I do in the kitchen? Clean up any spills right away. Avoid walking on wet floors. Keep items that you use a lot in easy-to-reach places. If you need to reach something above you, use a strong step stool that has a grab bar. Keep electrical cords out of the way. Do not use floor polish or wax that makes floors slippery. If you must use wax, use non-skid floor wax. Do not have throw rugs and other things on the floor that can make you trip. What can I do with my stairs? Do not leave any items on the stairs. Make sure that there are handrails on both sides of the stairs and use them. Fix handrails that are broken or loose. Make sure that handrails are as long as the stairways. Check any carpeting to make sure that it is firmly attached to the stairs. Fix any carpet  that is loose or worn. Avoid having throw rugs at the top or bottom of the stairs. If you do have throw rugs, attach them to the floor with carpet tape. Make sure that you have a light switch at the top of the stairs and the bottom of the stairs. If you do not have them, ask someone to add them for you. What else can I do to help prevent falls? Wear shoes that: Do not have high heels. Have rubber bottoms. Are comfortable and fit you well. Are closed at the toe. Do not wear sandals. If you use a stepladder: Make sure that it is fully opened. Do not climb a closed stepladder. Make sure that both sides of the stepladder are locked into place. Ask someone to hold it for you, if possible. Clearly  mark and make sure that you can see: Any grab bars or handrails. First and last steps. Where the edge of each step is. Use tools that help you move around (mobility aids) if they are needed. These include: Canes. Walkers. Scooters. Crutches. Turn on the lights when you go into a dark area. Replace any light bulbs as soon as they burn out. Set up your furniture so you have a clear path. Avoid moving your furniture around. If any of your floors are uneven, fix them. If there are any pets around you, be aware of where they are. Review your medicines with your doctor. Some medicines can make you feel dizzy. This can increase your chance of falling. Ask your doctor what other things that you can do to help prevent falls. This information is not intended to replace advice given to you by your health care provider. Make sure you discuss any questions you have with your health care provider. Document Released: 03/02/2009 Document Revised: 10/12/2015 Document Reviewed: 06/10/2014 Elsevier Interactive Patient Education  2017 [employer].

## 2021-01-30 NOTE — Progress Notes (Signed)
Subjective:   Timothy Lee is a 70 y.o. male who presents for Medicare Annual/Subsequent preventive examination.  Review of Systems     Cardiac Risk Factors include: advanced age (>53mn, >>14women);obesity (BMI >30kg/m2);hypertension;dyslipidemia;diabetes mellitus;male gender     Objective:    Today's Vitals   01/30/21 0844  PainSc: 7    There is no height or weight on file to calculate BMI.  Advanced Directives 01/30/2021 09/29/2020 06/28/2020 09/16/2019 08/25/2019 05/17/2019 04/14/2019  Does Patient Have a Medical Advance Directive? No No No No No No No  Would patient like information on creating a medical advance directive? - No - Patient declined Yes (MAU/Ambulatory/Procedural Areas - Information given) - Yes (MAU/Ambulatory/Procedural Areas - Information given) Yes (MAU/Ambulatory/Procedural Areas - Information given) No - Patient declined    Current Medications (verified) Outpatient Encounter Medications as of 01/30/2021  Medication Sig   ACCU-CHEK GUIDE test strip USE TO TEST BLOOD SUGAR DAILY.   Accu-Chek Softclix Lancets lancets USE TO TEST BLOOD SUGAR DAILY.   acetaminophen (TYLENOL) 500 MG tablet Take 2 tablets (1,000 mg total) by mouth every 8 (eight) hours as needed for moderate pain.   albuterol (VENTOLIN HFA) 108 (90 Base) MCG/ACT inhaler INHALE 2 PUFFS INTO THE LUNGS EVERY 6 HOURS AS NEEDED FOR WHEEZING OR SHORTNESS OF BREATH.   ANORO ELLIPTA 62.5-25 MCG/INH AEPB INHALE 1 PUFF INTO THE LUNGS ONCE DAILY.   aspirin 81 MG chewable tablet Chew 1 tablet (81 mg total) by mouth daily.   atorvastatin (LIPITOR) 80 MG tablet TAKE ONE TABLET BY MOUTH ONCE DAILY AT 6PM.   Baclofen 5 MG TABS Take 5 mg by mouth 2 (two) times daily as needed. Muscle spasm   dapagliflozin propanediol (FARXIGA) 10 MG TABS tablet Take 1 tablet (10 mg total) by mouth daily before breakfast.   furosemide (LASIX) 20 MG tablet Take 1 tablet (20 mg total) by mouth daily. Please make yearly appt with Dr.  TRadford Paxfor November 2022 for future refills. Thank you 1st attempt   glipiZIDE (GLUCOTROL) 5 MG tablet TAKE (1) TABLET BY MOUTH TWICE DAILY.   metFORMIN (GLUCOPHAGE) 1000 MG tablet TAKE (1) TABLET BY MOUTH TWICE A DAY WITH MEALS (BREAKFAST AND SUPPER).   metoprolol succinate (TOPROL XL) 100 MG 24 hr tablet Take 1 tablet (100 mg total) by mouth daily. Take with or immediately following a meal.   metoprolol succinate (TOPROL XL) 25 MG 24 hr tablet Take 1 tablet (25 mg total) by mouth daily.   Multiple Vitamin (MULTIVITAMIN WITH MINERALS) TABS tablet Take 1 tablet by mouth daily.   potassium chloride SA (KLOR-CON) 20 MEQ tablet Take 2 tablets (40 mEq total) by mouth daily. Please make yearly appt with Dr. TRadford Paxfor November 2022 for future refills. Thank you 1st attempt   sacubitril-valsartan (ENTRESTO) 97-103 MG Take 1 tablet by mouth 2 (two) times daily. Please make yearly appt with Dr. TRadford Paxfor November 2022 for future refills. Thank you 1st attempt   spironolactone (ALDACTONE) 25 MG tablet TAKE (1) TABLET BY MOUTH ONCE DAILY.   No facility-administered encounter medications on file as of 01/30/2021.    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, unspecified    Esophagus disorder    Constricted Esophagus, per New Patient Packet-PSC    History of basal cell carcinoma (BCC) excision    05/ 2018  right forehead   HLD (hyperlipidemia)    Hypertension    Mild acid reflux no meds   Nocturia    OSA  (obstructive sleep apnea)    per pt has not used cpap in years   Renal cyst, left 11/2011   16 mm   Sleep apnea    Per New Patient Packet-PSC    Smokers' cough ([place])    SOB (shortness of breath) on exertion    Past Surgical History:  Procedure Laterality Date   CYSTOSCOPY WITH BIOPSY  03/13/2012   Procedure: CYSTOSCOPY WITH BIOPSY;  Surgeon: Fredricka Bonine, MD;  Location: [facility];  Service: Urology;  Laterality: N/A;      ESOPHAGEAL DILATION  05/20/1986   Esophagus Stretch, per New Patient Packet-PSC    RIGHT/LEFT HEART CATH AND CORONARY ANGIOGRAPHY N/A 04/16/2019   Procedure: RIGHT/LEFT HEART CATH AND CORONARY ANGIOGRAPHY;  Surgeon: Troy Sine, MD;  Location: [place] [facility];  Service: Cardiovascular;  Laterality: N/A;   TONSILLECTOMY  age 15   TRANSURETHRAL RESECTION OF BLADDER TUMOR N/A 05/23/2017   Procedure: TRANSURETHRAL RESECTION OF BLADDER TUMOR / (TURBT) WITH INSTILLATION OF EPIRUBICIN;  Surgeon: [name], MD;  Location: [place];  Service: Urology;  Laterality: N/A;   UVULECTOMY  05/20/1990   Dr.Byers, per new patient packet    UVULOPALATOPHARYNGOPLASTY  2005   osa   Family History  Problem Relation Age of Onset   Diabetes Mother    Dementia Mother    Stroke Mother    Arthritis Mother    Hypertension Mother    Multiple sclerosis Father    Diabetes Father    Diabetes Sister    Hypertension Sister    ADD / ADHD Son    Migraines Daughter    ADD / ADHD Daughter    Cancer Maternal Grandmother    Social History   Socioeconomic History   Marital status: Married    Spouse name: Not on file   Number of children: Not on file   Years of education: Not on file   Highest education level: Not on file  Occupational History   Not on file  Tobacco Use   Smoking status: Some Days    Packs/day: 0.10    Years: 47.00    Pack years: 4.70    Types: Cigarettes   Smokeless tobacco: Former    Types: Chew, Snuff    Tobacco comments:    cutting back almost quit   Vaping Use   Vaping Use: Never used  Substance and Sexual Activity   Alcohol use: Yes    Comment: very seldom- rare   Drug use: Yes    Types: Marijuana    Comment: seldom-rare   Sexual activity: Not Currently  Other Topics Concern   Not on file  Social History Narrative   Diet: None      Caffeine: Yes      Married, if yes what year:  Yes, 1990      Do you live in a house, apartment, assisted living, condo, trailer, ect: House, 3 stories, 4 persons  Pets: 2 dogs, 2 cats       Highest level of education: Some College      Current/Past profession: Auctioneer       Exercise: Some         Living Will: Yes   DNR: Yes   POA/HPOA: Yes      Functional Status:   Do you have difficulty bathing or dressing yourself? No   Do you have difficulty preparing food or eating? No   Do you have difficulty managing your medications? No   Do you have difficulty managing your finances? No   Do you have difficulty affording your medications? No   Social Determinants of Radio broadcast assistant Strain: Not on file  Food Insecurity: Not on file  Transportation Needs: Not on file  Physical Activity: Not on file  Stress: Not on file  Social Connections: Not on file    Tobacco Counseling Ready to quit: Not Answered Counseling given: Not Answered Tobacco comments: cutting back almost quit    Clinical Intake:  Pre-visit preparation completed: Yes  Pain : 0-10 Pain Score: 7  Pain Type: Acute pain Pain Location: Shoulder Pain Orientation: Left Pain Descriptors / Indicators: Aching, Constant Pain Onset: 1 to 4 weeks ago Pain Frequency: Constant Pain Relieving Factors: cbd oil, medication.  Pain Relieving Factors: cbd oil, medication.  BMI - recorded: 36 Nutritional Status: BMI > 30  Obese Diabetes: Yes  How often do you need to have someone help you when you read instructions, pamphlets, or other written materials from  your doctor or pharmacy?: 1 - Never  Diabetic?yes         Activities of Daily Living In your present state of health, do you have any difficulty performing the following activities: 01/30/2021  Hearing? Y  Vision? N  Difficulty concentrating or making decisions? N  Walking or climbing stairs? N  Dressing or bathing? N  Doing errands, shopping? N  Preparing Food and eating ? N  Using the Toilet? N  In the past six months, have you accidently leaked urine? N  Do you have problems with loss of bowel control? N  Managing your Medications? N  Managing your Finances? N  Housekeeping or managing your Housekeeping? N  Some recent data might be hidden    Patient Care Team: Lauree Chandler, NP as PCP - General (Geriatric Medicine) Sueanne Margarita, MD as PCP - Cardiology (Cardiology) Melissa Montane, MD as Consulting Physician (Otolaryngology) [name], MD as Consulting Physician (Urology)  Indicate any recent Medical Services you may have received from other than [facility] providers in the past year (date may be approximate).     Assessment:   This is a routine wellness examination for [place].  Hearing/Vision screen Hearing Screening - Comments:: Patient has no hearing problems Vision Screening - Comments:: Patient wears glasses. Patient had eye exam within past 6 months. Patient can't remember eye doctor's name.  Dietary issues and exercise activities discussed: Current Exercise Habits: The patient does not participate in regular exercise at present   Goals Addressed   None    Depression Screen PHQ 2/9 Scores 01/30/2021 06/28/2020 09/16/2019 01/04/2019 09/18/2018 09/16/2017 08/19/2017  PHQ - 2 Score 0 0 0 0 0 0 0    Fall Risk Fall Risk  01/30/2021 06/28/2020 09/16/2019 05/17/2019 04/09/2019  Falls in the past year? 0 '1 1 1 '$ 0  Number falls in past yr: 0 0 1 0 0  Comment - - - Fall off boat -  Injury with Fall? 0 0 1 0 0  Risk for fall due to : No Fall Risks - - - -  Follow up  Falls evaluation completed - - - -    FALL RISK PREVENTION PERTAINING TO THE HOME:  Any stairs in or around the home? Yes  If so, are there any without handrails? No  Home free of loose throw rugs in walkways, pet beds, electrical cords, etc? No  Adequate lighting in your home to reduce risk of falls? Yes   ASSISTIVE DEVICES UTILIZED TO PREVENT FALLS:  Life alert? No  Use of a cane, walker or w/c? No  Grab bars in the bathroom? No  Shower chair or bench in shower? No  Elevated toilet seat or a handicapped toilet? No   TIMED UP AND GO:  Was the test performed? No .    Cognitive Function:     6CIT Screen 01/30/2021 09/16/2019  What Year? 0 points 0 points  What month? 0 points 0 points  What time? 0 points 0 points  Count back from 20 0 points 0 points  Months in reverse 0 points 0 points  Repeat phrase 2 points 2 points  Total Score 2 2    Immunizations Immunization History  Administered Date(s) Administered   PFIZER(Purple Top)SARS-COV-2 Vaccination 12/22/2019, 01/30/2020   Pneumococcal Conjugate-13 09/16/2017   Pneumococcal Polysaccharide-23 12/21/2018    TDAP status: Due, Education has been provided regarding the importance of this vaccine. Advised may receive this vaccine at local pharmacy or Health Dept. Aware to provide a copy of the vaccination record if obtained from local pharmacy or Health Dept. Verbalized acceptance and understanding.  Flu Vaccine status: Due, Education has been provided regarding the importance of this vaccine. Advised may receive this vaccine at local pharmacy or Health Dept. Aware to provide a copy of the vaccination record if obtained from local pharmacy or Health Dept. Verbalized acceptance and understanding.  Pneumococcal vaccine status: Up to date  Covid-19 vaccine status: Information provided on how to obtain vaccines.   Qualifies for Shingles Vaccine? Yes   Zostavax completed No   Shingrix Completed?: Yes  Screening  Tests Health Maintenance  Topic Date Due   OPHTHALMOLOGY EXAM  Never done   TETANUS/TDAP  Never done   Zoster Vaccines- Shingrix (1 of 2) Never done   COVID-19 Vaccine (3 - Booster for Pfizer series) 07/01/2020   Fecal DNA (Cologuard)  12/24/2020   HEMOGLOBIN A1C  04/01/2021   FOOT EXAM  06/28/2021   PNA vac Low Risk Adult  Completed   HPV VACCINES  Aged Out   INFLUENZA VACCINE  Discontinued   Hepatitis C Screening  Discontinued    Health Maintenance  Health Maintenance Due  Topic Date Due   OPHTHALMOLOGY EXAM  Never done   TETANUS/TDAP  Never done   Zoster Vaccines- Shingrix (1 of 2) Never done   COVID-19 Vaccine (3 - Booster for Pfizer series) 07/01/2020   Fecal DNA (Cologuard)  12/24/2020    Colorectal cancer screening: Type of screening: Cologuard. Completed 2019. Repeat every 3 years  Lung Cancer Screening: (Low Dose CT Chest recommended if Age 36-80 years, 30 pack-year currently smoking OR have quit w/in 15years.) does qualify.   Lung Cancer Screening Referral: completed   Additional Screening:  Hepatitis C Screening: does qualify; Complete with next blood work  Vision Screening: Recommended annual ophthalmology exams for early detection of glaucoma and other disorders of the eye. Is the patient up to date with their  annual eye exam?  Yes  Who is the provider or what is the name of the office in which the patient attends annual eye exams? [facility] If pt is not established with a provider, would they like to be referred to a provider to establish care? No .   Dental Screening: Recommended annual dental exams for proper oral hygiene  Community Resource Referral / Chronic Care Management: CRR required this visit?  No   CCM required this visit?  No      Plan:     I have personally reviewed and noted the following in the patient's chart:   Medical and social history Use of alcohol, tobacco or illicit drugs  Current medications and supplements including opioid  prescriptions. Patient is not currently taking opioid prescriptions. Functional ability and status Nutritional status Physical activity Advanced directives List of other physicians Hospitalizations, surgeries, and ER visits in previous 12 months Vitals Screenings to include cognitive, depression, and falls Referrals and appointments  In addition, I have reviewed and discussed with patient certain preventive protocols, quality metrics, and best practice recommendations. A written personalized care plan for preventive services as well as general preventive health recommendations were provided to patient.     Lauree Chandler, NP   01/30/2021    Virtual Visit via Telephone Note  I connected withNAME@ on 01/30/21 at  8:30 AM EDT by telephone and verified that I am speaking with the correct person using two identifiers.  Location: Patient: home Provider: [facility]   I discussed the limitations, risks, security and privacy concerns of performing an evaluation and management service by telephone and the availability of in person appointments. I also discussed with the patient that there may be a patient responsible charge related to this service. The patient expressed understanding and agreed to proceed.   I discussed the assessment and treatment plan with the patient. The patient was provided an opportunity to ask questions and all were answered. The patient agreed with the plan and demonstrated an understanding of the instructions.   The patient was advised to call back or seek an in-person evaluation if the symptoms worsen or if the condition fails to improve as anticipated.  I provided 18 minutes of non-face-to-face time during this encounter.  Carlos American. Harle Battiest Avs printed and mailed

## 2021-01-30 NOTE — Progress Notes (Signed)
This service is provided via telemedicine  No vital signs collected/recorded due to the encounter was a telemedicine visit.   Location of patient (ex: home, work):  Home  Patient consents to a telephone visit:  Yes, see encounter dated 01/30/2021  Location of the provider (ex: office, home):  [place]  Name of any referring provider:  N/A  Names of all persons participating in the telemedicine service and their role in the encounter:  Sherrie Mustache, Nurse Practitioner, Carroll Kinds, CMA, and patient.   Time spent on call:  13 minutes with medical assistant

## 2021-02-01 ENCOUNTER — Telehealth: Payer: Self-pay

## 2021-02-01 NOTE — Telephone Encounter (Signed)
Left message for patient to call office for cologuard result, which is negative.

## 2021-02-02 ENCOUNTER — Ambulatory Visit: Payer: Medicare Other | Admitting: Nurse Practitioner

## 2021-02-03 ENCOUNTER — Other Ambulatory Visit: Payer: Self-pay | Admitting: Cardiology

## 2021-02-03 DIAGNOSIS — E1165 Type 2 diabetes mellitus with hyperglycemia: Secondary | ICD-10-CM

## 2021-02-07 ENCOUNTER — Other Ambulatory Visit: Payer: Self-pay

## 2021-02-07 ENCOUNTER — Encounter: Payer: Self-pay | Admitting: Nurse Practitioner

## 2021-02-07 ENCOUNTER — Ambulatory Visit (INDEPENDENT_AMBULATORY_CARE_PROVIDER_SITE_OTHER): Payer: Medicare Other | Admitting: Nurse Practitioner

## 2021-02-07 VITALS — BP 124/78 | HR 84 | Temp 97.3°F | Ht 72.0 in | Wt 260.0 lb

## 2021-02-07 DIAGNOSIS — I5022 Chronic systolic (congestive) heart failure: Secondary | ICD-10-CM

## 2021-02-07 DIAGNOSIS — I10 Essential (primary) hypertension: Secondary | ICD-10-CM

## 2021-02-07 DIAGNOSIS — M25512 Pain in left shoulder: Secondary | ICD-10-CM | POA: Diagnosis not present

## 2021-02-07 DIAGNOSIS — F17200 Nicotine dependence, unspecified, uncomplicated: Secondary | ICD-10-CM

## 2021-02-07 DIAGNOSIS — E782 Mixed hyperlipidemia: Secondary | ICD-10-CM | POA: Diagnosis not present

## 2021-02-07 DIAGNOSIS — I25119 Atherosclerotic heart disease of native coronary artery with unspecified angina pectoris: Secondary | ICD-10-CM | POA: Diagnosis not present

## 2021-02-07 DIAGNOSIS — E1165 Type 2 diabetes mellitus with hyperglycemia: Secondary | ICD-10-CM | POA: Diagnosis not present

## 2021-02-07 NOTE — Patient Instructions (Addendum)
Sign record release for  ophthalmology (Dr Katy Fitch)   Call and schedule cardiology follow up.   Schedule fasting lab work.

## 2021-02-07 NOTE — Progress Notes (Signed)
Careteam: Patient Care Team: Lauree Chandler, NP as PCP - General (Geriatric Medicine) Sueanne Margarita, MD as PCP - Cardiology (Cardiology) Melissa Montane, MD as Consulting Physician (Otolaryngology) [name], MD as Consulting Physician (Urology)  PLACE OF SERVICE:  [place] Directive information Does Patient Have a Medical Advance Directive?: No, Would patient like information on creating a medical advance directive?: No - Patient declined  Allergies  Allergen Reactions   Chantix [Varenicline Tartrate]     Aggression     Chief Complaint  Patient presents with   Medical Management of Chronic Issues    3 month follow-up. Discuss need for shingrix, cologuard, td/tdap, covid vaccines. Per pateint eye exam up to date.Discuss left shoulder pain, re-injured 5 weeks ago.      HPI: Patient is a 70 y.o. male for routine follow up.  Saw orthopedic due to left shoulder pain. Slept on it wrong and used voltaren but overall pain is much better. Had injection and tramadol. Injection only last a few hours. Tramadol dulled the pain.  Has muscle relaxer that helped.  Used baclofen prior to seeing orthopedic.  Needs a shoulder replacement.   DM- reports he is taking medication "better" will miss 1-2 doses weekly. On metformin, glipizide and farxiga.   Htn/chf- no chest pains or shortness of breath, no LE edema.   COPD- continues to smoke- without worsening shortness of breath.   Smoking- was down to 4 cigarettes a day now 1/2 ppd. Trying to quit. Does not wish to take medication at this time.    Review of Systems:  Review of Systems  Constitutional:  Negative for chills, fever and weight loss.  HENT:  Negative for tinnitus.   Respiratory:  Negative for cough, sputum production and shortness of breath.   Cardiovascular:  Negative for chest pain, palpitations and leg swelling.  Gastrointestinal:  Negative for abdominal pain, constipation, diarrhea and heartburn.   Genitourinary:  Negative for dysuria, frequency and urgency.  Musculoskeletal:  Negative for back pain, falls, joint pain and myalgias.  Skin: Negative.   Neurological:  Negative for dizziness and headaches.  Psychiatric/Behavioral:  Negative for depression and memory loss. The patient does not have insomnia.    Past Medical History:  Diagnosis Date   Arthritis    Bladder cancer [facility]) urologist-  dr Kipp Brood   Borderline diabetes    Bursitis of both shoulders    CAD (coronary artery disease), native coronary artery    Cath 03/2019 with 20% prox and 60% mid LAD stenosis >>medical management recommended   Chronic systolic CHF (congestive heart failure), NYHA class 2 (HCC)    EF 29% by cMRI   Diabetes mellitus without complication ([place])    Disorder of male genital organs, unspecified    Esophagus disorder    Constricted Esophagus, per New Patient Packet-PSC    History of basal cell carcinoma (BCC) excision    05/ 2018  right forehead   HLD (hyperlipidemia)    Hypertension    Mild acid reflux no meds   Nocturia    OSA (obstructive sleep apnea)    per pt has not used cpap in years   Renal cyst, left 11/2011   16 mm   Sleep apnea    Per New Patient Packet-PSC    Smokers' cough ([place])    SOB (shortness of breath) on exertion    Past Surgical History:  Procedure Laterality Date   CYSTOSCOPY WITH BIOPSY  03/13/2012   Procedure: CYSTOSCOPY  WITH BIOPSY;  Surgeon: Fredricka Bonine, MD;  Location: [facility];  Service: Urology;  Laterality: N/A;      ESOPHAGEAL DILATION  05/20/1986   Esophagus Stretch, per New Patient Packet-PSC    RIGHT/LEFT HEART CATH AND CORONARY ANGIOGRAPHY N/A 04/16/2019   Procedure: RIGHT/LEFT HEART CATH AND CORONARY ANGIOGRAPHY;  Surgeon: Troy Sine, MD;  Location: [facility];  Service: Cardiovascular;  Laterality: N/A;   TONSILLECTOMY  age 78   TRANSURETHRAL RESECTION OF BLADDER TUMOR N/A 05/23/2017   Procedure: TRANSURETHRAL  RESECTION OF BLADDER TUMOR / (TURBT) WITH INSTILLATION OF EPIRUBICIN;  Surgeon: [name], MD;  Location: [facility];  Service: Urology;  Laterality: N/A;   UVULECTOMY  05/20/1990   Dr.Byers, per new patient packet    UVULOPALATOPHARYNGOPLASTY  2005   osa   Social History:   reports that he has been smoking cigarettes. He has a 23.50 pack-year smoking history. He quit smokeless tobacco use about 52 years ago.  His smokeless tobacco use included chew. He reports current alcohol use. He reports current drug use. Drug: Marijuana.  Family History  Problem Relation Age of Onset   Diabetes Mother    Dementia Mother    Stroke Mother    Arthritis Mother    Hypertension Mother    Multiple sclerosis Father    Diabetes Father    Diabetes Sister    Hypertension Sister    ADD / ADHD Son    Migraines Daughter    ADD / ADHD Daughter    Cancer Maternal Grandmother     Medications: Patient's Medications  New Prescriptions   No medications on file  Previous Medications   ACCU-CHEK GUIDE TEST STRIP    USE TO TEST BLOOD SUGAR DAILY.   ACCU-CHEK SOFTCLIX LANCETS LANCETS    USE TO TEST BLOOD SUGAR DAILY.   ACETAMINOPHEN (TYLENOL) 500 MG TABLET    Take 2 tablets (1,000 mg total) by mouth every 8 (eight) hours as needed for moderate pain.   ALBUTEROL (VENTOLIN HFA) 108 (90 BASE) MCG/ACT INHALER    INHALE 2 PUFFS INTO THE LUNGS EVERY 6 HOURS AS NEEDED FOR WHEEZING OR SHORTNESS OF BREATH.   ANORO ELLIPTA 62.5-25 MCG/INH AEPB    INHALE 1 PUFF INTO THE LUNGS ONCE DAILY.   ASPIRIN 81 MG CHEWABLE TABLET    Chew 1 tablet (81 mg total) by mouth daily.   ATORVASTATIN (LIPITOR) 80 MG TABLET    TAKE ONE TABLET BY MOUTH ONCE DAILY AT 6PM.   BACLOFEN 5 MG TABS    Take 5 mg by mouth 2 (two) times daily as needed. Muscle spasm   CARISOPRODOL (SOMA) 350 MG TABLET    Take 350 mg by mouth at bedtime as needed.   DAPAGLIFLOZIN PROPANEDIOL (FARXIGA) 10 MG TABS TABLET    Take 1 tablet (10 mg  total) by mouth daily before breakfast.   FUROSEMIDE (LASIX) 20 MG TABLET    Take 1 tablet (20 mg total) by mouth daily. Please make yearly appt with Dr. Radford Pax for November 2022 for future refills. Thank you 1st attempt   GLIPIZIDE (GLUCOTROL) 5 MG TABLET    TAKE (1) TABLET BY MOUTH TWICE DAILY.   METFORMIN (GLUCOPHAGE) 1000 MG TABLET    TAKE (1) TABLET BY MOUTH TWICE A DAY WITH MEALS (BREAKFAST AND SUPPER).   METOPROLOL SUCCINATE (TOPROL XL) 100 MG 24 HR TABLET    Take 1 tablet (100 mg total) by mouth daily. Take with or immediately  following a meal.   METOPROLOL SUCCINATE (TOPROL XL) 25 MG 24 HR TABLET    Take 1 tablet (25 mg total) by mouth daily.   MULTIPLE VITAMIN (MULTIVITAMIN WITH MINERALS) TABS TABLET    Take 1 tablet by mouth daily.   POTASSIUM CHLORIDE SA (KLOR-CON) 20 MEQ TABLET    Take 2 tablets (40 mEq total) by mouth daily. Please make yearly appt with Dr. Radford Pax for November 2022 for future refills. Thank you 1st attempt   SACUBITRIL-VALSARTAN (ENTRESTO) 97-103 MG    Take 1 tablet by mouth 2 (two) times daily. Please make yearly appt with Dr. Radford Pax for November 2022 for future refills. Thank you 1st attempt   SPIRONOLACTONE (ALDACTONE) 25 MG TABLET    TAKE (1) TABLET BY MOUTH ONCE DAILY.  Modified Medications   No medications on file  Discontinued Medications   No medications on file    Physical Exam:  Vitals:   02/07/21 1057  BP: 124/78  Pulse: 84  Temp: (!) 97.3 F (36.3 C)  TempSrc: Temporal  SpO2: 97%  Weight: 260 lb (117.9 kg)  Height: 6' (1.829 m)   Body mass index is 35.26 kg/m. Wt Readings from Last 3 Encounters:  02/07/21 260 lb (117.9 kg)  09/29/20 266 lb 6.4 oz (120.8 kg)  06/28/20 267 lb (121.1 kg)    Physical Exam Constitutional:      General: He is not in acute distress.    Appearance: He is well-developed. He is not diaphoretic.  HENT:     Head: Normocephalic and atraumatic.     Right Ear: External ear normal.     Left Ear: External ear  normal.     Mouth/Throat:     Pharynx: No oropharyngeal exudate.  Eyes:     Conjunctiva/sclera: Conjunctivae normal.     Pupils: Pupils are equal, round, and reactive to light.  Cardiovascular:     Rate and Rhythm: Normal rate and regular rhythm.     Heart sounds: Normal heart sounds.  Pulmonary:     Effort: Pulmonary effort is normal.     Breath sounds: Normal breath sounds.  Abdominal:     General: Bowel sounds are normal.     Palpations: Abdomen is soft.  Musculoskeletal:        General: No tenderness.     Cervical back: Normal range of motion and neck supple.     Right lower leg: No edema.     Left lower leg: No edema.  Skin:    General: Skin is warm and dry.  Neurological:     Mental Status: He is alert and oriented to person, place, and time.    Labs reviewed: Basic Metabolic Panel: Recent Labs    06/28/20 1113 09/29/20 1006  NA 140 138  K 4.6 4.5  CL 104 102  CO2 28 30  GLUCOSE 162* 170*  BUN 16 15  CREATININE 0.82 0.91  CALCIUM 10.0 10.1   Liver Function Tests: Recent Labs    05/22/20 0740 06/28/20 1113  AST  --  16  ALT 23 29  BILITOT  --  0.9  PROT  --  6.1   No results for input(s): LIPASE, AMYLASE in the last 8760 hours. No results for input(s): AMMONIA in the last 8760 hours. CBC: Recent Labs    06/28/20 1113  WBC 8.8  NEUTROABS 5,650  HGB 14.9  HCT 44.2  MCV 91.9  PLT 273   Lipid Panel: Recent Labs    05/22/20 0740  CHOL 92*  HDL 39*  LDLCALC 33  TRIG 106  CHOLHDL 2.4   TSH: No results for input(s): TSH in the last 8760 hours. A1C: Lab Results  Component Value Date   HGBA1C 7.9 (H) 09/29/2020     Assessment/Plan 1. Type 2 diabetes mellitus with hyperglycemia, without long-term current use of insulin (HCC) -compliance is an issue for medication. Encouraged dietary compliance and medication compliance. Educated on importance of routine foot care/monitoring and to keep up with diabetic eye exams through ophthalmology  -  Hemoglobin A1c; Future  2. Essential hypertension --stable. Goal bp <140/90. Continue on current regimen with low sodium diet.  - CBC with Differential/Platelet; Future - CMP with eGFR([facility]); Future  3. Mixed hyperlipidemia -cholesterol at goal on last labs. Continue statin and dietary modifications. - Lipid Panel; Future  4. Smoker Encouraged cessation, he does not wish to take medication to quit, intolerant to chantix  5. Morbid obesity ([place]) -education provided on healthy weight loss through increase in physical activity and proper nutrition   6. Atherosclerosis of native coronary artery of native heart with angina pectoris (HCC) Stable, without chest pains, recommend follow up with cardiologist at this time. Continues on statin and asa.  7. Chronic systolic CHF (congestive heart failure) ([place]) Euvolemic, overdue follow up with cardiology. Symptoms managed with medication at this time. Will continue current regimen.   8. Acute pain of left shoulder -symptoms have improved with rest and injection. Had tramadol PRN and muscle relaxer.    Next appt: 4 months, labs at appt.  Carlos American. [place], [facility] [phone]

## 2021-02-08 ENCOUNTER — Other Ambulatory Visit: Payer: Medicare Other

## 2021-02-08 ENCOUNTER — Telehealth: Payer: Self-pay | Admitting: *Deleted

## 2021-02-08 DIAGNOSIS — I10 Essential (primary) hypertension: Secondary | ICD-10-CM | POA: Diagnosis not present

## 2021-02-08 DIAGNOSIS — E782 Mixed hyperlipidemia: Secondary | ICD-10-CM

## 2021-02-08 DIAGNOSIS — E1165 Type 2 diabetes mellitus with hyperglycemia: Secondary | ICD-10-CM

## 2021-02-08 NOTE — Telephone Encounter (Signed)
   [facility] Pre-operative Risk Assessment    Patient Name: Timothy Lee  DOB: 17-Jun-1950 MRN: [id number]  HEARTCARE STAFF:  - IMPORTANT!!!!!! Under Visit Info/Reason for Call, type in Other and utilize the format Clearance MM/DD/YY or Clearance TBD. Do not use dashes or single digits. - Please review there is not already an duplicate clearance open for this procedure. - If request is for dental extraction, please clarify the # of teeth to be extracted. - If the patient is currently at the dentist's office, call Pre-Op Callback Staff (MA/nurse) to input urgent request.  - If the patient is not currently in the dentist office, please route to the Pre-Op pool.  Request for surgical clearance:  What type of surgery is being performed? LEFT SHOULDER ARTHROSCOPY  When is this surgery scheduled? TBD  What type of clearance is required (medical clearance vs. Pharmacy clearance to hold med vs. Both)? MEDICAL  Are there any medications that need to be held prior to surgery and how long? ASA  Practice name and name of physician performing surgery? [facility]; DR. Lennette Bihari SUPPLE  What is the office phone number? [phone]   7.   What is the office fax number? [phone] ATTN: [place]  8.   Anesthesia type (None, local, MAC, general) ? GENERAL   Julaine Hua 02/08/2021, 1:23 PM  _________________________________________________________________   (provider comments below)

## 2021-02-08 NOTE — Telephone Encounter (Signed)
The patient has office visit with Dr. Radford Pax 02/12/21.  Will review surgical clearance at that time.

## 2021-02-09 ENCOUNTER — Other Ambulatory Visit: Payer: Self-pay

## 2021-02-09 ENCOUNTER — Other Ambulatory Visit: Payer: Self-pay | Admitting: Nurse Practitioner

## 2021-02-09 DIAGNOSIS — E782 Mixed hyperlipidemia: Secondary | ICD-10-CM

## 2021-02-09 DIAGNOSIS — I10 Essential (primary) hypertension: Secondary | ICD-10-CM

## 2021-02-09 DIAGNOSIS — E1165 Type 2 diabetes mellitus with hyperglycemia: Secondary | ICD-10-CM

## 2021-02-09 DIAGNOSIS — I5022 Chronic systolic (congestive) heart failure: Secondary | ICD-10-CM

## 2021-02-09 LAB — HEMOGLOBIN A1C
Hgb A1c MFr Bld: 8.3 % of total Hgb — ABNORMAL HIGH (ref <5.7)
Mean Plasma Glucose: 192 mg/dL
eAG (mmol/L): 10.6 mmol/L

## 2021-02-09 LAB — CBC WITH DIFFERENTIAL/PLATELET
Absolute Monocytes: 705 cells/uL (ref 200–950)
Basophils Absolute: 26 cells/uL (ref 0–200)
Basophils Relative: 0.3 %
Eosinophils Absolute: 531 cells/uL — ABNORMAL HIGH (ref 15–500)
Eosinophils Relative: 6.1 %
HCT: 47 % (ref 38.5–50.0)
Hemoglobin: 15.9 g/dL (ref 13.2–17.1)
Lymphs Abs: 1905 cells/uL (ref 850–3900)
MCH: 30.9 pg (ref 27.0–33.0)
MCHC: 33.8 g/dL (ref 32.0–36.0)
MCV: 91.4 fL (ref 80.0–100.0)
MPV: 10.1 fL (ref 7.5–12.5)
Monocytes Relative: 8.1 %
Neutro Abs: 5533 cells/uL (ref 1500–7800)
Neutrophils Relative %: 63.6 %
Platelets: 304 10*3/uL (ref 140–400)
RBC: 5.14 10*6/uL (ref 4.20–5.80)
RDW: 12.1 % (ref 11.0–15.0)
Total Lymphocyte: 21.9 %
WBC: 8.7 10*3/uL (ref 3.8–10.8)

## 2021-02-09 LAB — COMPLETE METABOLIC PANEL WITH GFR
AG Ratio: 2 (calc) (ref 1.0–2.5)
ALT: 28 U/L (ref 9–46)
AST: 14 U/L (ref 10–35)
Albumin: 4.3 g/dL (ref 3.6–5.1)
Alkaline phosphatase (APISO): 47 U/L (ref 35–144)
BUN: 17 mg/dL (ref 7–25)
CO2: 26 mmol/L (ref 20–32)
Calcium: 10.2 mg/dL (ref 8.6–10.3)
Chloride: 103 mmol/L (ref 98–110)
Creat: 0.97 mg/dL (ref 0.70–1.35)
Globulin: 2.1 g/dL (calc) (ref 1.9–3.7)
Glucose, Bld: 176 mg/dL — ABNORMAL HIGH (ref 65–99)
Potassium: 4.5 mmol/L (ref 3.5–5.3)
Sodium: 138 mmol/L (ref 135–146)
Total Bilirubin: 1.2 mg/dL (ref 0.2–1.2)
Total Protein: 6.4 g/dL (ref 6.1–8.1)
eGFR: 85 mL/min/{1.73_m2} (ref >=60)

## 2021-02-09 LAB — LIPID PANEL
Cholesterol: 102 mg/dL (ref <200)
HDL: 39 mg/dL — ABNORMAL LOW (ref >=40)
LDL Cholesterol (Calc): 37 mg/dL (calc)
Non-HDL Cholesterol (Calc): 63 mg/dL (calc) (ref <130)
Total CHOL/HDL Ratio: 2.6 (calc) (ref <5.0)
Triglycerides: 179 mg/dL — ABNORMAL HIGH (ref <150)

## 2021-02-09 MED ORDER — TRULICITY 0.75 MG/0.5ML ~~LOC~~ SOAJ
0.7500 mg | SUBCUTANEOUS | 1 refills | Status: DC
Start: 2021-02-09 — End: 2021-08-13

## 2021-02-09 NOTE — Addendum Note (Signed)
Addended by: Carroll Kinds on: 02/09/2021 03:39 PM   Modules accepted: Orders

## 2021-02-12 ENCOUNTER — Encounter: Payer: Self-pay | Admitting: Cardiology

## 2021-02-12 ENCOUNTER — Other Ambulatory Visit: Payer: Self-pay

## 2021-02-12 ENCOUNTER — Ambulatory Visit: Payer: Medicare Other | Admitting: Cardiology

## 2021-02-12 VITALS — BP 132/84 | Ht 72.0 in | Wt 258.6 lb

## 2021-02-12 DIAGNOSIS — Z720 Tobacco use: Secondary | ICD-10-CM

## 2021-02-12 DIAGNOSIS — G4733 Obstructive sleep apnea (adult) (pediatric): Secondary | ICD-10-CM | POA: Diagnosis not present

## 2021-02-12 DIAGNOSIS — I5022 Chronic systolic (congestive) heart failure: Secondary | ICD-10-CM

## 2021-02-12 DIAGNOSIS — I420 Dilated cardiomyopathy: Secondary | ICD-10-CM | POA: Diagnosis not present

## 2021-02-12 DIAGNOSIS — Z01818 Encounter for other preprocedural examination: Secondary | ICD-10-CM

## 2021-02-12 DIAGNOSIS — I10 Essential (primary) hypertension: Secondary | ICD-10-CM | POA: Diagnosis not present

## 2021-02-12 DIAGNOSIS — Z01810 Encounter for preprocedural cardiovascular examination: Secondary | ICD-10-CM

## 2021-02-12 DIAGNOSIS — I2510 Atherosclerotic heart disease of native coronary artery without angina pectoris: Secondary | ICD-10-CM | POA: Diagnosis not present

## 2021-02-12 DIAGNOSIS — M25512 Pain in left shoulder: Secondary | ICD-10-CM | POA: Diagnosis not present

## 2021-02-12 DIAGNOSIS — E7800 Pure hypercholesterolemia, unspecified: Secondary | ICD-10-CM

## 2021-02-12 LAB — COMPREHENSIVE METABOLIC PANEL
ALT: 26 IU/L (ref 0–44)
AST: 19 IU/L (ref 0–40)
Albumin/Globulin Ratio: 2.7 — ABNORMAL HIGH (ref 1.2–2.2)
Albumin: 4.8 g/dL (ref 3.8–4.8)
Alkaline Phosphatase: 57 IU/L (ref 44–121)
BUN/Creatinine Ratio: 14 (ref 10–24)
BUN: 13 mg/dL (ref 8–27)
Bilirubin Total: 1 mg/dL (ref 0.0–1.2)
CO2: 23 mmol/L (ref 20–29)
Calcium: 10.2 mg/dL (ref 8.6–10.2)
Chloride: 98 mmol/L (ref 96–106)
Creatinine, Ser: 0.93 mg/dL (ref 0.76–1.27)
Globulin, Total: 1.8 g/dL (ref 1.5–4.5)
Glucose: 200 mg/dL — ABNORMAL HIGH (ref 70–99)
Potassium: 4.4 mmol/L (ref 3.5–5.2)
Sodium: 139 mmol/L (ref 134–144)
Total Protein: 6.6 g/dL (ref 6.0–8.5)
eGFR: 89 mL/min/{1.73_m2} (ref >59)

## 2021-02-12 LAB — LIPID PANEL
Chol/HDL Ratio: 2.3 ratio (ref 0.0–5.0)
Cholesterol, Total: 91 mg/dL — ABNORMAL LOW (ref 100–199)
HDL: 39 mg/dL — ABNORMAL LOW (ref >39)
LDL Chol Calc (NIH): 30 mg/dL (ref 0–99)
Triglycerides: 121 mg/dL (ref 0–149)
VLDL Cholesterol Cal: 22 mg/dL (ref 5–40)

## 2021-02-12 MED ORDER — FUROSEMIDE 20 MG PO TABS: 20.0000 mg | ORAL_TABLET | Freq: Every day | ORAL | 2 refills | Status: DC

## 2021-02-12 MED ORDER — ATORVASTATIN CALCIUM 80 MG PO TABS: ORAL_TABLET | ORAL | 2 refills | Status: DC

## 2021-02-12 MED ORDER — SPIRONOLACTONE 25 MG PO TABS: ORAL_TABLET | ORAL | 3 refills | Status: DC

## 2021-02-12 MED ORDER — POTASSIUM CHLORIDE CRYS ER 20 MEQ PO TBCR: 40.0000 meq | EXTENDED_RELEASE_TABLET | Freq: Every day | ORAL | 2 refills | Status: DC

## 2021-02-12 MED ORDER — DAPAGLIFLOZIN PROPANEDIOL 10 MG PO TABS: 10.0000 mg | ORAL_TABLET | Freq: Every day | ORAL | 2 refills | Status: DC

## 2021-02-12 MED ORDER — METOPROLOL SUCCINATE ER 25 MG PO TB24: 25.0000 mg | ORAL_TABLET | Freq: Every day | ORAL | 3 refills | Status: DC

## 2021-02-12 MED ORDER — METOPROLOL SUCCINATE ER 100 MG PO TB24: 100.0000 mg | ORAL_TABLET | Freq: Every day | ORAL | 3 refills | Status: DC

## 2021-02-12 NOTE — Progress Notes (Signed)
Cardiology Office Note:    Date:  02/12/2021   ID:  SHARRON SIMPSON, DOB [birth year], MRN [id number]  PCP:  Lauree Chandler, NP  Cardiologist:  Fransico Him, MD    Referring MD: Lauree Chandler, NP   Chief Complaint  Patient presents with   Coronary Artery Disease   Hypertension   Hyperlipidemia   Congestive Heart Failure   Cardiomyopathy     History of Present Illness:    Timothy Lee is a 70 y.o. male with a hx of hypertension, DM2, untreated OSA, COPD and longstanding tobacco use.  He was admitted 03/2019 for acute CHF and COPD exacerbation. Echocardiogram showed LVEF at 35 to 40% with global hypokinesis and left ventricular regional wall motion abnormalities. Cath showed 20% prox and 60% mid LAD stenosis and med management was recommended. He was started on guideline directed medical therapy for HF and high intensity statin was started. He was seen back in office by the PA and was doing well.  cMRI done 12/2019 showed severe LV dysfunction with EF 29% and moderate RV dysfunction with RVEF 36% and no evidence of LGE or amyloidosis.    He is here today for followup .  He was supposed to have a repeat 2D echo after his last OV in Nov 2021 to reassess LVF and if remained low then refer to EP for ICD.  He never followed through with echo. He was also supposed to have a split night sleep study due to hx of OSA and CHF but never followed through.    He has recently been having problems with left shoulder pain that has been constant for weeks and worse at night.  He is here for preop cardiac clearance for shoulder arthroscopy.  He denies any chest pain or pressure, PND, orthopnea, LE edema, dizziness, palpitations or syncope. He has chronic DOE that is related to smoking and has been very stable.  He is compliant with his meds and is tolerating meds with no SE.     Past Medical History:  Diagnosis Date   Arthritis    Bladder cancer [facility]) urologist-  dr Kipp Brood   Borderline  diabetes    Bursitis of both shoulders    CAD (coronary artery disease), native coronary artery    Cath 03/2019 with 20% prox and 60% mid LAD stenosis >>medical management recommended   Chronic systolic CHF (congestive heart failure), NYHA class 2 (HCC)    EF 29% by cMRI   Diabetes mellitus without complication ([place])    Disorder of male genital organs, unspecified    Esophagus disorder    Constricted Esophagus, per New Patient Packet-PSC    History of basal cell carcinoma (BCC) excision    05/ 2018  right forehead   HLD (hyperlipidemia)    Hypertension    Mild acid reflux no meds   Nocturia    OSA (obstructive sleep apnea)    per pt has not used cpap in years   Renal cyst, left 11/2011   16 mm   Sleep apnea    Per New Patient Packet-PSC    Smokers' cough ([place])    SOB (shortness of breath) on exertion     Past Surgical History:  Procedure Laterality Date   CYSTOSCOPY WITH BIOPSY  03/13/2012   Procedure: CYSTOSCOPY WITH BIOPSY;  Surgeon: Fredricka Bonine, MD;  Location: [facility];  Service: Urology;  Laterality: N/A;      ESOPHAGEAL DILATION  05/20/1986   Esophagus Stretch,  per New Patient Packet-PSC    RIGHT/LEFT HEART CATH AND CORONARY ANGIOGRAPHY N/A 04/16/2019   Procedure: RIGHT/LEFT HEART CATH AND CORONARY ANGIOGRAPHY;  Surgeon: Troy Sine, MD;  Location: [facility];  Service: Cardiovascular;  Laterality: N/A;   TONSILLECTOMY  age 46   TRANSURETHRAL RESECTION OF BLADDER TUMOR N/A 05/23/2017   Procedure: TRANSURETHRAL RESECTION OF BLADDER TUMOR / (TURBT) WITH INSTILLATION OF EPIRUBICIN;  Surgeon: [name], MD;  Location: [facility];  Service: Urology;  Laterality: N/A;   UVULECTOMY  05/20/1990   Dr.Byers, per new patient packet    UVULOPALATOPHARYNGOPLASTY  2005   osa    Current Medications: Current Meds  Medication Sig   ACCU-CHEK GUIDE test strip USE TO TEST BLOOD SUGAR DAILY.   Accu-Chek Softclix Lancets  lancets USE TO TEST BLOOD SUGAR DAILY.   acetaminophen (TYLENOL) 500 MG tablet Take 2 tablets (1,000 mg total) by mouth every 8 (eight) hours as needed for moderate pain.   albuterol (VENTOLIN HFA) 108 (90 Base) MCG/ACT inhaler INHALE 2 PUFFS INTO THE LUNGS EVERY 6 HOURS AS NEEDED FOR WHEEZING OR SHORTNESS OF BREATH.   ANORO ELLIPTA 62.5-25 MCG/INH AEPB INHALE 1 PUFF INTO THE LUNGS ONCE DAILY.   aspirin 81 MG chewable tablet Chew 1 tablet (81 mg total) by mouth daily.   atorvastatin (LIPITOR) 80 MG tablet TAKE ONE TABLET BY MOUTH ONCE DAILY AT 6PM.   Baclofen 5 MG TABS Take 5 mg by mouth 2 (two) times daily as needed. Muscle spasm   carisoprodol (SOMA) 350 MG tablet Take 350 mg by mouth at bedtime as needed.   dapagliflozin propanediol (FARXIGA) 10 MG TABS tablet Take 1 tablet (10 mg total) by mouth daily before breakfast.   Dulaglutide (TRULICITY) 6.31 SH/7.69YO SOPN Inject 0.75 mg into the skin once a week.   furosemide (LASIX) 20 MG tablet Take 1 tablet (20 mg total) by mouth daily. Please make yearly appt with Dr. Radford Pax for November 2022 for future refills. Thank you 1st attempt   glipiZIDE (GLUCOTROL) 5 MG tablet TAKE (1) TABLET BY MOUTH TWICE DAILY.   metFORMIN (GLUCOPHAGE) 1000 MG tablet TAKE (1) TABLET BY MOUTH TWICE A DAY WITH MEALS (BREAKFAST AND SUPPER).   metoprolol succinate (TOPROL XL) 100 MG 24 hr tablet Take 1 tablet (100 mg total) by mouth daily. Take with or immediately following a meal.   metoprolol succinate (TOPROL XL) 25 MG 24 hr tablet Take 1 tablet (25 mg total) by mouth daily.   Multiple Vitamin (MULTIVITAMIN WITH MINERALS) TABS tablet Take 1 tablet by mouth daily.   potassium chloride SA (KLOR-CON) 20 MEQ tablet Take 2 tablets (40 mEq total) by mouth daily. Please make yearly appt with Dr. Radford Pax for November 2022 for future refills. Thank you 1st attempt   sacubitril-valsartan (ENTRESTO) 97-103 MG Take 1 tablet by mouth 2 (two) times daily. Please make yearly appt with Dr.  Radford Pax for November 2022 for future refills. Thank you 1st attempt   spironolactone (ALDACTONE) 25 MG tablet TAKE (1) TABLET BY MOUTH ONCE DAILY.     Allergies:   Chantix [varenicline tartrate]   Social History   Socioeconomic History   Marital status: Married    Spouse name: Not on file   Number of children: Not on file   Years of education: Not on file   Highest education level: Not on file  Occupational History   Not on file  Tobacco Use   Smoking status: Some Days    Packs/day:  0.50    Years: 47.00    Pack years: 23.50    Types: Cigarettes   Smokeless tobacco: Former    Types: Chew    Quit date: 1970  Vaping Use   Vaping Use: Former  Substance and Sexual Activity   Alcohol use: Yes    Comment: very seldom- rare   Drug use: Yes    Types: Marijuana    Comment: seldom-rare   Sexual activity: Not Currently  Other Topics Concern   Not on file  Social History Narrative   Diet: None      Caffeine: Yes      Married, if yes what year:  Yes, 1990      Do you live in a house, apartment, assisted living, condo, trailer, ect: House, 3 stories, 4 persons      Pets: 2 dogs, 2 cats       Highest level of education: Some Facilities manager profession: Clinical cytogeneticist       Exercise: Some         Living Will: Yes   DNR: Yes   POA/HPOA: Yes      Functional Status:   Do you have difficulty bathing or dressing yourself? No   Do you have difficulty preparing food or eating? No   Do you have difficulty managing your medications? No   Do you have difficulty managing your finances? No   Do you have difficulty affording your medications? No   Social Determinants of Radio broadcast assistant Strain: Not on file  Food Insecurity: Not on file  Transportation Needs: Not on file  Physical Activity: Not on file  Stress: Not on file  Social Connections: Not on file     Family History: The patient's family history includes ADD / ADHD in his daughter and son;  Arthritis in his mother; Cancer in his maternal grandmother; Dementia in his mother; Diabetes in his father, mother, and sister; Hypertension in his mother and sister; Migraines in his daughter; Multiple sclerosis in his father; Stroke in his mother.  ROS:   Please see the history of present illness.    ROS  All other systems reviewed and negative.   EKGs/Labs/Other Studies Reviewed:    The following studies were reviewed today: 2D echo, cardiac cath, Cardiac MRI  EKG:  EKG is ordered today and showed NSR at 87bpm with inferior infarct unchanged from 03/2020  Recent Labs: 02/08/2021: ALT 28; BUN 17; Creat 0.97; Hemoglobin 15.9; Platelets 304; Potassium 4.5; Sodium 138   Recent Lipid Panel    Component Value Date/Time   CHOL 102 02/08/2021 0810   CHOL 92 (L) 05/22/2020 0740   TRIG 179 (H) 02/08/2021 0810   HDL 39 (L) 02/08/2021 0810   HDL 39 (L) 05/22/2020 0740   CHOLHDL 2.6 02/08/2021 0810   VLDL 10 04/16/2019 0420   LDLCALC 37 02/08/2021 0810    Physical Exam:    VS:  BP 132/84   Ht 6' (1.829 m)   Wt 258 lb 9.6 oz (117.3 kg)   SpO2 95%   BMI 35.07 kg/m     Wt Readings from Last 3 Encounters:  02/12/21 258 lb 9.6 oz (117.3 kg)  02/07/21 260 lb (117.9 kg)  09/29/20 266 lb 6.4 oz (120.8 kg)    GEN: Well nourished, well developed in no acute distress HEENT: Normal NECK: No JVD; No carotid bruits LYMPHATICS: No lymphadenopathy CARDIAC:RRR, no murmurs, rubs, gallops RESPIRATORY:  Clear to auscultation without rales,  wheezing or rhonchi  ABDOMEN: Soft, non-tender, non-distended MUSCULOSKELETAL:  No edema; No deformity  SKIN: Warm and dry NEUROLOGIC:  Alert and oriented x 3 PSYCHIATRIC:  Normal affect   ASSESSMENT:    1. Coronary artery disease involving native coronary artery of native heart without angina pectoris   2. Chronic systolic heart failure ([place])   3. Essential hypertension   4. Pure hypercholesterolemia   5. Tobacco abuse   6. OSA (obstructive sleep  apnea)   7. Preoperative clearance    PLAN:    In order of problems listed above:  1.  ASCAD -Cath 03/2019 with 20% prox and 60% mid LAD stenosis >>medical management recommended -he has not had any anginal symptoms -continue ASA, BB and statin  2.  Mixed ischemic/nonischemic DCM/Chronic systolic CHF -2D echo 38/2505 with EF 35-40%  -Cardiac MRI with moderate to severe LV dysfunction EF 29% -LV dysfunction out of proportion to CAD -he remains NYHA class 2a today -he appears euvolemic on exam today with no SOB and weight is stable -he was supposed to have a repeat echo done last Nov 2021 to reassess LVF on max tolerated GDMT for HF but never got it done -Continue prescription drug management with Toprol XL 100mg  qam and 25mg  qhs, spiro 25mg  daily and Entresto 97-103mg  BID, Lasix 20mg  daily and Farxiga 10mg  daily>refilled  -check BMET today -will repeat echo today and if LVF remains < 35% will refer to EP for ICD  3.  HTN -BP is adequately controlled on exam today -continue Toprol XL 100mg  qam and 25mg  qpm, Entresto  and Spiro   4.  HLD -LDL goal < 70 -check FLP and ALT -continue prescription drug management with Atorvastatin 80mg  daily>refilled  5.  Tobacco abuse -he continues to smoke -encouraged him to quit>he is trying  6.  OSA -ordered split night sleep study at last OV and he never followed through -I again discussed and stressed the role OSA plays in Heart failure and I think he would benefit from PAP therapy and encouraged him to follow through with split night sleep study -will set up split night sleep study  7.  Preoperative cardiac clearance -he is at moderate to high risk from cardiac standpoint for surgery given his DCM, chronic systolic CHF, OSA. -he is able to achieve 5.62 mets based on the Duke Activity Status Index and therefore no further ischemia workup needed preop -he has a perioperative risk of 6.6% for major cardiac event in the periop  period   Medication Adjustments/Labs and Tests Ordered: Current medicines are reviewed at length with the patient today.  Concerns regarding medicines are outlined above.  Orders Placed This Encounter  Procedures   EKG 12-Lead    No orders of the defined types were placed in this encounter.   Signed, Fransico Him, MD  02/12/2021 10:03 AM    [place]

## 2021-02-12 NOTE — Patient Instructions (Signed)
Medication Instructions:   Your physician recommends that you continue on your current medications as directed. Please refer to the Current Medication list given to you today.  *If you need a refill on your cardiac medications before your next appointment, please call your pharmacy*   Lab Work:  TODAY--CMET AND LIPIDS  If you have labs (blood work) drawn today and your tests are completely normal, you will receive your results only by: [place] (if you have MyChart) OR A paper copy in the mail If you have any lab test that is abnormal or we need to change your treatment, we will call you to review the results.   Testing/Procedures:  Your physician has requested that you have an echocardiogram. Echocardiography is a painless test that uses sound waves to create images of your heart. It provides your doctor with information about the size and shape of your heart and how well your heart's chambers and valves are working. This procedure takes approximately one hour. There are no restrictions for this procedure.  Your physician has recommended that you have a sleep study. This test records several body functions during sleep, including: brain activity, eye movement, oxygen and carbon dioxide blood levels, heart rate and rhythm, breathing rate and rhythm, the flow of air through your mouth and nose, snoring, body muscle movements, and chest and belly movement. OUR SLEEP COORDINATOR NINA JONES WILL CONTACT YOU SOON TO ARRANGE THIS TEST    Follow-Up: At [facility], you and your health needs are our priority.  As part of our continuing mission to provide you with exceptional heart care, we have created designated Provider Care Teams.  These Care Teams include your primary Cardiologist (physician) and Advanced Practice Providers (APPs -  Physician Assistants and Nurse Practitioners) who all work together to provide you with the care you need, when you need it.  We recommend signing up  for the patient portal called "MyChart".  Sign up information is provided on this After Visit Summary.  MyChart is used to connect with patients for Virtual Visits (Telemedicine).  Patients are able to view lab/test results, encounter notes, upcoming appointments, etc.  Non-urgent messages can be sent to your provider as well.   To learn more about what you can do with MyChart, go to [email or web address].    Your next appointment:   6 month(s)  The format for your next appointment:   In Person  Provider:   Fransico Him, MD

## 2021-02-15 ENCOUNTER — Other Ambulatory Visit: Payer: Self-pay | Admitting: Nurse Practitioner

## 2021-02-15 ENCOUNTER — Other Ambulatory Visit: Payer: Self-pay | Admitting: Cardiology

## 2021-02-15 DIAGNOSIS — E1165 Type 2 diabetes mellitus with hyperglycemia: Secondary | ICD-10-CM

## 2021-02-16 ENCOUNTER — Telehealth: Payer: Self-pay | Admitting: Cardiology

## 2021-02-16 NOTE — Telephone Encounter (Signed)
Called pt and informed him that ov note 02/12/21 was faxed to requesting office as well we did re-fax x 2 today ov note giving clearance and clearance request. Pt thanked me for the call and the help.

## 2021-02-16 NOTE — Telephone Encounter (Signed)
I have re-faxed Dr. Theodosia Blender note.

## 2021-02-16 NOTE — Telephone Encounter (Signed)
error 

## 2021-02-16 NOTE — Telephone Encounter (Signed)
Patient called back to say that [facility] said they didn't received his clearance. Patient is calling asking that we send that over, so that he can be relieve of the pain he is feeling. Please advise

## 2021-02-22 ENCOUNTER — Telehealth: Payer: Self-pay | Admitting: *Deleted

## 2021-02-22 NOTE — Progress Notes (Signed)
Sent message, via epic in basket, requesting orders in epic from surgeon.  

## 2021-02-22 NOTE — Telephone Encounter (Signed)
-----   Message from Nuala Alpha, LPN sent at 6/71/2458 10:18 AM EDT ----- Regarding: SPLIT NIGHT SLEEP STUDY PER TUR[facility] Dr. Radford Pax wants this pt to have a split night sleep study done for known OSA. Can you please pre-cert and arrange and shoot Carly the date?  Thanks [employer]

## 2021-02-22 NOTE — Telephone Encounter (Signed)
Prior Authorization for SPLIT NIGHT sent to [facility] via web portal.   Notification or Prior Authorization is not required for the requested services  Decision ID #:[id number]

## 2021-02-24 ENCOUNTER — Other Ambulatory Visit: Payer: Self-pay | Admitting: Nurse Practitioner

## 2021-02-27 ENCOUNTER — Ambulatory Visit (HOSPITAL_COMMUNITY): Payer: Medicare Other | Attending: Cardiovascular Disease

## 2021-02-27 ENCOUNTER — Other Ambulatory Visit: Payer: Self-pay

## 2021-02-27 DIAGNOSIS — I420 Dilated cardiomyopathy: Secondary | ICD-10-CM | POA: Diagnosis not present

## 2021-02-27 DIAGNOSIS — Z01810 Encounter for preprocedural cardiovascular examination: Secondary | ICD-10-CM | POA: Diagnosis not present

## 2021-02-27 DIAGNOSIS — I2510 Atherosclerotic heart disease of native coronary artery without angina pectoris: Secondary | ICD-10-CM | POA: Diagnosis not present

## 2021-02-27 DIAGNOSIS — I10 Essential (primary) hypertension: Secondary | ICD-10-CM | POA: Insufficient documentation

## 2021-02-27 DIAGNOSIS — Z01818 Encounter for other preprocedural examination: Secondary | ICD-10-CM | POA: Insufficient documentation

## 2021-02-27 DIAGNOSIS — I5022 Chronic systolic (congestive) heart failure: Secondary | ICD-10-CM | POA: Insufficient documentation

## 2021-02-27 LAB — ECHOCARDIOGRAM COMPLETE
Area-P 1/2: 4.54 cm2
S' Lateral: 4.2 cm

## 2021-02-28 ENCOUNTER — Encounter: Payer: Self-pay | Admitting: Cardiology

## 2021-02-28 NOTE — Patient Instructions (Addendum)
DUE TO COVID-19 ONLY ONE VISITOR IS ALLOWED TO COME WITH YOU AND STAY IN THE WAITING ROOM ONLY DURING PRE OP AND PROCEDURE.   **NO VISITORS ARE ALLOWED IN THE SHORT STAY AREA OR RECOVERY ROOM!!**  IF YOU WILL BE ADMITTED INTO THE HOSPITAL YOU ARE ALLOWED ONLY TWO SUPPORT PEOPLE DURING VISITATION HOURS ONLY (10AM -8PM)   The support person(s) may change daily. The support person(s) must pass our screening, gel in and out, and wear a mask at all times, including in the patient's room. Patients must also wear a mask when staff or their support person are in the room.  No visitors under the age of 37. Any visitor under the age of 77 must be accompanied by an adult.     Your procedure is scheduled on: 03/15/21   Report to [facility] Main Entrance    Report to admitting at: 1:15 PM   Call this number if you have problems the morning of surgery [phone]   Do not eat food :After Midnight.   May have liquids until : 12:30 PM   day of surgery  CLEAR LIQUID DIET  Foods Allowed                                                                     Foods Excluded  Water, Black Coffee and tea, regular and decaf                             liquids that you cannot  Plain Jell-O in any flavor  (No red)                                           see through such as: Fruit ices (not with fruit pulp)                                     milk, soups, orange juice              Iced Popsicles (No red)                                    All solid food                                   Apple juices Sports drinks like Gatorade (No red) Lightly seasoned clear broth or consume(fat free) Sugar,   Sample Menu Breakfast                                Lunch                                     Supper Cranberry juice  Beef broth                            Chicken broth Jell-O                                     Grape juice                           Apple juice Coffee or tea                         Jell-O                                      Popsicle                                                Coffee or tea                        Coffee or tea     Oral Hygiene is also important to reduce your risk of infection.                                    Remember - BRUSH YOUR TEETH THE MORNING OF SURGERY WITH YOUR REGULAR TOOTHPASTE   Do NOT smoke after Midnight   Take these medicines the morning of surgery with A SIP OF WATER: metoprolol.  How to Manage Your Diabetes Before and After Surgery  Why is it important to control my blood sugar before and after surgery? Improving blood sugar levels before and after surgery helps healing and can limit problems. A way of improving blood sugar control is eating a healthy diet by:  Eating less sugar and carbohydrates  Increasing activity/exercise  Talking with your doctor about reaching your blood sugar goals High blood sugars (greater than 180 mg/dL) can raise your risk of infections and slow your recovery, so you will need to focus on controlling your diabetes during the weeks before surgery. Make sure that the doctor who takes care of your diabetes knows about your planned surgery including the date and location.  How do I manage my blood sugar before surgery? Check your blood sugar at least 4 times a day, starting 2 days before surgery, to make sure that the level is not too high or low. Check your blood sugar the morning of your surgery when you wake up and every 2 hours until you get to the Short Stay unit. If your blood sugar is less than 70 mg/dL, you will need to treat for low blood sugar: Do not take insulin. Treat a low blood sugar (less than 70 mg/dL) with  cup of clear juice (cranberry or apple), 4 glucose tablets, OR glucose gel. Recheck blood sugar in 15 minutes after treatment (to make sure it is greater than 70 mg/dL). If your blood sugar is not greater than 70 mg/dL on recheck, call [phone] for further  instructions. Report your blood sugar to the short stay  nurse when you get to Short Stay.  If you are admitted to the hospital after surgery: Your blood sugar will be checked by the staff and you will probably be given insulin after surgery (instead of oral diabetes medicines) to make sure you have good blood sugar levels. The goal for blood sugar control after surgery is 80-180 mg/dL.   WHAT DO I DO ABOUT MY DIABETES MEDICATION?  Do not take oral diabetes medicines (pills) the morning of surgery.  THE DAY BEFORE SURGERY, DO NOT take farxiga. Take ONLY morning dose of glipizide, DO NOT take glipizide at night. Take metforming as usual.     THE MORNING OF SURGERY, DO NOT TAKE ANY ORAL DIABETES MEDICINE.  The day of surgery, do not take other diabetes injectables, including Byetta (exenatide), Bydureon (exenatide ER), Victoza (liraglutide), or Trulicity (dulaglutide).                              You may not have any metal on your body including hair pins, jewelry, and body piercing             Do not wear lotions, powders, perfumes/cologne, or deodorant               Men may shave face and neck.   Do not bring valuables to the hospital. [place].   Contacts, dentures or bridgework may not be worn into surgery.   Bring small overnight bag day of surgery.    Patients discharged on the day of surgery will not be allowed to drive home.   Special Instructions: Bring a copy of your healthcare power of attorney and living will documents         the day of surgery if you haven't scanned them before.              Please read over the following fact sheets you were given: IF YOU HAVE QUESTIONS ABOUT YOUR PRE-OP INSTRUCTIONS PLEASE CALL [phone]   [facility] - Preparing for Surgery Before surgery, you can play an important role.  Because skin is not sterile, your skin needs to be as free of germs as possible.  You can reduce the number  of germs on your skin by washing with CHG (chlorahexidine gluconate) soap before surgery.  CHG is an antiseptic cleaner which kills germs and bonds with the skin to continue killing germs even after washing. Please DO NOT use if you have an allergy to CHG or antibacterial soaps.  If your skin becomes reddened/irritated stop using the CHG and inform your nurse when you arrive at Short Stay. Do not shave (including legs and underarms) for at least 48 hours prior to the first CHG shower.  You may shave your face/neck. Please follow these instructions carefully:  1.  Shower with CHG Soap the night before surgery and the  morning of Surgery.  2.  If you choose to wash your hair, wash your hair first as usual with your  normal  shampoo.  3.  After you shampoo, rinse your hair and body thoroughly to remove the  shampoo.                           4.  Use CHG as you would any other liquid soap.  You can  apply chg directly  to the skin and wash                       Gently with a scrungie or clean washcloth.  5.  Apply the CHG Soap to your body ONLY FROM THE NECK DOWN.   Do not use on face/ open                           Wound or open sores. Avoid contact with eyes, ears mouth and genitals (private parts).                       Wash face,  Genitals (private parts) with your normal soap.             6.  Wash thoroughly, paying special attention to the area where your surgery  will be performed.  7.  Thoroughly rinse your body with warm water from the neck down.  8.  DO NOT shower/wash with your normal soap after using and rinsing off  the CHG Soap.                9.  Pat yourself dry with a clean towel.            10.  Wear clean pajamas.            11.  Place clean sheets on your bed the night of your first shower and do not  sleep with pets. Day of Surgery : Do not apply any lotions/deodorants the morning of surgery.  Please wear clean clothes to the hospital/surgery center.  FAILURE TO FOLLOW THESE  INSTRUCTIONS MAY RESULT IN THE CANCELLATION OF YOUR SURGERY PATIENT SIGNATURE_________________________________  NURSE SIGNATURE__________________________________  ________________________________________________________________________

## 2021-03-01 ENCOUNTER — Encounter (HOSPITAL_COMMUNITY)
Admission: RE | Admit: 2021-03-01 | Discharge: 2021-03-01 | Disposition: A | Discharge status: Home / Self Care | Payer: Medicare Other | Source: Ambulatory Visit | Attending: Orthopedic Surgery | Admitting: Orthopedic Surgery

## 2021-03-01 ENCOUNTER — Encounter (HOSPITAL_COMMUNITY): Payer: Self-pay

## 2021-03-01 ENCOUNTER — Other Ambulatory Visit: Payer: Self-pay

## 2021-03-01 DIAGNOSIS — Z01812 Encounter for preprocedural laboratory examination: Secondary | ICD-10-CM | POA: Diagnosis not present

## 2021-03-01 HISTORY — DX: Dyspnea, unspecified: R06.00

## 2021-03-01 LAB — BASIC METABOLIC PANEL
Anion gap: 10 (ref 5–15)
BUN: 20 mg/dL (ref 8–23)
CO2: 26 mmol/L (ref 22–32)
Calcium: 10.4 mg/dL — ABNORMAL HIGH (ref 8.9–10.3)
Chloride: 103 mmol/L (ref 98–111)
Creatinine, Ser: 0.87 mg/dL (ref 0.61–1.24)
GFR, Estimated: >60 mL/min (ref >60)
Glucose, Bld: 149 mg/dL — ABNORMAL HIGH (ref 70–99)
Potassium: 4.2 mmol/L (ref 3.5–5.1)
Sodium: 139 mmol/L (ref 135–145)

## 2021-03-01 LAB — CBC
HCT: 48.5 % (ref 39.0–52.0)
Hemoglobin: 16.4 g/dL (ref 13.0–17.0)
MCH: 30.8 pg (ref 26.0–34.0)
MCHC: 33.8 g/dL (ref 30.0–36.0)
MCV: 91.2 fL (ref 80.0–100.0)
Platelets: 272 10*3/uL (ref 150–400)
RBC: 5.32 MIL/uL (ref 4.22–5.81)
RDW: 12.7 % (ref 11.5–15.5)
WBC: 10.5 10*3/uL (ref 4.0–10.5)
nRBC: 0 % (ref 0.0–0.2)

## 2021-03-01 LAB — GLUCOSE, CAPILLARY: Glucose-Capillary: 158 mg/dL — ABNORMAL HIGH (ref 70–99)

## 2021-03-01 NOTE — Progress Notes (Signed)
COVID Vaccine Completed: Yes Date COVID Vaccine completed: 01/30/20 COVID vaccine manufacturer: Pfizer x 2    COVID Test: N/A  PCP - Sherrie Mustache NP  Cardiologist - Dr. Fransico Him.LOV: 02/12/21: Clearance.  Chest x-ray -  EKG - 02/12/21 : Epic   Stress Test -  ECHO - 02/27/21 Cardiac Cath -  Pacemaker/ICD device last checked: A!C: 8.3: 02/08/21 Sleep Study - Yes CPAP - NO  Fasting Blood Sugar - 100's Checks Blood Sugar __1___ times a day  Blood Thinner Instructions: Aspirin Instructions: Last Dose:  Anesthesia review: Hx: DIA,OSA(No CPAP),HTN,CAD,smoker  Patient denies shortness of breath, fever, cough and chest pain at PAT appointment   Patient verbalized understanding of instructions that were given to them at the PAT appointment. Patient was also instructed that they will need to review over the PAT instructions again at home before surgery.

## 2021-03-06 NOTE — Progress Notes (Signed)
Anesthesia Chart Review   Case: [id number] Date/Time: 03/15/21 1515   Procedure: Left shoulder arthroscopy, debridement, subacromial decompression, distal clavicle resection, possible rotator cuff repair (Left)   Anesthesia type: General   Pre-op diagnosis: Left shoulder impingement, acromioclavicular osteoarthritis, partial rotator cuff tear   Location: [facility] ROOM 06 / [facility]   Surgeons: Timothy Britain, MD       DISCUSSION:70 y.o. every day smoker with h/o HTN, OSA, DM II, CAD, CHF (EF 50-55% on Echo 02/27/21), bladder cancer, left shoulder impingement scheduled for above procedure 03/15/2021 with Dr. Justice Lee.   Pt last seen by cardiology 02/12/2021. Per OV note, "-he is at moderate to high risk from cardiac standpoint for surgery given his DCM, chronic systolic CHF, OSA. -he is able to achieve 5.62 mets based on the Duke Activity Status Index and therefore no further ischemia workup needed preop -he has a perioperative risk of 6.6% for major cardiac event in the periop period"  Anticipate pt can proceed with planned procedure barring acute status change.   VS: BP 130/86   Pulse 99   Temp 36.7 C (Oral)   Ht 6' (1.829 m)   Wt 114.8 kg   SpO2 96%   BMI 34.31 kg/m   PROVIDERS: Timothy Chandler, NP is PCP   Timothy Him, MD is Cardiologist  LABS: Labs reviewed: Acceptable for surgery. (all labs ordered are listed, but only abnormal results are displayed)  Labs Reviewed  BASIC METABOLIC PANEL - Abnormal; Notable for the following components:      Result Value   Glucose, Bld 149 (*)    Calcium 10.4 (*)    All other components within normal limits  GLUCOSE, CAPILLARY - Abnormal; Notable for the following components:   Glucose-Capillary 158 (*)    All other components within normal limits  CBC     IMAGES:   EKG: 02/12/2021 Rate 87 bpm  NSR  CV: Echo 02/27/21   1. Left ventricular ejection fraction, by estimation, is 50 to 55%. The  left ventricle has low normal  function. The left ventricle has no regional  wall motion abnormalities. Left ventricular diastolic parameters were  normal.   2. Right ventricular systolic function is normal. The right ventricular  size is normal. Tricuspid regurgitation signal is inadequate for assessing  PA pressure.   3. The mitral valve is grossly normal. Trivial mitral valve  regurgitation. No evidence of mitral stenosis.   4. The aortic valve is tricuspid. There is mild calcification of the  aortic valve. Aortic valve regurgitation is not visualized. No aortic  stenosis is present.   5. The inferior vena cava is normal in size with greater than 50%  respiratory variability, suggesting right atrial pressure of 3 mmHg. Past Medical History:  Diagnosis Date   Arthritis    Bladder cancer [facility]) urologist-  dr Timothy Lee   Borderline diabetes    Bursitis of both shoulders    CAD (coronary artery disease), native coronary artery    Cath 03/2019 with 20% prox and 60% mid LAD stenosis >>medical management recommended   Chronic systolic CHF (congestive heart failure), NYHA class 2 (HCC)    EF 29% by cMRI>>improved to 50-55% by echo 02/2021   Diabetes mellitus without complication (Timothy Lee)    Disorder of male genital organs, unspecified    Dyspnea    Esophagus disorder    Constricted Esophagus, per New Patient Packet-PSC    History of basal cell carcinoma (BCC) excision    05/ 2018  right forehead   HLD (hyperlipidemia)    Hypertension    Mild acid reflux no meds   Nocturia    OSA (obstructive sleep apnea)    per pt has not used cpap in years   Renal cyst, left 11/2011   16 mm   Sleep apnea    Per New Patient Packet-PSC    Smokers' cough ([place])    SOB (shortness of breath) on exertion     Past Surgical History:  Procedure Laterality Date   CYSTOSCOPY WITH BIOPSY  03/13/2012   Procedure: CYSTOSCOPY WITH BIOPSY;  Surgeon: Timothy Bonine, MD;  Location: [facility];  Service: Urology;   Laterality: N/A;      ESOPHAGEAL DILATION  05/20/1986   Esophagus Stretch, per New Patient Packet-PSC    RIGHT/LEFT HEART CATH AND CORONARY ANGIOGRAPHY N/A 04/16/2019   Procedure: RIGHT/LEFT HEART CATH AND CORONARY ANGIOGRAPHY;  Surgeon: Timothy Sine, MD;  Location: [place] [facility];  Service: Cardiovascular;  Laterality: N/A;   TONSILLECTOMY  age 64   TRANSURETHRAL RESECTION OF BLADDER TUMOR N/A 05/23/2017   Procedure: TRANSURETHRAL RESECTION OF BLADDER TUMOR / (TURBT) WITH INSTILLATION OF EPIRUBICIN;  Surgeon: Timothy Aloe, MD;  Location: [place];  Service: Urology;  Laterality: N/A;   UVULECTOMY  05/20/1990   Timothy Lee, per new patient packet    UVULOPALATOPHARYNGOPLASTY  2005   osa    MEDICATIONS:  ACCU-CHEK GUIDE test strip   Accu-Chek Softclix Lancets lancets   acetaminophen (TYLENOL) 500 MG tablet   albuterol (VENTOLIN HFA) 108 (90 Base) MCG/ACT inhaler   ANORO ELLIPTA 62.5-25 MCG/INH AEPB   aspirin 81 MG chewable tablet   atorvastatin (LIPITOR) 80 MG tablet   carisoprodol (SOMA) 350 MG tablet   dapagliflozin propanediol (FARXIGA) 10 MG TABS tablet   Dulaglutide (TRULICITY) 5.83 EN/4.0HW SOPN   furosemide (LASIX) 20 MG tablet   glipiZIDE (GLUCOTROL) 5 MG tablet   Glucosamine-Chondroitin-MSM (GLUCOS-CHOND-MSM-DOUBLE STR PO)   levocetirizine (XYZAL) 5 MG tablet   metFORMIN (GLUCOPHAGE) 1000 MG tablet   metoprolol succinate (TOPROL XL) 100 MG 24 hr tablet   metoprolol succinate (TOPROL XL) 25 MG 24 hr tablet   Multiple Vitamin (MULTIVITAMIN WITH MINERALS) TABS tablet   potassium chloride SA (KLOR-CON) 20 MEQ tablet   sacubitril-valsartan (ENTRESTO) 97-103 MG   spironolactone (ALDACTONE) 25 MG tablet   No current facility-administered medications for this encounter.    Timothy Felix Ward, PA-C [facility] [phone]

## 2021-03-06 NOTE — Anesthesia Preprocedure Evaluation (Addendum)
Anesthesia Evaluation  Patient identified by MRN, date of birth, ID band Patient awake    Reviewed: Allergy & Precautions, NPO status , Patient's Chart, lab work & pertinent test results  Airway Mallampati: II  TM Distance: >3 FB Neck ROM: Full    Dental  (+) Teeth Intact, Dental Advisory Given   Pulmonary sleep apnea (non-compliant w/ CPAP) , COPD, Current Smoker and Patient abstained from smoking.,  Current every day smoker, 24 pack year history- currently a few cigarettes a day but previously much heavier Had palatoplasty but OSA likely unresolved, is supposed to have a sleep study again  Uses inhalers daily   Pulmonary exam normal breath sounds clear to auscultation       Cardiovascular hypertension (140/79 in preop), Pt. on medications and Pt. on home beta blockers + CAD (Cath 03/2019 with 20% prox and 60% mid LAD stenosis >>medical management recommended) and +CHF (reduced EF in past, has normalized recently)  Normal cardiovascular exam Rhythm:Regular Rate:Normal  Echo 1. Left ventricular ejection fraction, by estimation, is 50 to 55%. The  left ventricle has low normal function. The left ventricle has no regional  wall motion abnormalities. Left ventricular diastolic parameters were  normal.  2. Right ventricular systolic function is normal. The right ventricular  size is normal. Tricuspid regurgitation signal is inadequate for assessing  PA pressure.  3. The mitral valve is grossly normal. Trivial mitral valve  regurgitation. No evidence of mitral stenosis.  4. The aortic valve is tricuspid. There is mild calcification of the  aortic valve. Aortic valve regurgitation is not visualized. No aortic  stenosis is present.  5. The inferior vena cava is normal in size with greater than 50%  respiratory variability, suggesting right atrial pressure of 3 mmHg.    Per cards: "-he is at moderate to high risk from cardiac  standpoint for surgery given his DCM, chronic systolic CHF, OSA. -he is able to achieve 5.62 mets based on the Duke Activity Status Index and therefore no further ischemia workup needed preop -he has a perioperative risk of 6.6% for major cardiac event in the periop period"   Neuro/Psych negative neurological ROS  negative psych ROS   GI/Hepatic Neg liver ROS, GERD  Controlled,  Endo/Other  diabetes, Well Controlled, Type 2, Oral Hypoglycemic AgentsObesity BMI 34 a1c 8.3  Renal/GU negative Renal ROS  negative genitourinary   Musculoskeletal  (+) Arthritis , Osteoarthritis,  L shoulder impingement, OA, partial rotator cuff tear    Abdominal (+) + obese,   Peds negative pediatric ROS (+)  Hematology negative hematology ROS (+)   Anesthesia Other Findings   Reproductive/Obstetrics negative OB ROS                          Anesthesia Physical Anesthesia Plan  ASA: 3  Anesthesia Plan: General and Regional   Post-op Pain Management: GA combined w/ Regional for post-op pain   Induction: Intravenous  PONV Risk Score and Plan: 1 and Ondansetron, Dexamethasone, Midazolam and Treatment may vary due to age or medical condition  Airway Management Planned: Oral ETT  Additional Equipment: None  Intra-op Plan:   Post-operative Plan: Extubation in OR  Informed Consent: I have reviewed the patients History and Physical, chart, labs and discussed the procedure including the risks, benefits and alternatives for the proposed anesthesia with the patient or authorized representative who has indicated his/her understanding and acceptance.     Dental advisory given  Plan Discussed with:  CRNA  Anesthesia Plan Comments:       Anesthesia Quick Evaluation

## 2021-03-15 ENCOUNTER — Ambulatory Visit (HOSPITAL_COMMUNITY): Payer: Medicare Other | Admitting: Anesthesiology

## 2021-03-15 ENCOUNTER — Ambulatory Visit (HOSPITAL_COMMUNITY)
Admission: RE | Admit: 2021-03-15 | Discharge: 2021-03-15 | Disposition: A | Discharge status: Home / Self Care | Payer: Medicare Other | Source: Ambulatory Visit | Attending: Orthopedic Surgery | Admitting: Orthopedic Surgery

## 2021-03-15 ENCOUNTER — Encounter (HOSPITAL_COMMUNITY)
Admission: RE | Disposition: A | Discharge status: Home / Self Care | Payer: Self-pay | Source: Ambulatory Visit | Attending: Orthopedic Surgery

## 2021-03-15 ENCOUNTER — Ambulatory Visit (HOSPITAL_COMMUNITY): Payer: Medicare Other | Admitting: Physician Assistant

## 2021-03-15 ENCOUNTER — Encounter (HOSPITAL_COMMUNITY): Payer: Self-pay | Admitting: Orthopedic Surgery

## 2021-03-15 DIAGNOSIS — M75122 Complete rotator cuff tear or rupture of left shoulder, not specified as traumatic: Secondary | ICD-10-CM | POA: Insufficient documentation

## 2021-03-15 DIAGNOSIS — Z79899 Other long term (current) drug therapy: Secondary | ICD-10-CM | POA: Insufficient documentation

## 2021-03-15 DIAGNOSIS — Z7982 Long term (current) use of aspirin: Secondary | ICD-10-CM | POA: Diagnosis not present

## 2021-03-15 DIAGNOSIS — M25812 Other specified joint disorders, left shoulder: Secondary | ICD-10-CM | POA: Diagnosis present

## 2021-03-15 DIAGNOSIS — Z7985 Long-term (current) use of injectable non-insulin antidiabetic drugs: Secondary | ICD-10-CM | POA: Diagnosis not present

## 2021-03-15 DIAGNOSIS — G8918 Other acute postprocedural pain: Secondary | ICD-10-CM | POA: Diagnosis not present

## 2021-03-15 DIAGNOSIS — M7542 Impingement syndrome of left shoulder: Secondary | ICD-10-CM | POA: Insufficient documentation

## 2021-03-15 DIAGNOSIS — E119 Type 2 diabetes mellitus without complications: Secondary | ICD-10-CM | POA: Insufficient documentation

## 2021-03-15 DIAGNOSIS — S43432A Superior glenoid labrum lesion of left shoulder, initial encounter: Secondary | ICD-10-CM | POA: Diagnosis not present

## 2021-03-15 DIAGNOSIS — Z7984 Long term (current) use of oral hypoglycemic drugs: Secondary | ICD-10-CM | POA: Diagnosis not present

## 2021-03-15 DIAGNOSIS — G8929 Other chronic pain: Secondary | ICD-10-CM | POA: Insufficient documentation

## 2021-03-15 DIAGNOSIS — M65812 Other synovitis and tenosynovitis, left shoulder: Secondary | ICD-10-CM | POA: Diagnosis not present

## 2021-03-15 DIAGNOSIS — M19012 Primary osteoarthritis, left shoulder: Secondary | ICD-10-CM | POA: Insufficient documentation

## 2021-03-15 HISTORY — PX: SHOULDER ARTHROSCOPY WITH ROTATOR CUFF REPAIR: SHX5685

## 2021-03-15 LAB — GLUCOSE, CAPILLARY
Glucose-Capillary: 126 mg/dL — ABNORMAL HIGH (ref 70–99)
Glucose-Capillary: 100 mg/dL — ABNORMAL HIGH (ref 70–99)

## 2021-03-15 SURGERY — ARTHROSCOPY, SHOULDER, WITH ROTATOR CUFF REPAIR
Anesthesia: Regional | Laterality: Left

## 2021-03-15 MED ORDER — FENTANYL CITRATE PF 50 MCG/ML IJ SOSY
50.0000 ug | PREFILLED_SYRINGE | Freq: Once | INTRAMUSCULAR | Status: AC
  Administered 2021-03-15: 50 ug via INTRAVENOUS
  Filled 2021-03-15: qty 2

## 2021-03-15 MED ORDER — ALBUTEROL SULFATE HFA 108 (90 BASE) MCG/ACT IN AERS
INHALATION_SPRAY | RESPIRATORY_TRACT | Status: DC | PRN
  Administered 2021-03-15: 6 via RESPIRATORY_TRACT

## 2021-03-15 MED ORDER — MIDAZOLAM HCL 2 MG/2ML IJ SOLN
1.0000 mg | Freq: Once | INTRAMUSCULAR | Status: AC
  Administered 2021-03-15: 2 mg via INTRAVENOUS
  Filled 2021-03-15: qty 2

## 2021-03-15 MED ORDER — CARISOPRODOL 350 MG PO TABS: 350.0000 mg | ORAL_TABLET | Freq: Every evening | ORAL | 0 refills | Status: DC | PRN

## 2021-03-15 MED ORDER — BUPIVACAINE LIPOSOME 1.3 % IJ SUSP
INTRAMUSCULAR | Status: DC | PRN
  Administered 2021-03-15: 10 mL via PERINEURAL

## 2021-03-15 MED ORDER — PROPOFOL 10 MG/ML IV BOLUS
INTRAVENOUS | Status: AC
  Filled 2021-03-15: qty 20

## 2021-03-15 MED ORDER — PHENYLEPHRINE HCL-NACL 20-0.9 MG/250ML-% IV SOLN
INTRAVENOUS | Status: DC | PRN
  Administered 2021-03-15: 50 ug/min via INTRAVENOUS
  Administered 2021-03-15: 25 ug/min via INTRAVENOUS

## 2021-03-15 MED ORDER — ONDANSETRON HCL 4 MG/2ML IJ SOLN
INTRAMUSCULAR | Status: DC | PRN
Start: 2021-03-15 — End: 2021-03-15
  Administered 2021-03-15: 4 mg via INTRAVENOUS

## 2021-03-15 MED ORDER — PROPOFOL 500 MG/50ML IV EMUL
INTRAVENOUS | Status: DC | PRN
Start: 2021-03-15 — End: 2021-03-15
  Administered 2021-03-15: 50 ug/kg/min via INTRAVENOUS

## 2021-03-15 MED ORDER — OXYCODONE HCL 5 MG PO TABS: 5.0000 mg | ORAL_TABLET | Freq: Once | ORAL | Status: DC | PRN

## 2021-03-15 MED ORDER — LIDOCAINE 2% (20 MG/ML) 5 ML SYRINGE
INTRAMUSCULAR | Status: DC | PRN
  Administered 2021-03-15: 40 mg via INTRAVENOUS

## 2021-03-15 MED ORDER — LACTATED RINGERS IV SOLN: INTRAVENOUS | Status: DC

## 2021-03-15 MED ORDER — SODIUM CHLORIDE 0.9 % IR SOLN
Status: DC | PRN
  Administered 2021-03-15 (×3): 3000 mL
  Administered 2021-03-15: 6000 mL
  Administered 2021-03-15: 3000 mL

## 2021-03-15 MED ORDER — ONDANSETRON HCL 4 MG/2ML IJ SOLN
INTRAMUSCULAR | Status: AC
  Filled 2021-03-15: qty 2

## 2021-03-15 MED ORDER — PROPOFOL 10 MG/ML IV BOLUS
INTRAVENOUS | Status: DC | PRN
  Administered 2021-03-15: 200 mg via INTRAVENOUS
  Administered 2021-03-15: 30 mg via INTRAVENOUS

## 2021-03-15 MED ORDER — ACETAMINOPHEN 500 MG PO TABS
1000.0000 mg | ORAL_TABLET | Freq: Once | ORAL | Status: AC
  Administered 2021-03-15: 1000 mg via ORAL
  Filled 2021-03-15: qty 2

## 2021-03-15 MED ORDER — FENTANYL CITRATE (PF) 100 MCG/2ML IJ SOLN
INTRAMUSCULAR | Status: DC | PRN
  Administered 2021-03-15: 50 ug via INTRAVENOUS
  Administered 2021-03-15: 100 ug via INTRAVENOUS

## 2021-03-15 MED ORDER — HYDROMORPHONE HCL 1 MG/ML IJ SOLN: 0.2500 mg | INTRAMUSCULAR | Status: DC | PRN

## 2021-03-15 MED ORDER — ROCURONIUM BROMIDE 100 MG/10ML IV SOLN
INTRAVENOUS | Status: DC | PRN
  Administered 2021-03-15: 100 mg via INTRAVENOUS

## 2021-03-15 MED ORDER — MIDAZOLAM HCL 5 MG/5ML IJ SOLN
INTRAMUSCULAR | Status: DC | PRN
  Administered 2021-03-15: 1 mg via INTRAVENOUS

## 2021-03-15 MED ORDER — FENTANYL CITRATE (PF) 250 MCG/5ML IJ SOLN
INTRAMUSCULAR | Status: AC
  Filled 2021-03-15: qty 5

## 2021-03-15 MED ORDER — SUGAMMADEX SODIUM 500 MG/5ML IV SOLN
INTRAVENOUS | Status: DC | PRN
  Administered 2021-03-15: 300 mg via INTRAVENOUS

## 2021-03-15 MED ORDER — MIDAZOLAM HCL 2 MG/2ML IJ SOLN
INTRAMUSCULAR | Status: AC
  Filled 2021-03-15: qty 2

## 2021-03-15 MED ORDER — BUPIVACAINE HCL (PF) 0.5 % IJ SOLN
INTRAMUSCULAR | Status: DC | PRN
  Administered 2021-03-15: 15 mL via PERINEURAL

## 2021-03-15 MED ORDER — ORAL CARE MOUTH RINSE: 15.0000 mL | Freq: Once | OROMUCOSAL | Status: AC

## 2021-03-15 MED ORDER — SUGAMMADEX SODIUM 500 MG/5ML IV SOLN
INTRAVENOUS | Status: AC
  Filled 2021-03-15: qty 5

## 2021-03-15 MED ORDER — CEFAZOLIN SODIUM-DEXTROSE 2-4 GM/100ML-% IV SOLN
2.0000 g | INTRAVENOUS | Status: AC
  Administered 2021-03-15: 2 g via INTRAVENOUS
  Filled 2021-03-15: qty 100

## 2021-03-15 MED ORDER — ONDANSETRON HCL 4 MG PO TABS: 4.0000 mg | ORAL_TABLET | Freq: Three times a day (TID) | ORAL | 0 refills | Status: DC | PRN

## 2021-03-15 MED ORDER — DEXAMETHASONE SODIUM PHOSPHATE 10 MG/ML IJ SOLN
INTRAMUSCULAR | Status: AC
  Filled 2021-03-15: qty 1

## 2021-03-15 MED ORDER — OXYCODONE HCL 5 MG/5ML PO SOLN: 5.0000 mg | Freq: Once | ORAL | Status: DC | PRN

## 2021-03-15 MED ORDER — PROPOFOL 500 MG/50ML IV EMUL
INTRAVENOUS | Status: AC
  Filled 2021-03-15: qty 50

## 2021-03-15 MED ORDER — DEXAMETHASONE SODIUM PHOSPHATE 10 MG/ML IJ SOLN
INTRAMUSCULAR | Status: DC | PRN
  Administered 2021-03-15: 10 mg via INTRAVENOUS

## 2021-03-15 MED ORDER — ONDANSETRON HCL 4 MG/2ML IJ SOLN: 4.0000 mg | Freq: Once | INTRAMUSCULAR | Status: DC | PRN

## 2021-03-15 MED ORDER — CHLORHEXIDINE GLUCONATE 0.12 % MT SOLN
15.0000 mL | Freq: Once | OROMUCOSAL | Status: AC
  Administered 2021-03-15: 15 mL via OROMUCOSAL

## 2021-03-15 MED ORDER — ALBUTEROL SULFATE HFA 108 (90 BASE) MCG/ACT IN AERS
INHALATION_SPRAY | RESPIRATORY_TRACT | Status: AC
  Filled 2021-03-15: qty 13.4

## 2021-03-15 MED ORDER — OXYCODONE-ACETAMINOPHEN 5-325 MG PO TABS: 1.0000 | ORAL_TABLET | ORAL | 0 refills | Status: DC | PRN

## 2021-03-15 MED ORDER — PHENYLEPHRINE HCL (PRESSORS) 10 MG/ML IV SOLN
INTRAVENOUS | Status: AC
  Filled 2021-03-15: qty 2

## 2021-03-15 SURGICAL SUPPLY — 58 items
ANCH SUT SWLK 19.1X4.75 VT (Anchor) ×4 IMPLANT
ANCHOR PEEK 4.75X19.1 SWLK C (Anchor) ×4 IMPLANT
BAG COUNTER SPONGE SURGICOUNT (BAG) ×2 IMPLANT
BAG SPNG CNTER NS LX DISP (BAG) ×1
BLADE EXCALIBUR 4.0X13 (MISCELLANEOUS) ×2 IMPLANT
BOOTIES KNEE HIGH SLOAN (MISCELLANEOUS) ×4 IMPLANT
BURR OVAL 8 FLU 4.0X13 (MISCELLANEOUS) ×3 IMPLANT
BURR OVAL 8 FLU 5.0X13 (MISCELLANEOUS) ×2 IMPLANT
CANNULA ACUFLEX KIT 5X76 (CANNULA) ×2 IMPLANT
CANNULA DRILOCK 5.0X75 (CANNULA) IMPLANT
CANNULA TWIST IN 8.25X7CM (CANNULA) ×1 IMPLANT
CONNECTOR 5 IN 1 STRAIGHT STRL (MISCELLANEOUS) ×2 IMPLANT
COOLER ICEMAN CLASSIC (MISCELLANEOUS) IMPLANT
DISSECTOR  3.8MM X 13CM (MISCELLANEOUS) ×2
DISSECTOR 3.8MM X 13CM (MISCELLANEOUS) ×1 IMPLANT
DRAPE INCISE 23X17 IOBAN STRL (DRAPES) ×1
DRAPE INCISE 23X17 STRL (DRAPES) ×1 IMPLANT
DRAPE INCISE IOBAN 23X17 STRL (DRAPES) ×1 IMPLANT
DRAPE INCISE IOBAN 66X45 STRL (DRAPES) ×2 IMPLANT
DRAPE ORTHO SPLIT 77X108 STRL (DRAPES)
DRAPE STERI 35X30 U-POUCH (DRAPES) ×2 IMPLANT
DRAPE SURG 17X11 SM STRL (DRAPES) ×2 IMPLANT
DRAPE SURG ORHT 6 SPLT 77X108 (DRAPES) IMPLANT
DRAPE U-SHAPE 47X51 STRL (DRAPES) ×2 IMPLANT
DRSG PAD ABDOMINAL 8X10 ST (GAUZE/BANDAGES/DRESSINGS) ×4 IMPLANT
DURAPREP 26ML APPLICATOR (WOUND CARE) ×2 IMPLANT
FIBER TAPE 2MM (SUTURE) ×1 IMPLANT
GAUZE SPONGE 4X4 12PLY STRL (GAUZE/BANDAGES/DRESSINGS) ×2 IMPLANT
GLOVE SURG ENC MOIS LTX SZ7.5 (GLOVE) ×2 IMPLANT
GLOVE SURG ENC MOIS LTX SZ8 (GLOVE) ×2 IMPLANT
GLOVE SURG MICRO LTX SZ7 (GLOVE) ×4 IMPLANT
GLOVE SURG MICRO LTX SZ7.5 (GLOVE) ×2 IMPLANT
GOWN STRL REUS W/TWL LRG LVL3 (GOWN DISPOSABLE) ×4 IMPLANT
KIT BASIN OR (CUSTOM PROCEDURE TRAY) ×2 IMPLANT
KIT SHOULDER TRACTION (DRAPES) ×2 IMPLANT
KIT TURNOVER KIT A (KITS) IMPLANT
MANIFOLD NEPTUNE II (INSTRUMENTS) ×2 IMPLANT
NDL SCORPION MULTI FIRE (NEEDLE) IMPLANT
NEEDLE SCORPION MULTI FIRE (NEEDLE) ×2 IMPLANT
NS IRRIG 1000ML POUR BTL (IV SOLUTION) ×2 IMPLANT
PACK ARTHROSCOPY WL (CUSTOM PROCEDURE TRAY) ×2 IMPLANT
PAD ARMBOARD 7.5X6 YLW CONV (MISCELLANEOUS) ×2 IMPLANT
PAD COLD SHLDR WRAP-ON (PAD) IMPLANT
PROBE APOLLO 90XL (SURGICAL WAND) ×2 IMPLANT
SLING ARM FOAM STRAP MED (SOFTGOODS) IMPLANT
SPONGE T-LAP 4X18 ~~LOC~~+RFID (SPONGE) ×2 IMPLANT
STRIP CLOSURE SKIN 1/2X4 (GAUZE/BANDAGES/DRESSINGS) ×2 IMPLANT
SUT FIBERWIRE #2 38 T-5 BLUE (SUTURE)
SUT MNCRL AB 3-0 PS2 18 (SUTURE) ×2 IMPLANT
SUT PDS AB 0 CT 36 (SUTURE) IMPLANT
SUT TIGER TAPE 7 IN WHITE (SUTURE) ×2 IMPLANT
SUTURE FIBERWR #2 38 T-5 BLUE (SUTURE) IMPLANT
TAPE FIBER 2MM 7IN #2 BLUE (SUTURE) ×1 IMPLANT
TAPE PAPER 3X10 WHT MICROPORE (GAUZE/BANDAGES/DRESSINGS) ×2 IMPLANT
TOWEL OR 17X26 10 PK STRL BLUE (TOWEL DISPOSABLE) ×2 IMPLANT
TUBING ARTHROSCOPY IRRIG 16FT (MISCELLANEOUS) ×2 IMPLANT
WATER STERILE IRR 1000ML POUR (IV SOLUTION) ×2 IMPLANT
YANKAUER SUCT BULB TIP 10FT TU (MISCELLANEOUS) ×2 IMPLANT

## 2021-03-15 NOTE — Anesthesia Postprocedure Evaluation (Signed)
Anesthesia Post Note  Patient: Timothy Lee  Procedure(s) Performed: Left shoulder arthroscopy, debridement, subacromial decompression, distal clavicle resection, possible rotator cuff repair (Left)     Patient location during evaluation: PACU Anesthesia Type: Regional and General Level of consciousness: awake and alert Pain management: pain level controlled Vital Signs Assessment: post-procedure vital signs reviewed and stable Respiratory status: spontaneous breathing, nonlabored ventilation, respiratory function stable and patient connected to nasal cannula oxygen Cardiovascular status: blood pressure returned to baseline and stable Postop Assessment: no apparent nausea or vomiting Anesthetic complications: no   No notable events documented.  Last Vitals:  Vitals:   03/15/21 1830 03/15/21 1845  BP: 97/65   Pulse: 80 78  Resp: 19   Temp:    SpO2: 92% 92%    Last Pain:  Vitals:   03/15/21 1845  TempSrc:   PainSc: 0-No pain                 Belenda Cruise P Tenecia Ignasiak

## 2021-03-15 NOTE — Progress Notes (Signed)
Assisted Dr Beth Finucane with left, ultrasound guided, interscalene  block. Side rails up, monitors on throughout procedure. See vital signs in flow sheet. Tolerated Procedure well.  

## 2021-03-15 NOTE — Transfer of Care (Signed)
Immediate Anesthesia Transfer of Care Note  Patient: Timothy Lee  Procedure(s) Performed: Left shoulder arthroscopy, debridement, subacromial decompression, distal clavicle resection, possible rotator cuff repair (Left)  Patient Location: PACU  Anesthesia Type:General  Level of Consciousness: awake, alert , oriented and patient cooperative  Airway & Oxygen Therapy: Patient Spontanous Breathing and Patient connected to face mask oxygen  Post-op Assessment: Report given to RN, Post -op Vital signs reviewed and stable and Patient moving all extremities X 4  Post vital signs: stable  Last Vitals:  Vitals Value Taken Time  BP 105/67 03/15/21 1810  Temp    Pulse 81 03/15/21 1814  Resp 14 03/15/21 1814  SpO2 93 % 03/15/21 1814  Vitals shown include unvalidated device data.  Last Pain:  Vitals:   03/15/21 1354  TempSrc: Oral  PainSc: 0-No pain         Complications: No notable events documented.

## 2021-03-15 NOTE — Anesthesia Procedure Notes (Signed)
Anesthesia Regional Block: Interscalene brachial plexus block   Pre-Anesthetic Checklist: , timeout performed,  Correct Patient, Correct Site, Correct Laterality,  Correct Procedure, Correct Position, site marked,  Risks and benefits discussed,  Surgical consent,  Pre-op evaluation,  At surgeon's request and post-op pain management  Laterality: Left  Prep: Maximum Sterile Barrier Precautions used, chloraprep       Needles:  Injection technique: Single-shot  Needle Type: Echogenic Stimulator Needle     Needle Length: 9cm  Needle Gauge: 22     Additional Needles:   Procedures:,,,, ultrasound used (permanent image in chart),,    Narrative:  Start time: 03/15/2021 3:05 AM End time: 03/15/2021 3:10 AM Injection made incrementally with aspirations every 5 mL.  Performed by: Personally  Anesthesiologist: Pervis Hocking, DO  Additional Notes: Monitors applied. No increased pain on injection. No increased resistance to injection. Injection made in 5cc increments. Good needle visualization. Patient tolerated procedure well.

## 2021-03-15 NOTE — Discharge Instructions (Signed)
   Kevin M. Supple, [name], F.A.A.O.S. [facility]izing in Arthroscopic and Reconstructive Surgery of the Shoulder [phone] [address] - [place], [place] [address] - Fax [phone]  POST-OP SHOULDER ARTHROSCOPIC ROTATOR CUFF AND/OR LABRAL REPAIR INSTRUCTIONS  1. Call the office at [phone] to schedule your first post-op appointment 7-10 days from the date of your surgery.  2. Leave the steri-strips in place over your incisions when performing dressing changes and showering. You may remove your dressings and begin showering 72 hours from surgery. You can expect drainage that is clear to bloody in nature that occasionally will soak through your dressings. If this occurs go ahead and perform a dressing change. The drainage should lessen daily and when there is no drainage from your incisions feel free to go without a dressing.  3. Wear your sling/immobilizer at all times except to perform the exercises below or to occasionally let your arm dangle by your side to stretch your elbow. You also need to sleep in your sling immobilizer until instructed otherwise.  4. Range of motion to your elbow, wrist, and hand are encouraged 3-5 times daily. Exercise to your hand and fingers helps to reduce swelling you may experience.  5. Utilize ice to the shoulder 3-4 times minimum a day and additionally if you are experiencing pain.  6. You may one-armed drive when safely off of narcotics and muscle relaxants. You may use your hand that is in the sling to support the steering wheel only. However, should it be your right arm that is in the sling it is not to be used for gear shifting in a manual transmission.  7. Pain control following an exparel block  To help control your post-operative pain you received a nerve block  performed with Exparel which is a long acting anesthetic (numbing agent) which can provide pain relief and sensations of numbness (and relief of pain) in the  operative shoulder and arm for up to 3 days. Sometimes it provides mixed relief, meaning you may still have numbness in certain areas of the arm but can still be able to move  parts of that arm, hand, and fingers. We recommend that your prescribed pain medications  be used as needed. We do not feel it is necessary to "pre medicate" and "stay ahead" of pain.  Taking narcotic pain medications when you are not having any pain can lead to unnecessary and potentially dangerous side effects.    8. Pain medications can produce constipation along with their use. If you experience this, the use of an over the counter stool softener or laxative daily is recommended.   9. For additional questions or concerns, please do not hesitate to call the office. If after hours there is an answering service to forward your concerns to the physician on call.  POST-OP EXERCISES  Pendulum Exercises  Perform pendulum exercises while standing and bending at the waist. Support your uninvolved arm on a table or chair and allow your operated arm to hang freely. Make sure to do these exercises passively - not using you shoulder muscle.  Repeat 20 times. Do 3 sessions per day.  

## 2021-03-15 NOTE — Op Note (Signed)
03/15/2021  5:54 PM  PATIENT:   Timothy Lee  70 y.o. male  PRE-OPERATIVE DIAGNOSIS:  Left shoulder impingement, acromioclavicular osteoarthritis, partial rotator cuff tear  POST-OPERATIVE DIAGNOSIS:  1.  Chronic left shoulder impingement syndrome  2.  Left shoulder symptomatic AC joint arthritis  3.  Left shoulder full-thickness rotator cuff tear  4.  Left shoulder extensive degenerative labral tear.  PROCEDURE:  1.  Left shoulder examination under anesthesia.  2.  Left shoulder glenohumeral joint diagnostic arthroscopy  3.  Extensive debridement of degenerative labral tear and limited synovectomy  4.  Arthroscopic subacromial decompression and bursectomy  5.  Arthroscopic distal clavicle resection  6.  Arthroscopic rotator cuff repair using a double row suture bridge repair construct  SURGEON:  Genessis Flanary, Metta Clines [name]  ASSISTANTS: Jenetta Loges, PA-C  ANESTHESIA:   General endotracheal and interscalene block with Exparel  EBL: Minimal  SPECIMEN: None  Drains: None   PATIENT DISPOSITION:  PACU - hemodynamically stable.    PLAN OF CARE: Discharge to home after PACU  Brief history:  Timothy Lee is a 70 year old gentleman followed for chronic left shoulder pain which has been increasingly problematic has failed prolonged attempts at conservative management.  Due to his ongoing functional imitations he is brought to the operating this time for planned left shoulder arthroscopy as described below.  Preoperatively he was counseled regarding treatment options as well as the potential risks versus benefits thereof.  Possible surgical complications were reviewed including bleeding, infection, neurovascular injury, persistence of pain, loss of motion, anesthetic complication, recurrence of rotator cuff tear, and possible need for additional surgery.  He understands, and accepts, and agrees to the plan procedure.  Procedure detail:  After undergoing routine preop  evaluation the patient received prophylactic antibiotics and interscalene block with Exparel was established in the holding area by the anesthesia department.  The patient was subsequently placed supine on the operating table and underwent the smooth induction of a general endotracheal anesthesia.  Turned to the right lateral decubitus position on the beanbag and appropriately padded and protected.  Left shoulder examination under anesthesia revealed full motion.  No instability patterns noted.  Left arm then suspended at 70 degrees of abduction with 15 pounds of traction in the left shoulder girdle region was sterilely prepped and draped in standard fashion.  Timeout was called.  A posterior portal was established in the glenohumeral joint and anterior portal established under direct visualization.  The articular surfaces were found to be in good condition.  There is diffuse synovitis and a limited synovectomy was performed.  Degenerative labral tearing was noted anteriorly and superiorly which created back to a stable margin with a shaver.  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  Debridement of the rotator cuff was performed from the articular side and we did identify an obvious full-thickness degenerative defect of the distal supraspinatus.  At this point fluid and instruments Removed from the glenohumeral joint.  The arm was draped in the 30 degrees of abduction with the arthroscope introduced in the subacromial space of the posterior portal and a direct lateral portal established in the subacromial space.  Abundant dense bursal tissue and multiple adhesions were encountered and these were all divided and excised with combination the shaver and Stryker wand.  The wand was then used to remove the periosteum from the undersurface of the anterior half of the  acromion and a subacromial decompression  was then performed with a bur crating a type I morphology.  A portal was then established directly anterior to the distal clavicle and a distal clavicle resection was performed of the dura.  Care was taken to confirm visualization of the entire comminuted of the distal clavicle to ensure adequate removal of bone.  We then completed the subacromial/subdeltoid bursectomy.  Rotator cuff tear was identified on the bursal side and a shaver was used to abrade the tear back to a healthy margin and ultimately put.  It was found to be approximately 3 cm in width.  The greater tuberosity was prepared removing soft tissue and abrading the footprint to a bleeding bed.  Through a stab wound on the lateral margin of the acromion we placed 2 Arthrex swivel lock suture anchors each loaded with a fiber tape equidistant across the width of the footprint and the 8 suture limbs were then shuttled equidistant across the width of the rotator cuff tear using the scorpion suture passer.  The suture limbs were then shuttled in an alternating fashion into 2 lateral anchors creating a double row repair which allowed excellent ray apposition of the margin of the rotator cuff tendon across the footprint on the tuberosity and the overall construct was much to our satisfaction.  The suture limbs were all then clipped.  Final hemostasis was obtained.  Fluid and incensed removed.  The portals were closed with a Monocryl and a Steri-Strip.  Dry dressing taped about the left shoulder left arm is placed in sling mobilized and placed in flexion, extubated, and taken to the recovery room in stable condition.  Jenetta Loges, PA-C was utilized as an Environmental consultant throughout this case, essential for help with positioning the patient, positioning extremity, tissue manipulation, implantation of the prosthesis, suture management, wound closure, and intraoperative decision-making. Marin Shutter MD   Contact # [phone]

## 2021-03-15 NOTE — Anesthesia Procedure Notes (Signed)
Procedure Name: Intubation Date/Time: 03/15/2021 4:17 PM Performed by: Lissa Morales, CRNA Pre-anesthesia Checklist: Patient identified, Emergency Drugs available, Suction available and Patient being monitored Patient Re-evaluated:Patient Re-evaluated prior to induction Oxygen Delivery Method: Circle system utilized Preoxygenation: Pre-oxygenation with 100% oxygen Induction Type: IV induction Ventilation: Mask ventilation without difficulty Laryngoscope Size: Mac and 4 Grade View: Grade II Tube type: Oral Tube size: 8.0 mm Number of attempts: 1 Airway Equipment and Method: Stylet and Oral airway Placement Confirmation: ETT inserted through vocal cords under direct vision, positive ETCO2 and breath sounds checked- equal and bilateral Secured at: 23 cm Tube secured with: Tape Dental Injury: Teeth and Oropharynx as per pre-operative assessment  Comments: Vocal folds and arytenoids very swollen

## 2021-03-15 NOTE — H&P (Signed)
Timothy Lee    Chief Complaint: Left shoulder impingement, acromioclavicular osteoarthritis, partial rotator cuff tear HPI: The patient is a 70 y.o. male with chronic left shoulder pain related to impingement syndrome and AC joint arthritis with previous MRI scan from 2020 showing a high-grade partial and likely small full-thickness tear of the rotator cuff.  Due to his persistent pain and increasing functional mutations he is brought to the operating room at this time for planned left shoulder arthroscopy with anticipated rotator cuff repair.    Past Medical History:  Diagnosis Date   Arthritis    Bladder cancer [facility]) urologist-  dr Kipp Brood   Borderline diabetes    Bursitis of both shoulders    CAD (coronary artery disease), native coronary artery    Cath 03/2019 with 20% prox and 60% mid LAD stenosis >>medical management recommended   Chronic systolic CHF (congestive heart failure), NYHA class 2 (HCC)    EF 29% by cMRI>>improved to 50-55% by echo 02/2021   Diabetes mellitus without complication ([place])    Disorder of male genital organs, unspecified    Dyspnea    Esophagus disorder    Constricted Esophagus, per New Patient Packet-PSC    History of basal cell carcinoma (BCC) excision    05/ 2018  right forehead   HLD (hyperlipidemia)    Hypertension    Mild acid reflux no meds   Nocturia    OSA (obstructive sleep apnea)    per pt has not used cpap in years   Renal cyst, left 11/2011   16 mm   Sleep apnea    Per New Patient Packet-PSC    Smokers' cough ([place])    SOB (shortness of breath) on exertion     Past Surgical History:  Procedure Laterality Date   CYSTOSCOPY WITH BIOPSY  03/13/2012   Procedure: CYSTOSCOPY WITH BIOPSY;  Surgeon: Fredricka Bonine, MD;  Location: [facility];  Service: Urology;  Laterality: N/A;      ESOPHAGEAL DILATION  05/20/1986   Esophagus Stretch, per New Patient Packet-PSC    RIGHT/LEFT HEART CATH AND CORONARY ANGIOGRAPHY  N/A 04/16/2019   Procedure: RIGHT/LEFT HEART CATH AND CORONARY ANGIOGRAPHY;  Surgeon: Troy Sine, MD;  Location: [place] [facility];  Service: Cardiovascular;  Laterality: N/A;   TONSILLECTOMY  age 57   TRANSURETHRAL RESECTION OF BLADDER TUMOR N/A 05/23/2017   Procedure: TRANSURETHRAL RESECTION OF BLADDER TUMOR / (TURBT) WITH INSTILLATION OF EPIRUBICIN;  Surgeon: [name], MD;  Location: [place];  Service: Urology;  Laterality: N/A;   UVULECTOMY  05/20/1990   Dr.Byers, per new patient packet    UVULOPALATOPHARYNGOPLASTY  2005   osa    Family History  Problem Relation Age of Onset   Diabetes Mother    Dementia Mother    Stroke Mother    Arthritis Mother    Hypertension Mother    Multiple sclerosis Father    Diabetes Father    Diabetes Sister    Hypertension Sister    ADD / ADHD Son    Migraines Daughter    ADD / ADHD Daughter    Cancer Maternal Grandmother     Social History:  reports that he has been smoking cigarettes. He has a 23.50 pack-year smoking history. He quit smokeless tobacco use about 52 years ago.  His smokeless tobacco use included chew. He reports current alcohol use. He reports current drug use. Drug: Marijuana.   Medications Prior to Admission  Medication Sig Dispense  Refill   acetaminophen (TYLENOL) 500 MG tablet Take 2 tablets (1,000 mg total) by mouth every 8 (eight) hours as needed for moderate pain. 30 tablet 0   albuterol (VENTOLIN HFA) 108 (90 Base) MCG/ACT inhaler INHALE 2 PUFFS INTO THE LUNGS EVERY 6 HOURS AS NEEDED FOR WHEEZING OR SHORTNESS OF BREATH. (Patient taking differently: Inhale 2 puffs into the lungs every 6 (six) hours as needed for wheezing or shortness of breath.) 8.5 g 5   ANORO ELLIPTA 62.5-25 MCG/INH AEPB INHALE 1 PUFF INTO THE LUNGS ONCE DAILY. (Patient taking differently: Inhale 1 puff into the lungs daily.) 60 each 0   aspirin 81 MG chewable tablet Chew 1 tablet (81 mg total) by mouth daily. 90 tablet 0    atorvastatin (LIPITOR) 80 MG tablet TAKE ONE TABLET BY MOUTH ONCE DAILY AT 6PM. (Patient taking differently: Take 80 mg by mouth daily.) 90 tablet 2   carisoprodol (SOMA) 350 MG tablet Take 350 mg by mouth at bedtime as needed for muscle spasms.     dapagliflozin propanediol (FARXIGA) 10 MG TABS tablet Take 1 tablet (10 mg total) by mouth daily before breakfast. 90 tablet 2   Dulaglutide (TRULICITY) 9.14 NW/2.9FA SOPN Inject 0.75 mg into the skin once a week. (Patient taking differently: Inject 0.75 mg into the skin once a week. Sunday) 3 mL 1   furosemide (LASIX) 20 MG tablet Take 1 tablet (20 mg total) by mouth daily. 90 tablet 2   glipiZIDE (GLUCOTROL) 5 MG tablet TAKE (1) TABLET BY MOUTH TWICE DAILY. (Patient taking differently: Take 5 mg by mouth 2 (two) times daily before a meal.) 180 tablet 1   Glucosamine-Chondroitin-MSM (GLUCOS-CHOND-MSM-DOUBLE STR PO) Take 1 tablet by mouth daily.     levocetirizine (XYZAL) 5 MG tablet Take 5 mg by mouth daily as needed for allergies.     metFORMIN (GLUCOPHAGE) 1000 MG tablet TAKE (1) TABLET BY MOUTH TWICE A DAY WITH MEALS (BREAKFAST AND SUPPER). (Patient taking differently: Take 1,000 mg by mouth 2 (two) times daily with a meal.) 60 tablet 0   metoprolol succinate (TOPROL XL) 100 MG 24 hr tablet Take 1 tablet (100 mg total) by mouth daily. Take with or immediately following a meal. 90 tablet 3   metoprolol succinate (TOPROL XL) 25 MG 24 hr tablet Take 1 tablet (25 mg total) by mouth daily. (Patient taking differently: Take 25 mg by mouth every evening.) 90 tablet 3   Multiple Vitamin (MULTIVITAMIN WITH MINERALS) TABS tablet Take 1 tablet by mouth daily. 90 tablet 1   potassium chloride SA (KLOR-CON) 20 MEQ tablet Take 2 tablets (40 mEq total) by mouth daily. 180 tablet 2   sacubitril-valsartan (ENTRESTO) 97-103 MG Take 1 tablet by mouth 2 (two) times daily. Please make yearly appt with Dr. Radford Pax for November 2022 for future refills. Thank you 1st attempt 180  tablet 0   spironolactone (ALDACTONE) 25 MG tablet TAKE (1) TABLET BY MOUTH ONCE DAILY. (Patient taking differently: Take 25 mg by mouth daily.) 90 tablet 3   ACCU-CHEK GUIDE test strip USE TO TEST BLOOD SUGAR DAILY. 100 strip 0   Accu-Chek Softclix Lancets lancets USE TO TEST BLOOD SUGAR DAILY. 100 each 0     Physical Exam: Left shoulder demonstrates painful and guarded motion is noted at his recent office visits.  He has a markedly positive impingement sign.  Pain with manual muscle testing but negative drop arm.  Plain radiographs show the joint space to be well preserved.  His previous MRI  scan does show a high-grade distal rotator cuff tear with with likely small full-thickness defect.  This was 2 years ago.  His symptoms have deteriorated since that time.  Vitals  Temp:  [98 F (36.7 C)] 98 F (36.7 C) (10/27 1354) Pulse Rate:  [80-88] 88 (10/27 1514) Resp:  [14-21] 21 (10/27 1514) BP: (128-140)/(79-85) 135/85 (10/27 1514) SpO2:  [97 %-98 %] 97 % (10/27 1514) Weight:  [114.8 kg] 114.8 kg (10/27 1354)  Assessment/Plan  Impression: Left shoulder impingement, acromioclavicular osteoarthritis, partial rotator cuff tear  Plan of Action: Procedure(s): Left shoulder arthroscopy, debridement, subacromial decompression, distal clavicle resection, possible rotator cuff repair  Nevia Henkin M Kano Heckmann 03/15/2021, 3:22 PM Contact # [phone]

## 2021-03-16 ENCOUNTER — Encounter (HOSPITAL_COMMUNITY): Payer: Self-pay | Admitting: Orthopedic Surgery

## 2021-03-16 DIAGNOSIS — M25122 Fistula, left elbow: Secondary | ICD-10-CM | POA: Diagnosis not present

## 2021-03-16 DIAGNOSIS — M7542 Impingement syndrome of left shoulder: Secondary | ICD-10-CM | POA: Diagnosis not present

## 2021-03-16 DIAGNOSIS — M24012 Loose body in left shoulder: Secondary | ICD-10-CM | POA: Diagnosis not present

## 2021-03-16 DIAGNOSIS — Z4889 Encounter for other specified surgical aftercare: Secondary | ICD-10-CM | POA: Diagnosis not present

## 2021-03-16 DIAGNOSIS — M25512 Pain in left shoulder: Secondary | ICD-10-CM | POA: Diagnosis not present

## 2021-03-16 DIAGNOSIS — I890 Lymphedema, not elsewhere classified: Secondary | ICD-10-CM | POA: Diagnosis not present

## 2021-03-20 NOTE — Telephone Encounter (Addendum)
Patient is scheduled for lab study on 04/29/21. Patient understands her sleep study will be done at [facility] sleep lab. Patient understands she will receive a sleep packet in a week or so. Patient understands to call if she does not receive the sleep packet in a timely manner.  Left detailed message on voicemail with date and time of titration and informed patient to call back to confirm or reschedule.

## 2021-03-26 DIAGNOSIS — M25512 Pain in left shoulder: Secondary | ICD-10-CM | POA: Diagnosis not present

## 2021-03-29 ENCOUNTER — Telehealth: Payer: Self-pay

## 2021-03-29 ENCOUNTER — Telehealth: Payer: Self-pay | Admitting: Cardiology

## 2021-03-29 NOTE — Telephone Encounter (Signed)
**Note De-Identified Vedant Shehadeh Obfuscation** The pts wife/DPR is aware that we are leaving the pt a [place] and [place] pt asst application and 2 weeks of Farxiga 10 mg samples in the front office for them to pick up.  She is aware to complete the pts part of the application, obtain required documents per [place] and [place] if any, and to leave at the front desk at Dr Landis Gandy office at [facility] on [address] in [place] and that we will complete the providers page and will fax to [place] and [place].  She thanked me for our help.

## 2021-03-29 NOTE — Telephone Encounter (Signed)
  Patient calling the office for samples of medication:   1.  What medication and dosage are you requesting samples for? dapagliflozin propanediol (FARXIGA) 10 MG TABS tablet  2.  Are you currently out of this medication? Yes    Pt is in donut hole and will cost $400 to get 90 days supply

## 2021-03-29 NOTE — Telephone Encounter (Signed)
Leaving pt 2 boxes of Farxiga 10 mg and pt asst application at the front desk for pt to pick up. Lot: [id number]  Exp: 09/17/2023  thanks

## 2021-03-29 NOTE — Telephone Encounter (Signed)
Hey lynn, LPN, can you please advise on this matter? Thanks  

## 2021-04-05 DIAGNOSIS — M25512 Pain in left shoulder: Secondary | ICD-10-CM | POA: Diagnosis not present

## 2021-04-11 DIAGNOSIS — M25512 Pain in left shoulder: Secondary | ICD-10-CM | POA: Diagnosis not present

## 2021-04-16 ENCOUNTER — Other Ambulatory Visit: Payer: Self-pay | Admitting: Nurse Practitioner

## 2021-04-16 DIAGNOSIS — E1165 Type 2 diabetes mellitus with hyperglycemia: Secondary | ICD-10-CM

## 2021-04-16 MED ORDER — METFORMIN HCL 1000 MG PO TABS
ORAL_TABLET | ORAL | 5 refills | Status: DC
Start: 2021-04-16 — End: 2022-05-17

## 2021-04-18 ENCOUNTER — Other Ambulatory Visit: Payer: Self-pay | Admitting: Nurse Practitioner

## 2021-04-29 ENCOUNTER — Ambulatory Visit (HOSPITAL_BASED_OUTPATIENT_CLINIC_OR_DEPARTMENT_OTHER): Payer: Medicare Other | Admitting: Cardiology

## 2021-05-01 ENCOUNTER — Telehealth: Payer: Self-pay

## 2021-05-01 NOTE — Telephone Encounter (Signed)
Letter has been sent to patient instructing them to call us if they are still interested in completing their sleep study. If we have not received a response from the patient within 30 days of this notice, the order will be cancelled and they will need to discuss the need for a sleep study at their next office visit.  ° °

## 2021-05-23 ENCOUNTER — Other Ambulatory Visit: Payer: Self-pay | Admitting: Nurse Practitioner

## 2021-05-23 ENCOUNTER — Other Ambulatory Visit: Payer: Self-pay | Admitting: Cardiology

## 2021-05-23 DIAGNOSIS — J209 Acute bronchitis, unspecified: Secondary | ICD-10-CM

## 2021-05-24 MED ORDER — ENTRESTO 97-103 MG PO TABS: 1.0000 | ORAL_TABLET | Freq: Two times a day (BID) | ORAL | 3 refills | Status: DC

## 2021-05-31 ENCOUNTER — Other Ambulatory Visit: Payer: Medicare Other

## 2021-06-04 ENCOUNTER — Ambulatory Visit: Payer: Medicare Other | Admitting: Nurse Practitioner

## 2021-06-13 ENCOUNTER — Other Ambulatory Visit: Payer: Self-pay

## 2021-06-13 ENCOUNTER — Other Ambulatory Visit: Payer: Medicare Other

## 2021-06-13 DIAGNOSIS — E1165 Type 2 diabetes mellitus with hyperglycemia: Secondary | ICD-10-CM | POA: Diagnosis not present

## 2021-06-14 LAB — HEMOGLOBIN A1C
Hgb A1c MFr Bld: 6.7 % of total Hgb — ABNORMAL HIGH (ref <5.7)
Mean Plasma Glucose: 146 mg/dL
eAG (mmol/L): 8.1 mmol/L

## 2021-06-18 ENCOUNTER — Other Ambulatory Visit: Payer: Self-pay

## 2021-06-18 ENCOUNTER — Encounter: Payer: Self-pay | Admitting: Nurse Practitioner

## 2021-06-18 ENCOUNTER — Ambulatory Visit (INDEPENDENT_AMBULATORY_CARE_PROVIDER_SITE_OTHER): Payer: Medicare Other | Admitting: Nurse Practitioner

## 2021-06-18 VITALS — BP 118/70 | HR 88 | Temp 97.3°F | Ht 72.0 in | Wt 248.0 lb

## 2021-06-18 DIAGNOSIS — M25512 Pain in left shoulder: Secondary | ICD-10-CM | POA: Diagnosis not present

## 2021-06-18 DIAGNOSIS — E6609 Other obesity due to excess calories: Secondary | ICD-10-CM

## 2021-06-18 DIAGNOSIS — Z1212 Encounter for screening for malignant neoplasm of rectum: Secondary | ICD-10-CM | POA: Diagnosis not present

## 2021-06-18 DIAGNOSIS — E1165 Type 2 diabetes mellitus with hyperglycemia: Secondary | ICD-10-CM

## 2021-06-18 DIAGNOSIS — I25119 Atherosclerotic heart disease of native coronary artery with unspecified angina pectoris: Secondary | ICD-10-CM | POA: Diagnosis not present

## 2021-06-18 DIAGNOSIS — F17200 Nicotine dependence, unspecified, uncomplicated: Secondary | ICD-10-CM | POA: Diagnosis not present

## 2021-06-18 DIAGNOSIS — Z1211 Encounter for screening for malignant neoplasm of colon: Secondary | ICD-10-CM

## 2021-06-18 DIAGNOSIS — Z6833 Body mass index (BMI) 33.0-33.9, adult: Secondary | ICD-10-CM

## 2021-06-18 DIAGNOSIS — I5022 Chronic systolic (congestive) heart failure: Secondary | ICD-10-CM | POA: Diagnosis not present

## 2021-06-18 DIAGNOSIS — I10 Essential (primary) hypertension: Secondary | ICD-10-CM | POA: Diagnosis not present

## 2021-06-18 DIAGNOSIS — E782 Mixed hyperlipidemia: Secondary | ICD-10-CM

## 2021-06-18 DIAGNOSIS — J449 Chronic obstructive pulmonary disease, unspecified: Secondary | ICD-10-CM | POA: Diagnosis not present

## 2021-06-18 NOTE — Patient Instructions (Signed)
Recommend to get shingles vaccine and TDAP at local pharmacy

## 2021-06-18 NOTE — Progress Notes (Signed)
Careteam: Patient Care Team: Lauree Chandler, NP as PCP - General (Geriatric Medicine) Sueanne Margarita, MD as PCP - Cardiology (Cardiology) Melissa Montane, MD as Consulting Physician (Otolaryngology) [name], MD as Consulting Physician (Urology) Warden Fillers, MD as Consulting Physician (Ophthalmology)  PLACE OF SERVICE:  [place] Directive information Does Patient Have a Medical Advance Directive?: No, Would patient like information on creating a medical advance directive?: No - Patient declined  Allergies  Allergen Reactions   Chantix [Varenicline Tartrate]     Aggression     Chief Complaint  Patient presents with   Medical Management of Chronic Issues    4 month follow-up and discuss labs (patient viewed via mychart). Patient denies receiving any vaccines since last visit. Discuss need for td/tdap, shingrix, covid boosters and cologuard or postpone if patient refuses.       HPI: Patient is a 71 y.o. male here for follow up. W  Describes diet as regular, no regulation. Taking medication as prescribed with the help of his wife. would like to cut back on some diabetes medications. Recent a1c at goal. He is currently on metformin, glipizide, farxiga, trulicity   Obesity- has lost weight since on trulicity and with improved A1c  Gets physical exercise by running errands.   Pt had left shoulder arthroscopy in October, doing well post op wit good ROM. Continues to go to PT. Pain managed through orthopedic  Prior to procedure did follow up with cardiology (he was overdue)  CAD- denies chest pains or worsening shortness of breath   CHF- without worsening shortness of breath or chest pain, no swelling. Continue to follow up with cardiology and recent echo. LVF has improved, 50-55% continues on entresto, aldactone, lasix with potassium, toprol, farxiga  Hyperlipidemia- continues on lipitor.   COPD- on anoro  Smoker- strongly encouraged cessation.     Review of Systems:  Review of Systems  Constitutional:  Negative for chills and fever.  Respiratory:  Positive for shortness of breath. Negative for cough, hemoptysis and wheezing.        Some times due to smoking , has cut back to 5 cigs per day. Smoking for 61 years. Started at 71 years old.   Cardiovascular:  Negative for chest pain, palpitations, orthopnea and leg swelling.  Gastrointestinal:  Negative for abdominal pain, constipation, diarrhea, heartburn, nausea and vomiting.  Genitourinary: Negative.   Musculoskeletal:        Left shoulder pain from surgery in October. Takes 500 mg tylenol prn   Psychiatric/Behavioral:  Negative for depression. The patient is not nervous/anxious and does not have insomnia.    Past Medical History:  Diagnosis Date   Arthritis    Bladder cancer [facility]) urologist-  dr Kipp Brood   Borderline diabetes    Bursitis of both shoulders    CAD (coronary artery disease), native coronary artery    Cath 03/2019 with 20% prox and 60% mid LAD stenosis >>medical management recommended   Chronic systolic CHF (congestive heart failure), NYHA class 2 (HCC)    EF 29% by cMRI>>improved to 50-55% by echo 02/2021   Diabetes mellitus without complication ([place])    Disorder of male genital organs, unspecified    Dyspnea    Esophagus disorder    Constricted Esophagus, per New Patient Packet-PSC    History of basal cell carcinoma (BCC) excision    05/ 2018  right forehead   HLD (hyperlipidemia)    Hypertension    Mild acid reflux no  meds   Nocturia    OSA (obstructive sleep apnea)    per pt has not used cpap in years   Renal cyst, left 11/2011   16 mm   Sleep apnea    Per New Patient Packet-PSC    Smokers' cough ([place])    SOB (shortness of breath) on exertion    Past Surgical History:  Procedure Laterality Date   CYSTOSCOPY WITH BIOPSY  03/13/2012   Procedure: CYSTOSCOPY WITH BIOPSY;  Surgeon: Fredricka Bonine, MD;  Location: [facility];   Service: Urology;  Laterality: N/A;      ESOPHAGEAL DILATION  05/20/1986   Esophagus Stretch, per New Patient Packet-PSC    RIGHT/LEFT HEART CATH AND CORONARY ANGIOGRAPHY N/A 04/16/2019   Procedure: RIGHT/LEFT HEART CATH AND CORONARY ANGIOGRAPHY;  Surgeon: Troy Sine, MD;  Location: [place] [facility];  Service: Cardiovascular;  Laterality: N/A;   SHOULDER ARTHROSCOPY WITH ROTATOR CUFF REPAIR Left 03/15/2021   Procedure: Left shoulder arthroscopy, debridement, subacromial decompression, distal clavicle resection, possible rotator cuff repair;  Surgeon: Justice Britain, MD;  Location: [facility];  Service: Orthopedics;  Laterality: Left;   TONSILLECTOMY  age 89   TRANSURETHRAL RESECTION OF BLADDER TUMOR N/A 05/23/2017   Procedure: TRANSURETHRAL RESECTION OF BLADDER TUMOR / (TURBT) WITH INSTILLATION OF EPIRUBICIN;  Surgeon: [name], MD;  Location: [facility];  Service: Urology;  Laterality: N/A;   UVULECTOMY  05/20/1990   Dr.Byers, per new patient packet    UVULOPALATOPHARYNGOPLASTY  2005   osa   Social History:   reports that he has been smoking cigarettes. He has a 11.75 pack-year smoking history. He quit smokeless tobacco use about 53 years ago.  His smokeless tobacco use included chew. He reports current alcohol use. He reports current drug use. Drug: Marijuana.  Family History  Problem Relation Age of Onset   Diabetes Mother    Dementia Mother    Stroke Mother    Arthritis Mother    Hypertension Mother    Multiple sclerosis Father    Diabetes Father    Diabetes Sister    Hypertension Sister    ADD / ADHD Son    Migraines Daughter    ADD / ADHD Daughter    Cancer Maternal Grandmother     Medications: Patient's Medications  New Prescriptions   No medications on file  Previous Medications   ACCU-CHEK GUIDE TEST STRIP    USE TO TEST BLOOD SUGAR DAILY.   ACCU-CHEK SOFTCLIX LANCETS LANCETS    USE TO TEST BLOOD SUGAR DAILY.   ACETAMINOPHEN (TYLENOL) 500  MG TABLET    Take 2 tablets (1,000 mg total) by mouth every 8 (eight) hours as needed for moderate pain.   ALBUTEROL (VENTOLIN HFA) 108 (90 BASE) MCG/ACT INHALER    INHALE 2 PUFFS INTO THE LUNGS EVERY 6 HOURS AS NEEDED FOR WHEEZING OR SHORTNESS OF BREATH.   ASPIRIN 81 MG CHEWABLE TABLET    Chew 1 tablet (81 mg total) by mouth daily.   ATORVASTATIN (LIPITOR) 80 MG TABLET    TAKE ONE TABLET BY MOUTH ONCE DAILY AT 6PM.   CARISOPRODOL (SOMA) 350 MG TABLET    Take 1 tablet (350 mg total) by mouth at bedtime as needed for muscle spasms.   DAPAGLIFLOZIN PROPANEDIOL (FARXIGA) 10 MG TABS TABLET    Take 1 tablet (10 mg total) by mouth daily before breakfast.   DULAGLUTIDE (TRULICITY) 7.34 LP/3.7TK SOPN    Inject 0.75 mg into the skin once  a week.   FUROSEMIDE (LASIX) 20 MG TABLET    Take 1 tablet (20 mg total) by mouth daily.   GLIPIZIDE (GLUCOTROL) 5 MG TABLET    TAKE (1) TABLET BY MOUTH TWICE DAILY.   GLUCOSAMINE-CHONDROITIN-MSM (GLUCOS-CHOND-MSM-DOUBLE STR PO)    Take 1 tablet by mouth daily.   LEVOCETIRIZINE (XYZAL) 5 MG TABLET    Take 5 mg by mouth daily as needed for allergies.   METFORMIN (GLUCOPHAGE) 1000 MG TABLET    TAKE (1) TABLET BY MOUTH TWICE A DAY WITH MEALS (BREAKFAST AND SUPPER).   METOPROLOL SUCCINATE (TOPROL XL) 100 MG 24 HR TABLET    Take 1 tablet (100 mg total) by mouth daily. Take with or immediately following a meal.   METOPROLOL SUCCINATE (TOPROL XL) 25 MG 24 HR TABLET    Take 1 tablet (25 mg total) by mouth daily.   MULTIPLE VITAMIN (MULTIVITAMIN WITH MINERALS) TABS TABLET    Take 1 tablet by mouth daily.   OXYCODONE-ACETAMINOPHEN (PERCOCET) 5-325 MG TABLET    Take 1 tablet by mouth every 4 (four) hours as needed (max 6 q).   POTASSIUM CHLORIDE SA (KLOR-CON) 20 MEQ TABLET    Take 2 tablets (40 mEq total) by mouth daily.   SACUBITRIL-VALSARTAN (ENTRESTO) 97-103 MG    Take 1 tablet by mouth 2 (two) times daily.   SPIRONOLACTONE (ALDACTONE) 25 MG TABLET    TAKE (1) TABLET BY MOUTH ONCE  DAILY.   UMECLIDINIUM-VILANTEROL (ANORO ELLIPTA) 62.5-25 MCG/ACT AEPB    INHALE 1 PUFF INTO THE LUNGS ONCE DAILY.  Modified Medications   No medications on file  Discontinued Medications   ONDANSETRON (ZOFRAN) 4 MG TABLET    Take 1 tablet (4 mg total) by mouth every 8 (eight) hours as needed for nausea or vomiting.    Physical Exam:  Vitals:   06/18/21 0950  BP: 118/70  Pulse: 88  Temp: (!) 97.3 F (36.3 C)  TempSrc: Temporal  SpO2: 97%  Weight: 248 lb (112.5 kg)  Height: 6' (1.829 m)   Body mass index is 33.63 kg/m. Wt Readings from Last 3 Encounters:  06/18/21 248 lb (112.5 kg)  03/15/21 253 lb (114.8 kg)  03/01/21 253 lb (114.8 kg)    Physical Exam Constitutional:      General: He is not in acute distress.    Appearance: Normal appearance. He is normal weight. He is not ill-appearing.  HENT:     Right Ear: Tympanic membrane, ear canal and external ear normal. There is no impacted cerumen.     Left Ear: Tympanic membrane, ear canal and external ear normal. There is no impacted cerumen.     Nose: Nose normal.     Mouth/Throat:     Mouth: Mucous membranes are moist.     Pharynx: Oropharynx is clear.  Eyes:     Conjunctiva/sclera: Conjunctivae normal.  Cardiovascular:     Rate and Rhythm: Normal rate and regular rhythm.     Pulses: Normal pulses.     Heart sounds: Normal heart sounds.  Pulmonary:     Effort: Pulmonary effort is normal.     Breath sounds: Normal breath sounds.  Abdominal:     General: Bowel sounds are normal.  Musculoskeletal:        General: Normal range of motion.  Skin:    General: Skin is warm.  Neurological:     Mental Status: He is alert and oriented to person, place, and time.  Psychiatric:  Mood and Affect: Mood normal.        Behavior: Behavior normal.        Thought Content: Thought content normal.        Judgment: Judgment normal.    Labs reviewed: Basic Metabolic Panel: Recent Labs    02/08/21 0810 02/12/21 1033  03/01/21 1215  NA 138 139 139  K 4.5 4.4 4.2  CL 103 98 103  CO2 _0 GLUCOSE 176* 200* 149*  BUN _1 CREATININE 0.97 0.93 0.87  CALCIUM 10.2 10.2 10.4*   Liver Function Tests: Recent Labs    06/28/20 1113 02/08/21 0810 02/12/21 1033  AST _2 ALT _3 ALKPHOS  --   --  57  BILITOT 0.9 1.2 1.0  PROT 6.1 6.4 6.6  ALBUMIN  --   --  4.8   No results for input(s): LIPASE, AMYLASE in the last 8760 hours. No results for input(s): AMMONIA in the last 8760 hours. CBC: Recent Labs    06/28/20 1113 02/08/21 0810 03/01/21 1215  WBC 8.8 8.7 10.5  NEUTROABS 5,650 5,533  --   HGB 14.9 15.9 16.4  HCT 44.2 47.0 48.5  MCV 91.9 91.4 91.2  PLT 273 304 272   Lipid Panel: Recent Labs    02/08/21 0810 02/12/21 1033  CHOL 102 91*  HDL 39* 39*  LDLCALC 37 30  TRIG 179* 121  CHOLHDL 2.6 2.3   TSH: No results for input(s): TSH in the last 8760 hours. A1C: Lab Results  Component Value Date   HGBA1C 6.7 (H) 06/13/2021     Assessment/Plan  1. Type 2 diabetes mellitus with hyperglycemia, without long-term current use of insulin (HCC) -Lost weight, -Stable, A1C 6.7.  - Taking medications as directed.  -Encouraged dietary compliance, routine foot care/monitoring and to keep up with diabetic eye exams through ophthalmology   2. Chronic obstructive pulmonary disease, unspecified COPD type (HCC) - ongoing cough, congestion but stable. Encouraged cessation of smoking. Continues on anoro.   3. Chronic systolic CHF (congestive heart failure), NYHA class 2 (HCC) -Stable, EF improved on recent echo. Euvolemic at this time  -continue current regimen.   4. Smoker -Has cut back on 5 cigarettes per day.  -Work in progress to continue cessation   5. Essential hypertension -Blood pressure well controlled Continue current medications Recheck metabolic panel  - CBC with Differential/Platelet; Future - CMP with eGFR([facility]); Future  6. Encounter for colorectal  cancer screening -lost first box - Cologuard  7. Mixed hyperlipidemia -continue on lipitor.  - CMP with eGFR([facility]); Future - Lipid panel; Future  8. Atherosclerosis of native coronary artery of native heart with angina pectoris ([place]) -continues on asa with statin  9. Class 1 obesity due to excess calories with serious comorbidity and body mass index (BMI) of 33.0 to 33.9 in adult - has lost weight -education provided on healthy weight loss through increase in physical activity and proper nutrition  -pt reports his goal is 220 lbs   Next appt: Return in about 4 months (around 10/16/2021) for routine follow up .  I personally was present during the history, physical exam and medical decision-making activities of this service and have verified that the service and findings are accurately documented in the students note   Janett Billow K. [place], [facility] [phone]

## 2021-08-08 ENCOUNTER — Telehealth: Payer: Self-pay | Admitting: *Deleted

## 2021-08-08 ENCOUNTER — Other Ambulatory Visit: Payer: Self-pay | Admitting: Nurse Practitioner

## 2021-08-08 ENCOUNTER — Ambulatory Visit: Payer: Medicare Other | Admitting: Cardiology

## 2021-08-08 ENCOUNTER — Encounter: Payer: Self-pay | Admitting: Cardiology

## 2021-08-08 ENCOUNTER — Other Ambulatory Visit: Payer: Self-pay

## 2021-08-08 VITALS — BP 126/74 | HR 73 | Ht 71.0 in | Wt 240.0 lb

## 2021-08-08 DIAGNOSIS — I10 Essential (primary) hypertension: Secondary | ICD-10-CM | POA: Diagnosis not present

## 2021-08-08 DIAGNOSIS — I25119 Atherosclerotic heart disease of native coronary artery with unspecified angina pectoris: Secondary | ICD-10-CM

## 2021-08-08 DIAGNOSIS — I5022 Chronic systolic (congestive) heart failure: Secondary | ICD-10-CM

## 2021-08-08 DIAGNOSIS — Z720 Tobacco use: Secondary | ICD-10-CM

## 2021-08-08 DIAGNOSIS — I420 Dilated cardiomyopathy: Secondary | ICD-10-CM | POA: Diagnosis not present

## 2021-08-08 DIAGNOSIS — E7800 Pure hypercholesterolemia, unspecified: Secondary | ICD-10-CM | POA: Diagnosis not present

## 2021-08-08 MED ORDER — DAPAGLIFLOZIN PROPANEDIOL 10 MG PO TABS: 10.0000 mg | ORAL_TABLET | Freq: Every day | ORAL | 3 refills | Status: DC

## 2021-08-08 MED ORDER — FUROSEMIDE 20 MG PO TABS: 20.0000 mg | ORAL_TABLET | Freq: Every day | ORAL | 3 refills | Status: DC

## 2021-08-08 MED ORDER — POTASSIUM CHLORIDE CRYS ER 20 MEQ PO TBCR: 40.0000 meq | EXTENDED_RELEASE_TABLET | Freq: Every day | ORAL | 3 refills | Status: DC

## 2021-08-08 MED ORDER — SPIRONOLACTONE 25 MG PO TABS: ORAL_TABLET | ORAL | 3 refills | Status: DC

## 2021-08-08 MED ORDER — ATORVASTATIN CALCIUM 80 MG PO TABS: ORAL_TABLET | ORAL | 3 refills | Status: DC

## 2021-08-08 MED ORDER — METOPROLOL SUCCINATE ER 100 MG PO TB24: 100.0000 mg | ORAL_TABLET | Freq: Every day | ORAL | 3 refills | Status: DC

## 2021-08-08 MED ORDER — METOPROLOL SUCCINATE ER 25 MG PO TB24: 25.0000 mg | ORAL_TABLET | Freq: Every day | ORAL | 3 refills | Status: DC

## 2021-08-08 MED ORDER — ENTRESTO 97-103 MG PO TABS: 1.0000 | ORAL_TABLET | Freq: Two times a day (BID) | ORAL | 3 refills | Status: DC

## 2021-08-08 NOTE — Addendum Note (Signed)
Addended by: Antonieta Iba on: 08/08/2021 09:57 AM ? ? Modules accepted: Orders ? ?

## 2021-08-08 NOTE — Progress Notes (Addendum)
?Cardiology Office Note:   ? ?Date:  08/08/2021  ? ?ID:  NIZAR CUTLER, DOB 1950/09/02, MRN [id number] ? ?PCP:  Lauree Chandler, NP  ?Cardiologist:  Fransico Him, MD   ? ?Referring MD: Lauree Chandler, NP  ? ?Chief Complaint  ?Patient presents with  ? Coronary Artery Disease  ? Cardiomyopathy  ? Congestive Heart Failure  ? Hypertension  ? Hyperlipidemia  ? ? ? ?History of Present Illness:   ? ?Timothy Lee is a 71 y.o. male with a hx of hypertension, DM2, untreated OSA, COPD and longstanding tobacco use.  He was admitted 03/2019 for acute CHF and COPD exacerbation. Echocardiogram showed LVEF at 35 to 40% with global hypokinesis and left ventricular regional wall motion abnormalities. Cath showed 20% prox and 60% mid LAD stenosis and med management was recommended. He was started on guideline directed medical therapy for HF and high intensity statin was started. He was seen back in office by the PA and was doing well.  cMRI done 12/2019 showed severe LV dysfunction with EF 29% and moderate RV dysfunction with RVEF 36% and no evidence of LGE or amyloidosis.   ? ?He is here today for followup and is doing well.  He has chronic DOE that he says is stable. He denies any chest pain or pressure, PND, orthopnea, LE edema, dizziness, palpitations or syncope. He is compliant with his meds and is tolerating meds with no SE.    ? ?Past Medical History:  ?Diagnosis Date  ? Arthritis   ? Bladder cancer [facility]) urologist-  dr Kipp Brood  ? Borderline diabetes   ? Bursitis of both shoulders   ? CAD (coronary artery disease), native coronary artery   ? Cath 03/2019 with 20% prox and 60% mid LAD stenosis >>medical management recommended  ? Chronic systolic CHF (congestive heart failure), NYHA class 2 ([place])   ? EF 29% by cMRI>>improved to 50-55% by echo 02/2021  ? Diabetes mellitus without complication ([place])   ? Disorder of male genital organs, unspecified   ? Dyspnea   ? Esophagus disorder   ? Constricted Esophagus, per New  Patient Packet-PSC   ? History of basal cell carcinoma (BCC) excision   ? 05/ 2018  right forehead  ? HLD (hyperlipidemia)   ? Hypertension   ? Mild acid reflux no meds  ? Nocturia   ? OSA (obstructive sleep apnea)   ? per pt has not used cpap in years  ? Renal cyst, left 11/2011  ? 16 mm  ? Sleep apnea   ? Per New Patient Packet-PSC   ? Smokers' cough ([place])   ? SOB (shortness of breath) on exertion   ? ? ?Past Surgical History:  ?Procedure Laterality Date  ? CYSTOSCOPY WITH BIOPSY  03/13/2012  ? Procedure: CYSTOSCOPY WITH BIOPSY;  Surgeon: Fredricka Bonine, MD;  Location: [facility];  Service: Urology;  Laterality: N/A;   ?  ? ESOPHAGEAL DILATION  05/20/1986  ? Esophagus Stretch, per New Patient Packet-PSC   ? RIGHT/LEFT HEART CATH AND CORONARY ANGIOGRAPHY N/A 04/16/2019  ? Procedure: RIGHT/LEFT HEART CATH AND CORONARY ANGIOGRAPHY;  Surgeon: Troy Sine, MD;  Location: [facility];  Service: Cardiovascular;  Laterality: N/A;  ? SHOULDER ARTHROSCOPY WITH ROTATOR CUFF REPAIR Left 03/15/2021  ? Procedure: Left shoulder arthroscopy, debridement, subacromial decompression, distal clavicle resection, possible rotator cuff repair;  Surgeon: Justice Britain, MD;  Location: [facility];  Service: Orthopedics;  Laterality: Left;  ? TONSILLECTOMY  age 29  ? TRANSURETHRAL RESECTION OF BLADDER TUMOR N/A 05/23/2017  ? Procedure: TRANSURETHRAL RESECTION OF BLADDER TUMOR / (TURBT) WITH INSTILLATION OF EPIRUBICIN;  Surgeon: [name], MD;  Location: [facility];  Service: Urology;  Laterality: N/A;  ? UVULECTOMY  05/20/1990  ? Dr.Byers, per new patient packet   ? UVULOPALATOPHARYNGOPLASTY  2005  ? osa  ? ? ?Current Medications: ?Current Meds  ?Medication Sig  ? ACCU-CHEK GUIDE test strip USE TO TEST BLOOD SUGAR DAILY.  ? Accu-Chek Softclix Lancets lancets USE TO TEST BLOOD SUGAR DAILY.  ? acetaminophen (TYLENOL) 500 MG tablet Take 2 tablets (1,000 mg total) by mouth every 8 (eight)  hours as needed for moderate pain.  ? albuterol (VENTOLIN HFA) 108 (90 Base) MCG/ACT inhaler INHALE 2 PUFFS INTO THE LUNGS EVERY 6 HOURS AS NEEDED FOR WHEEZING OR SHORTNESS OF BREATH.  ? aspirin 81 MG chewable tablet Chew 1 tablet (81 mg total) by mouth daily.  ? atorvastatin (LIPITOR) 80 MG tablet TAKE ONE TABLET BY MOUTH ONCE DAILY AT 6PM.  ? carisoprodol (SOMA) 350 MG tablet Take 1 tablet (350 mg total) by mouth at bedtime as needed for muscle spasms.  ? dapagliflozin propanediol (FARXIGA) 10 MG TABS tablet Take 1 tablet (10 mg total) by mouth daily before breakfast.  ? Dulaglutide (TRULICITY) 3.21 YY/4.8GN SOPN Inject 0.75 mg into the skin once a week.  ? furosemide (LASIX) 20 MG tablet Take 1 tablet (20 mg total) by mouth daily.  ? glipiZIDE (GLUCOTROL) 5 MG tablet TAKE (1) TABLET BY MOUTH TWICE DAILY.  ? Glucosamine-Chondroitin-MSM (GLUCOS-CHOND-MSM-DOUBLE STR PO) Take 1 tablet by mouth daily.  ? levocetirizine (XYZAL) 5 MG tablet Take 5 mg by mouth daily as needed for allergies.  ? metFORMIN (GLUCOPHAGE) 1000 MG tablet TAKE (1) TABLET BY MOUTH TWICE A DAY WITH MEALS (BREAKFAST AND SUPPER).  ? metoprolol succinate (TOPROL XL) 100 MG 24 hr tablet Take 1 tablet (100 mg total) by mouth daily. Take with or immediately following a meal.  ? metoprolol succinate (TOPROL XL) 25 MG 24 hr tablet Take 1 tablet (25 mg total) by mouth daily.  ? Multiple Vitamin (MULTIVITAMIN WITH MINERALS) TABS tablet Take 1 tablet by mouth daily.  ? oxyCODONE-acetaminophen (PERCOCET) 5-325 MG tablet Take 1 tablet by mouth every 4 (four) hours as needed (max 6 q).  ? potassium chloride SA (KLOR-CON) 20 MEQ tablet Take 2 tablets (40 mEq total) by mouth daily.  ? sacubitril-valsartan (ENTRESTO) 97-103 MG Take 1 tablet by mouth 2 (two) times daily.  ? spironolactone (ALDACTONE) 25 MG tablet TAKE (1) TABLET BY MOUTH ONCE DAILY.  ? umeclidinium-vilanterol (ANORO ELLIPTA) 62.5-25 MCG/ACT AEPB INHALE 1 PUFF INTO THE LUNGS ONCE DAILY.  ?   ? ?Allergies:   Chantix [varenicline tartrate]  ? ?Social History  ? ?Socioeconomic History  ? Marital status: Married  ?  Spouse name: Not on file  ? Number of children: Not on file  ? Years of education: Not on file  ? Highest education level: Not on file  ?Occupational History  ? Not on file  ?Tobacco Use  ? Smoking status: Every Day  ?  Packs/day: 0.25  ?  Years: 47.00  ?  Pack years: 11.75  ?  Types: Cigarettes  ? Smokeless tobacco: Former  ?  Types: Chew  ?  Quit date: 1970  ?Vaping Use  ? Vaping Use: Former  ?Substance and Sexual Activity  ? Alcohol use: Yes  ?  Comment: very seldom- rare  ?  Drug use: Yes  ?  Types: Marijuana  ?  Comment: seldom-rare  ? Sexual activity: Not Currently  ?Other Topics Concern  ? Not on file  ?Social History Narrative  ? Diet: None  ?   ? Caffeine: Yes  ?   ? Married, if yes what year:  Yes, 1990  ?   ? Do you live in a house, apartment, assisted living, condo, trailer, ect: House, 3 stories, 4 persons  ?   ? Pets: 2 dogs, 2 cats   ?   ? Highest level of education: Some College  ?   ? Current/Past profession: Auctioneer   ?   ? Exercise: Some  ?   ?   ? Living Will: Yes  ? DNR: Yes  ? POA/HPOA: Yes  ?   ? Functional Status:  ? Do you have difficulty bathing or dressing yourself? No  ? Do you have difficulty preparing food or eating? No  ? Do you have difficulty managing your medications? No  ? Do you have difficulty managing your finances? No  ? Do you have difficulty affording your medications? No  ? ?Social Determinants of Health  ? ?Financial Resource Strain: Not on file  ?Food Insecurity: Not on file  ?Transportation Needs: Not on file  ?Physical Activity: Not on file  ?Stress: Not on file  ?Social Connections: Not on file  ?  ? ?Family History: ?The patient's family history includes ADD / ADHD in his daughter and son; Arthritis in his mother; Cancer in his maternal grandmother; Dementia in his mother; Diabetes in his father, mother, and sister; Hypertension in his mother  and sister; Migraines in his daughter; Multiple sclerosis in his father; Stroke in his mother. ? ?ROS:   ?Please see the history of present illness.    ?ROS  ?All other systems reviewed and negative.  ? ?EKGs/Labs/Oth

## 2021-08-08 NOTE — Patient Instructions (Signed)
Medication Instructions:  ?Your physician recommends that you continue on your current medications as directed. Please refer to the Current Medication list given to you today. ? ?*If you need a refill on your cardiac medications before your next appointment, please call your pharmacy* ? ?Testing/Procedures: ?Your physician has recommended that you have a sleep study. This test records several body functions during sleep, including: brain activity, eye movement, oxygen and carbon dioxide blood levels, heart rate and rhythm, breathing rate and rhythm, the flow of air through your mouth and nose, snoring, body muscle movements, and chest and belly movement. ? ?Follow-Up: ?At [facility], you and your health needs are our priority.  As part of our continuing mission to provide you with exceptional heart care, we have created designated Provider Care Teams.  These Care Teams include your primary Cardiologist (physician) and Advanced Practice Providers (APPs -  Physician Assistants and Nurse Practitioners) who all work together to provide you with the care you need, when you need it.  ? ?Your next appointment:   ?1 year(s) ? ?The format for your next appointment:   ?In Person ? ?Provider:   ?Fransico Him, MD   ? ?Other Instructions: ? ?YOU HAVE BEEN PRESCRIBED A HOME SLEEP TEST  ? ?Q: Why did my Cardiologist order a Home Sleep Test?  ?A: There is a strong correlation between untreated sleep apnea and increased cardiovascular risk. Clinical Data shows untreated sleep apnea DOUBLES your risk for Stroke, Heart Attack, and All-Cause Mortality.  ? ?Q: What is Sleep Apnea?  ?A: Sleep apnea occurs when soft tissue blocks the airway repeatedly during sleep. Having untreated sleep apnea puts stress on the cardiovascular system and your heart goes without oxygen making your cardiac problems worse. Know that: ?Sleep Apnea hurts H.E.A.R.T.S?.?  ?Heart Failure, Elevated Blood Pressure, Atrial Fibrillation, Resistant Hypertension,  Type 2 Diabetes, Stroke  ? ?Q: What happens now?  ?A: You will be receiving a WatchPAT home sleep test kit in your mailbox from [employer] and called by [phone] for initial setup.  ? ?Q: What if I have questions about taking the test?  ?A: Call for instructions, with 24-hour availability of qualified personnel to answer any questions:  ? ?Patient Help Desk: [phone]  ? ?Once the device is delivered to your home complete the test that night.  ?In the morning, attach the prepaid shipping label included, and put it back in the mailbox if you received the non-disposable device. If you received disposable device please discard in the morning.  ? ?You will be contacted by your Sleep Physician if therapy is needed:  ?Dr. Fransico Him  - Specializes in General Cardiology, Echocardiography and Cardiac Catheterization  ?Board Certified in Sleep Medicine  ? ? ? ?  ?

## 2021-08-08 NOTE — Telephone Encounter (Signed)
Pt was seen in the office today and was ordered an [place] study by Dr. Radford Pax. Pt did not have his phone with him in the office. I have instructed the pt how to put the application on his phone. I have also given the pt the 800 number for help if needed. I have reviewed the instructional tutorial with the pt with verbal understanding. Pt aware to not ope the box until he has been called with the PIN # and ok to proceed. Pt verbalized understanding.  ? ?Set up date 08/08/21 ?

## 2021-08-13 ENCOUNTER — Other Ambulatory Visit: Payer: Self-pay | Admitting: Nurse Practitioner

## 2021-08-13 DIAGNOSIS — E1165 Type 2 diabetes mellitus with hyperglycemia: Secondary | ICD-10-CM

## 2021-08-13 DIAGNOSIS — I5022 Chronic systolic (congestive) heart failure: Secondary | ICD-10-CM

## 2021-08-13 NOTE — Telephone Encounter (Signed)
Called and made the patient aware that he  may proceed with the [facility] Sleep Study. PIN # provided to the patient. Patient made aware that he will be contacted after the test has been read with the results and any recommendations. Patient verbalized understanding and thanked me for the call.  ? ?Pt states he will do sleep study one night this week.  ?

## 2021-08-13 NOTE — Telephone Encounter (Signed)
PER [employer]  No PA REQUIRED ?

## 2021-08-27 NOTE — Telephone Encounter (Signed)
Left message for the pt to call the office. I do not see the sleep study results at this time. Calling pt to check to see if he did the sleep study yet.  ?

## 2021-09-10 NOTE — Telephone Encounter (Signed)
I left message again for the pt in regard to sleep study that he will need to complete study but the end of this week, or will be sent to billing dept.  ?

## 2021-09-12 IMAGING — CT CT ANGIO CHEST
2 of 7 series · 16 of 46 positions shown · IV contrast (omnipaque)
Comparison: Chest radiograph 04/04/2019

CLINICAL DATA: Chest pain shortness of breath for 6 days, cough

EXAM:
CT ANGIOGRAPHY CHEST WITH CONTRAST
TECHNIQUE: Multidetector CT imaging of the chest was performed using the
standard protocol during bolus administration of intravenous
contrast. Multiplanar CT image reconstructions and MIPs were
obtained to evaluate the vascular anatomy.
CONTRAST:  100mL OMNIPAQUE IOHEXOL 350 MG/ML SOLN

[Series 7: thins · axial · 0.83mm/px · z∈[+58,+345]mm · 13 of 462 slices shown]
[im 26/462  lung]
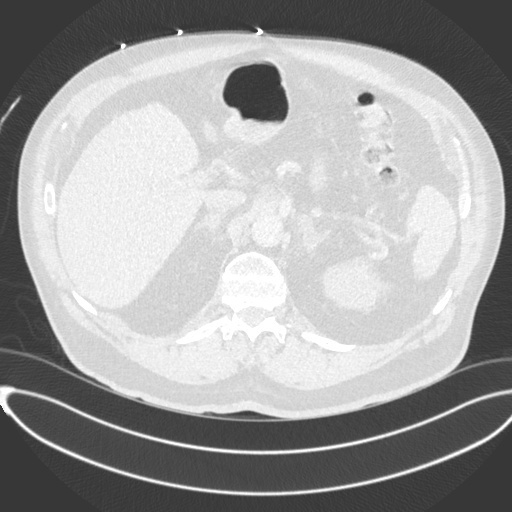
[im 52/462  soft-tissue]
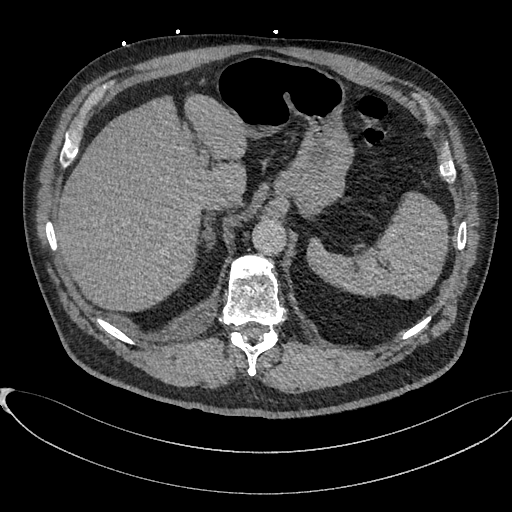
[im 103/462  lung]
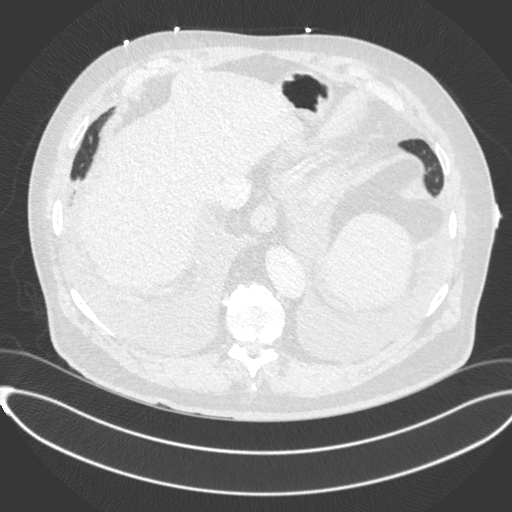
[im 129/462  soft-tissue]
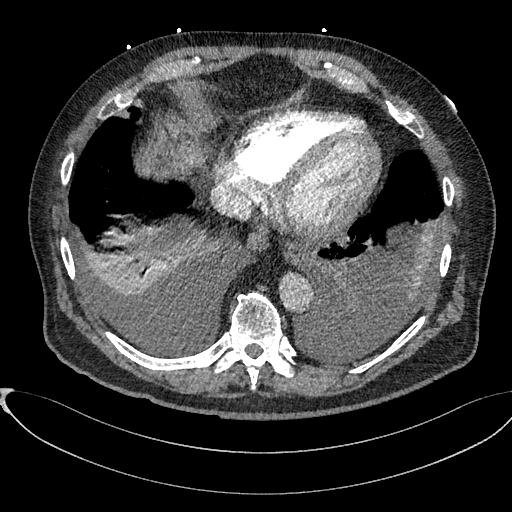
[im 154/462  lung]
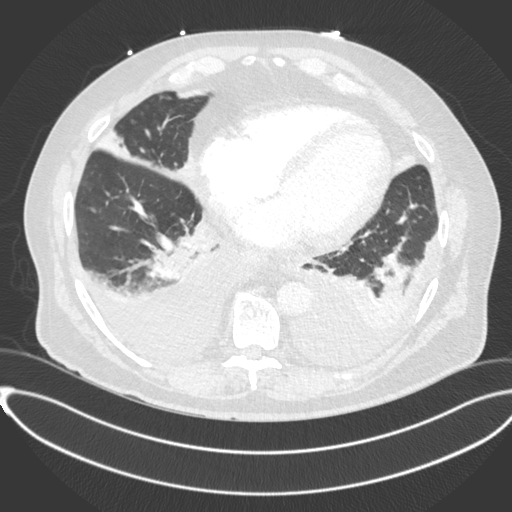
[im 205/462  soft-tissue]
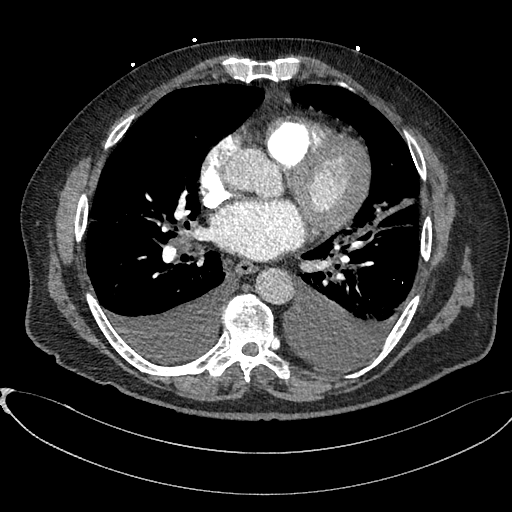
[im 231/462  lung]
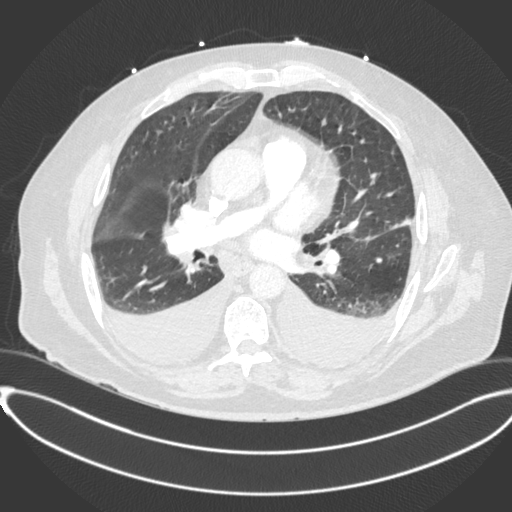
[im 257/462  soft-tissue]
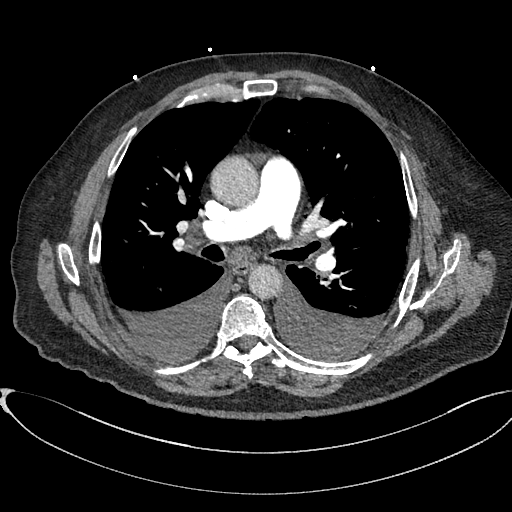
[im 308/462  lung]
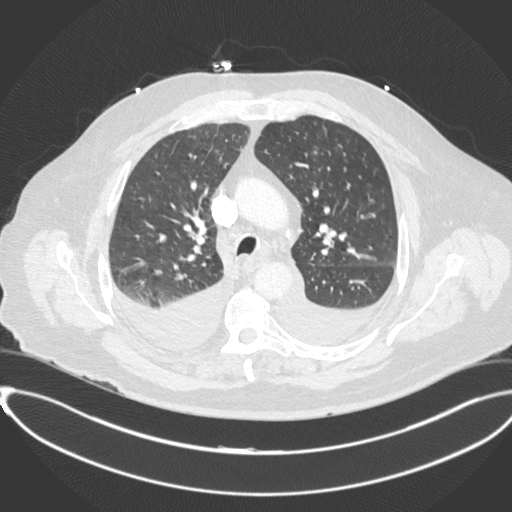
[im 333/462  soft-tissue]
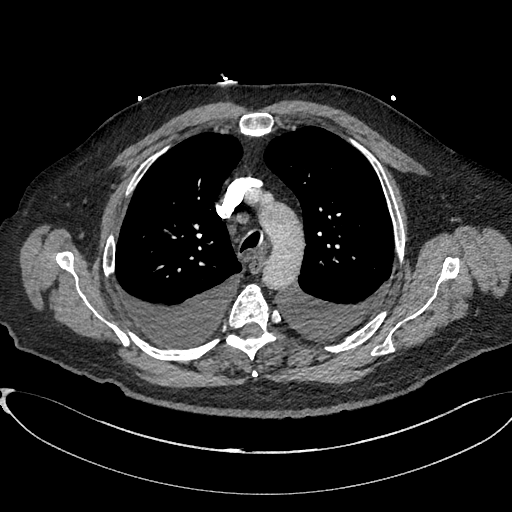
[im 359/462  lung]
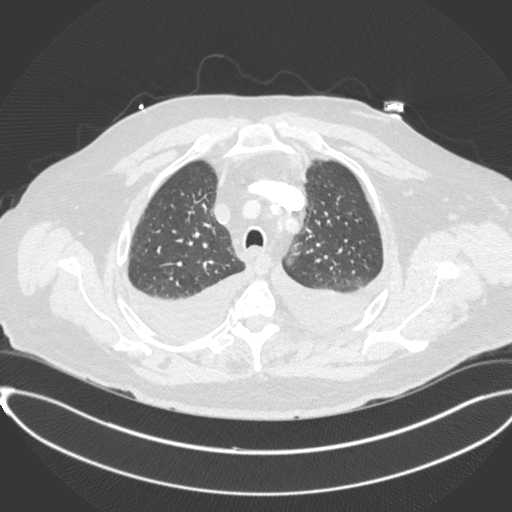
[im 410/462  soft-tissue]
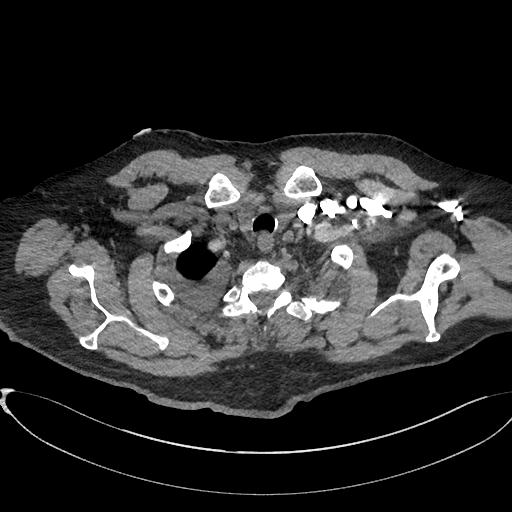
[im 436/462  lung]
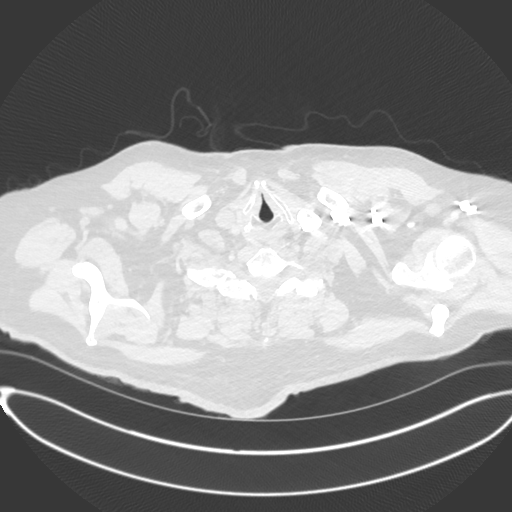

[Series 8: cor · coronal · 0.67mm/px · 3 of 164 slices shown]
[im 41/164  soft-tissue]
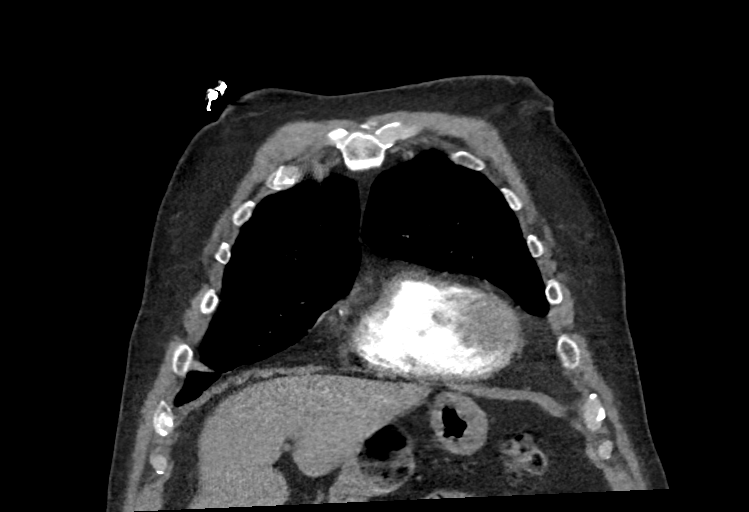
[im 82/164  soft-tissue]
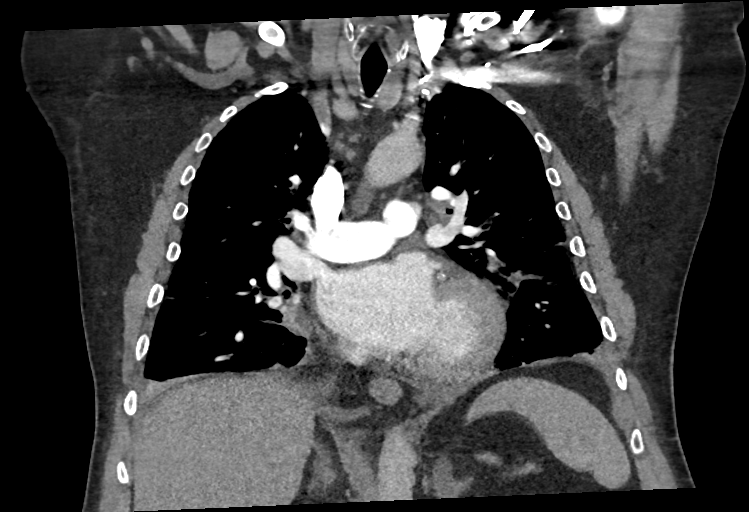
[im 123/164  soft-tissue]
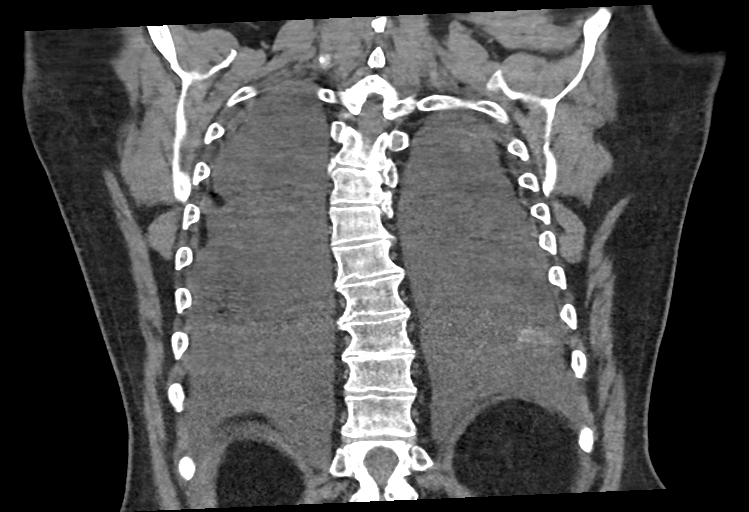

[16 of 46 positions shown; findings below may reference images not displayed]

FINDINGS: Cardiovascular: Satisfactory opacification the pulmonary arteries to
the segmental level. No pulmonary artery filling defects are
identified. Central pulmonary arteries are normal caliber. No
elevation of the RV/LV ratio (0.73). Mild cardiomegaly. Trace
pericardial fluid. Coronary artery calcifications are noted.
Thoracic aortic caliber is at the upper limits of normal. Minimal
atherosclerotic plaque in the aorta. The left vertebral artery
arises directly from the aortic arch. Minimal atherosclerotic plaque
in the great vessels.

Mediastinum/Nodes: Low-attenuation mediastinal and hilar nodes are
present. Several larger low-attenuation nodes include a 12 mm
subcarinal lymph node (5/73), a 11 mm left carinal lymph node (5/61)
and a 12 mm right carinal lymph node (5/70). There is a
heterogeneously enlarged left lobe thyroid gland. Discrete
nodularity difficult to ascertain. Bowing of the trachea compatible
with imaging during exhalation. Mild diffuse peribronchovascular
thickening is noted. No acute abnormality of the esophagus.

Lungs/Pleura: Moderate bilateral effusions with adjacent passive
atelectasis in the airless lung. Additional fluid seen tracking into
the fissures with adjacent passive atelectasis of the upper lobes as
well. Interlobular septal thickening is noted in the lung bases and
apices with some central peribronchovascular opacity. Interspersed
areas of ground-glass opacity are noted. No pneumothorax.

Upper Abdomen: Mild bilateral symmetric perinephric stranding, a
nonspecific finding though may correlate with either age or
decreased renal function. No acute abnormalities present in the
visualized portions of the upper abdomen.

Musculoskeletal: T11 anterior wedging is similar to comparison from
3648. Multilevel degenerative changes are present in the imaged
portions of the spine. No acute osseous abnormality or suspicious
osseous lesion. No suspicious chest wall lesions.

Review of the MIP images confirms the above findings.
IMPRESSION: 1. No evidence of pulmonary embolism.
2. Moderate bilateral pleural effusions with adjacent passive
atelectasis in the airless lung.
3. Interlobular septal thickening in the lung bases and apices with
some central peribronchovascular and ground-glass opacity, most
suggestive of pulmonary edema.
4. Low-attenuation mediastinal and hilar nodes, likely
reactive/edematous.
5. Heterogeneous, enlarged left lobe thyroid gland. Discrete
nodularity difficult to ascertain. Consider further evaluation with
thyroid ultrasound. This follows consensus guidelines: Managing
Incidental Thyroid Nodules Detected on Imaging: White Paper of [REDACTED]. [HOSPITAL] 9182;
[DATE]. and Awada 3-tiered system for managing ITNs: [HOSPITAL]. [DATE]): 143-50
6. Aortic Atherosclerosis (J2N3E-4PS.S).
7. Cardiomegaly.  Coronary atherosclerosis.

## 2021-09-14 IMAGING — US US THYROID
1 series · 13 of 25 positions shown · non-contrast
Comparison: CTA of the chest on 04/14/2019

CLINICAL DATA: Incidental on CT. Enlargement of left lobe of the
thyroid gland by CT.

EXAM:
THYROID ULTRASOUND
TECHNIQUE: Ultrasound examination of the thyroid gland and adjacent soft
tissues was performed.

[Series 1: us thyroid · 13 of 30 slices shown]
[im 1/30]
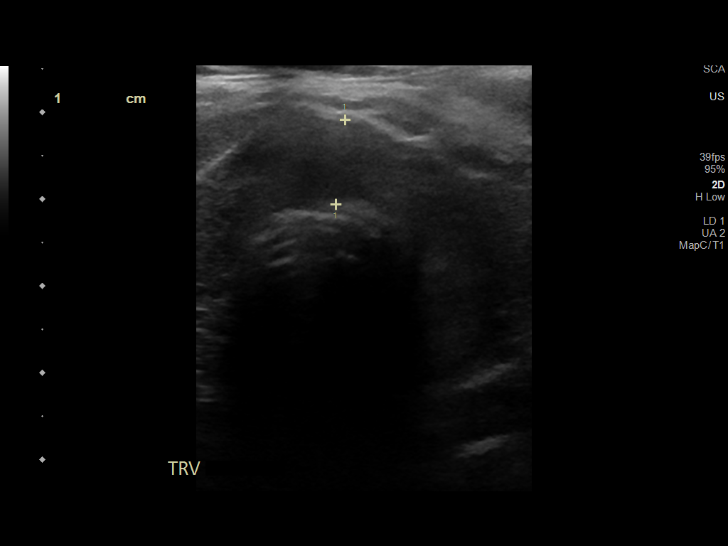
[im 3/30]
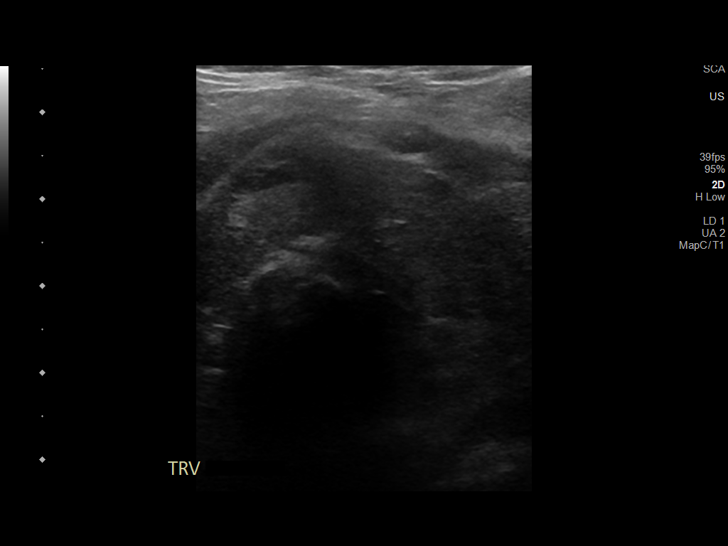
[im 5/30]
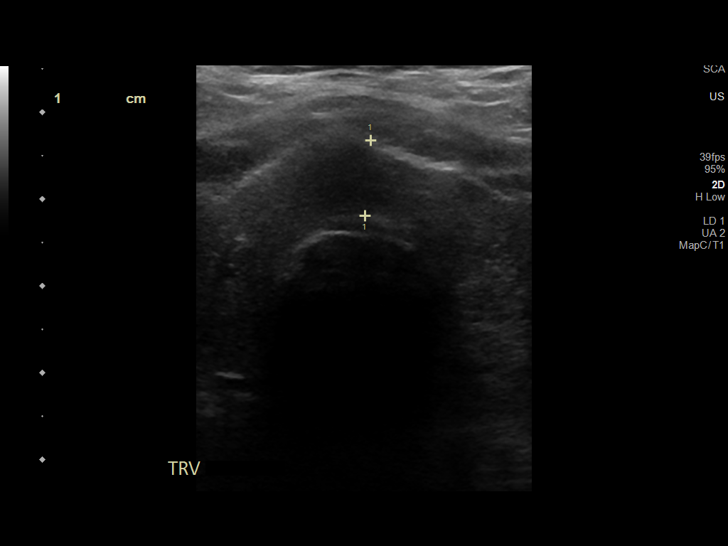
[im 8/30]
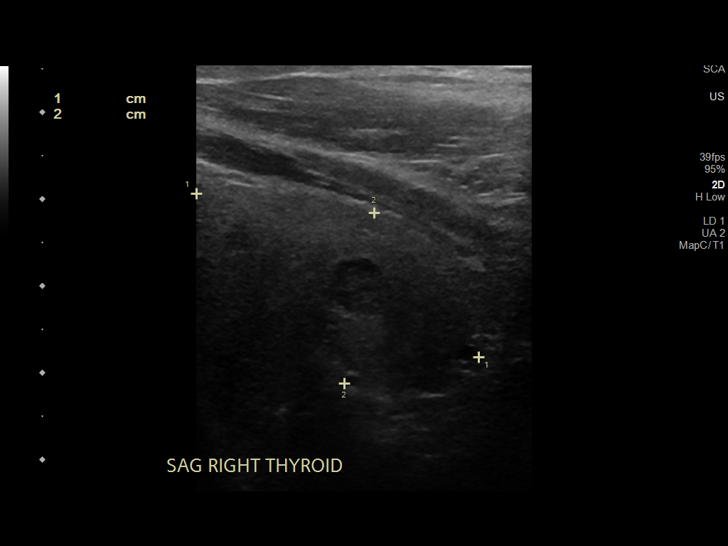
[im 10/30]
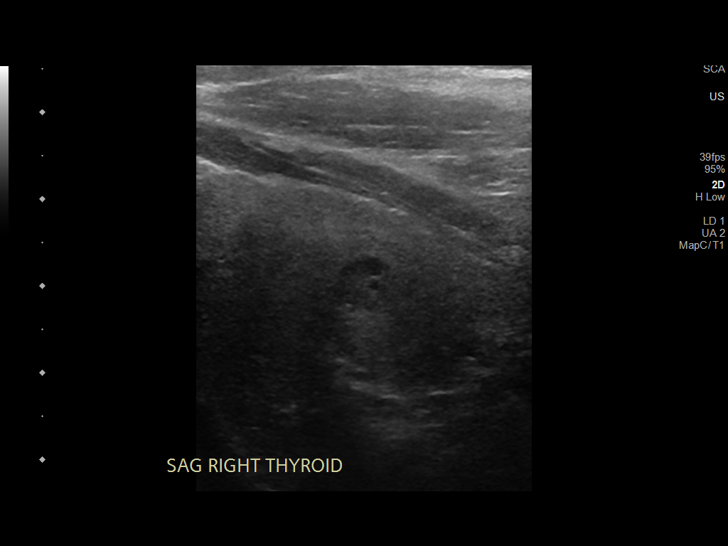
[im 13/30]
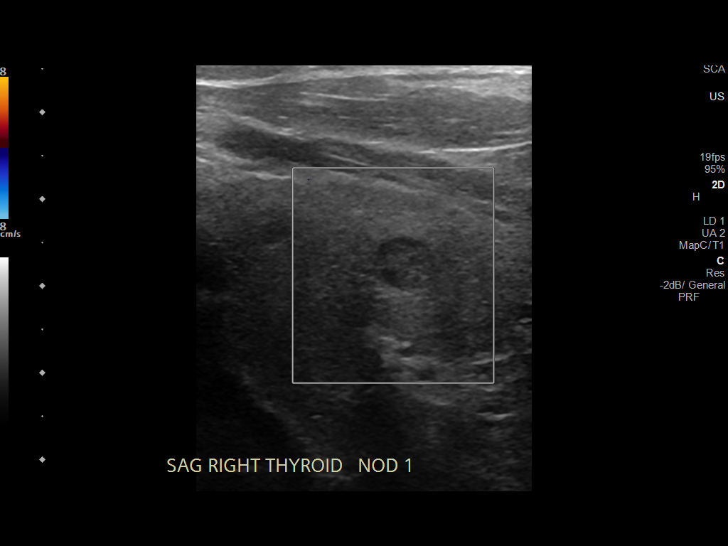
[im 15/30]
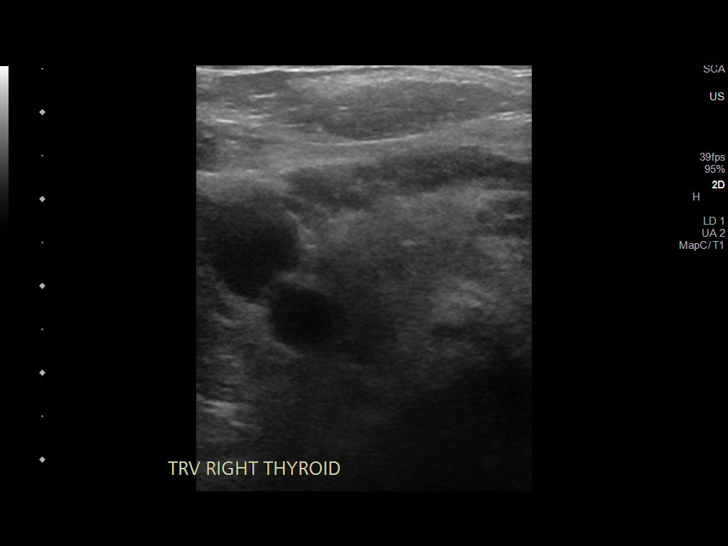
[im 17/30]
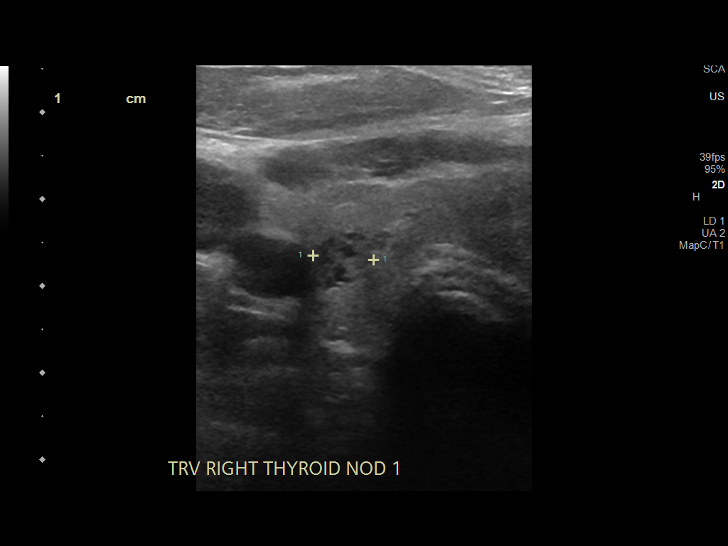
[im 20/30]
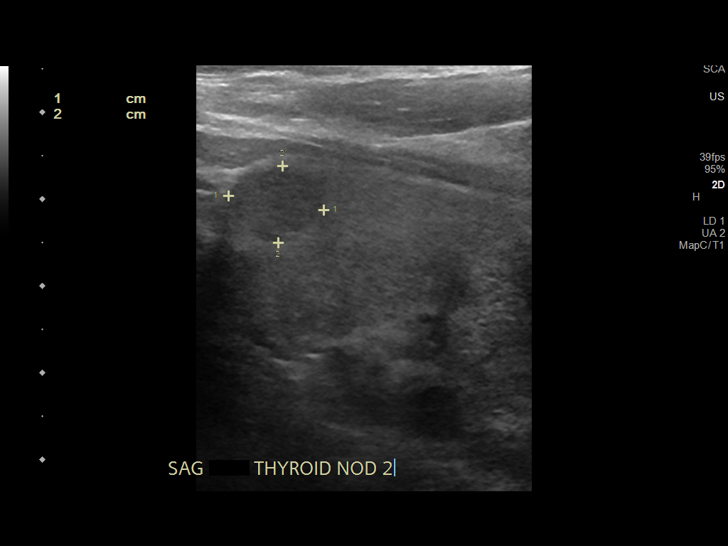
[im 22/30]
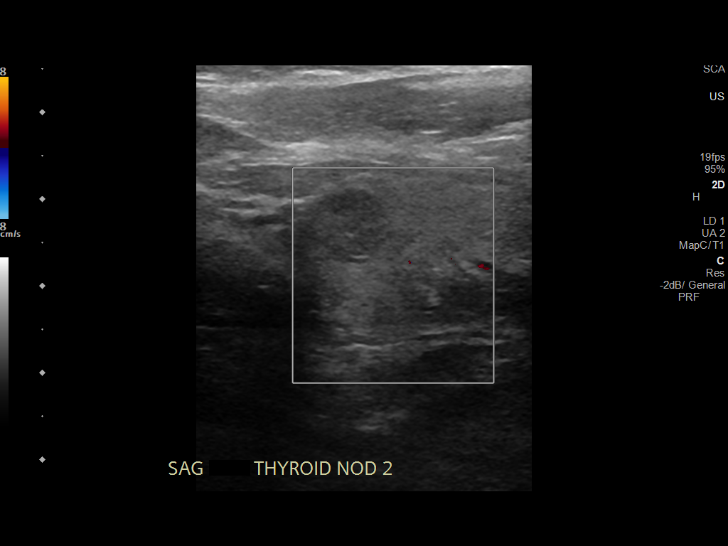
[im 25/30]
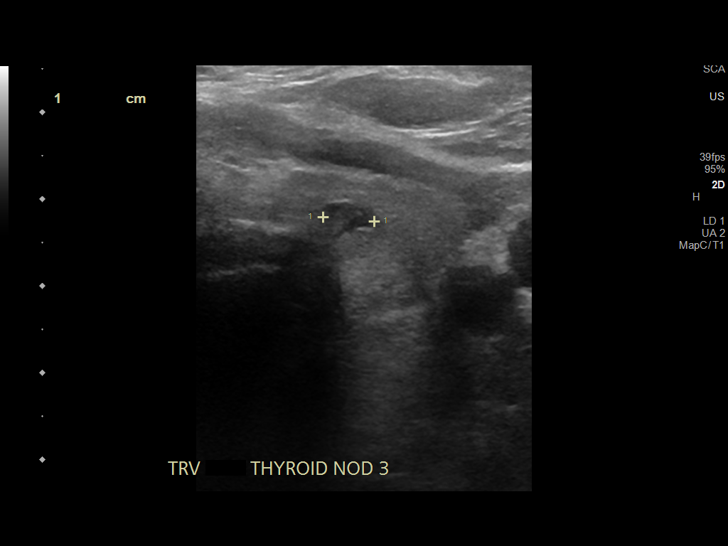
[im 27/30]
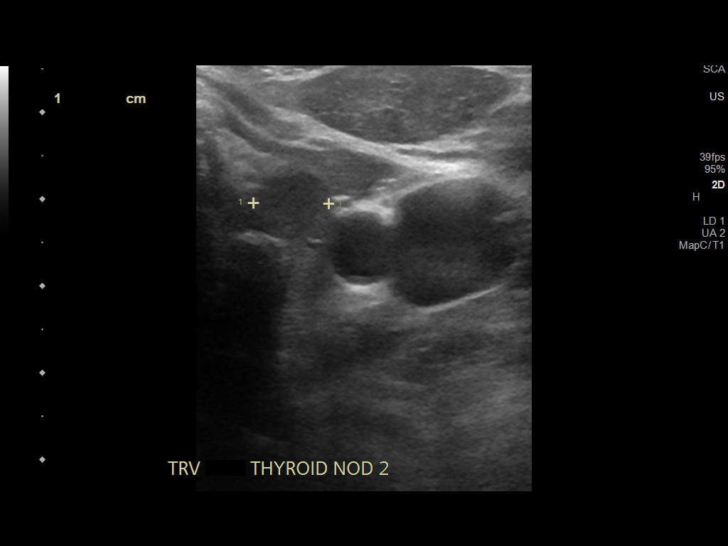
[im 30/30]
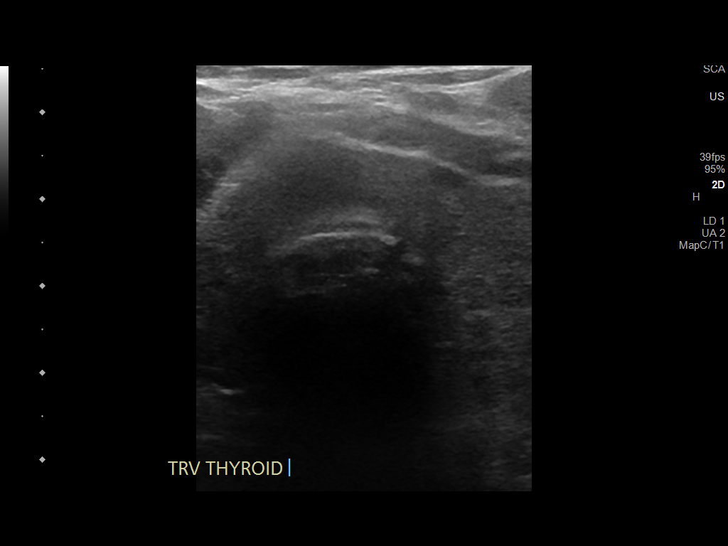

[13 of 25 positions shown; findings below may reference images not displayed]

FINDINGS: Parenchymal Echotexture: Mildly heterogenous

Isthmus: 0.9 cm

Right lobe: 3.8 x 2.0 x 1.4 cm

Left lobe: 3.1 x 1.9 x 1.7 cm (mediastinal extension of left thyroid
goiter likely not fully visualized by ultrasound)

_________________________________________________________

Estimated total number of nodules >/= 1 cm: 1

Number of spongiform nodules >/=  2 cm not described below (TR1): 0

Number of mixed cystic and solid nodules >/= 1.5 cm not described
below (TR2): 0

_________________________________________________________

Nodule # 1:

Location: Right; Mid

Maximum size: 0.7 cm; Other 2 dimensions: 0.7 x 0.7 cm

Composition: spongiform (0)

Echogenicity: anechoic (0)

Shape: not taller-than-wide (0)

Margins: smooth (0)

Echogenic foci: none (0)

ACR TI-RADS total points: 0.

ACR TI-RADS risk category: TR1 (0-1 points).

ACR TI-RADS recommendations:

This nodule does NOT meet TI-RADS criteria for biopsy or dedicated
follow-up.

_________________________________________________________

Nodule # 2:

Location: Left; Superior

Maximum size: 1.1 cm; Other 2 dimensions: 0.8 x 0.9 cm

Composition: solid/almost completely solid (2)

Echogenicity: hypoechoic (2)

Shape: not taller-than-wide (0)

Margins: smooth (0)

Echogenic foci: none (0)

ACR TI-RADS total points: 4.

ACR TI-RADS risk category: TR4 (4-6 points).

ACR TI-RADS recommendations:

*Given size (>/= 1 - 1.4 cm) and appearance, a follow-up ultrasound
in 1 year should be considered based on TI-RADS criteria.

_________________________________________________________

Nodule # 3:

Location: Left; Inferior

Maximum size: 0.6 cm; Other 2 dimensions: 0.6 x 0.6 cm

Composition: mixed cystic and solid (1)

Echogenicity: isoechoic (1)

Shape: not taller-than-wide (0)

Margins: ill-defined (0)

Echogenic foci: none (0)

ACR TI-RADS total points: 2.

ACR TI-RADS risk category: TR2 (2 points).

ACR TI-RADS recommendations:

This nodule does NOT meet TI-RADS criteria for biopsy or dedicated
follow-up.

_________________________________________________________

No abnormal lymph nodes identified.
IMPRESSION: 1.1 cm left superior thyroid nodule meets criteria for 1 year
follow-up ultrasound. 0.7 cm right mid and 0.6 cm left inferior
nodules do not meet criteria for biopsy or further follow-up.

The above is in keeping with the ACR TI-RADS recommendations - [HOSPITAL] 6942;[DATE].

## 2021-09-18 NOTE — Telephone Encounter (Signed)
Will send to Drue Novel, [facility] Supervisor to please assist in the process of sending to billing dept. We have been unable to contact the pt with multiple attempts.  ?

## 2021-09-25 NOTE — Telephone Encounter (Signed)
Turned in to billing team to complete necessary action. ?

## 2021-09-26 ENCOUNTER — Other Ambulatory Visit: Payer: Self-pay

## 2021-10-01 DIAGNOSIS — I420 Dilated cardiomyopathy: Secondary | ICD-10-CM

## 2021-10-02 ENCOUNTER — Other Ambulatory Visit: Payer: Self-pay | Admitting: Nurse Practitioner

## 2021-10-02 DIAGNOSIS — I5022 Chronic systolic (congestive) heart failure: Secondary | ICD-10-CM

## 2021-10-02 DIAGNOSIS — E1165 Type 2 diabetes mellitus with hyperglycemia: Secondary | ICD-10-CM

## 2021-10-03 DIAGNOSIS — M25511 Pain in right shoulder: Secondary | ICD-10-CM | POA: Diagnosis not present

## 2021-10-16 ENCOUNTER — Other Ambulatory Visit: Payer: Self-pay | Admitting: Nurse Practitioner

## 2021-10-17 ENCOUNTER — Other Ambulatory Visit: Payer: Medicare Other

## 2021-10-22 ENCOUNTER — Encounter: Payer: Self-pay | Admitting: Nurse Practitioner

## 2021-10-22 ENCOUNTER — Ambulatory Visit (INDEPENDENT_AMBULATORY_CARE_PROVIDER_SITE_OTHER): Payer: Medicare Other | Admitting: Nurse Practitioner

## 2021-10-22 VITALS — BP 118/70 | HR 80 | Temp 97.5°F | Ht 71.0 in | Wt 248.0 lb

## 2021-10-22 DIAGNOSIS — F17200 Nicotine dependence, unspecified, uncomplicated: Secondary | ICD-10-CM

## 2021-10-22 DIAGNOSIS — Z6833 Body mass index (BMI) 33.0-33.9, adult: Secondary | ICD-10-CM

## 2021-10-22 DIAGNOSIS — I10 Essential (primary) hypertension: Secondary | ICD-10-CM | POA: Diagnosis not present

## 2021-10-22 DIAGNOSIS — E1165 Type 2 diabetes mellitus with hyperglycemia: Secondary | ICD-10-CM | POA: Diagnosis not present

## 2021-10-22 DIAGNOSIS — E782 Mixed hyperlipidemia: Secondary | ICD-10-CM

## 2021-10-22 DIAGNOSIS — J449 Chronic obstructive pulmonary disease, unspecified: Secondary | ICD-10-CM

## 2021-10-22 DIAGNOSIS — I5022 Chronic systolic (congestive) heart failure: Secondary | ICD-10-CM | POA: Diagnosis not present

## 2021-10-22 DIAGNOSIS — E6609 Other obesity due to excess calories: Secondary | ICD-10-CM

## 2021-10-22 NOTE — Patient Instructions (Signed)
To get shingles and tdap vaccine at local pharmacy

## 2021-10-22 NOTE — Progress Notes (Signed)
Careteam: Patient Care Team: Lauree Chandler, NP as PCP - General (Geriatric Medicine) Sueanne Margarita, MD as PCP - Cardiology (Cardiology) Melissa Montane, MD as Consulting Physician (Otolaryngology) [name], MD as Consulting Physician (Urology) Warden Fillers, MD as Consulting Physician (Ophthalmology)  PLACE OF SERVICE:  [place] Directive information Does Patient Have a Medical Advance Directive?: No, Would patient like information on creating a medical advance directive?: No - Patient declined  Allergies  Allergen Reactions   Chantix [Varenicline Tartrate]     Aggression     Chief Complaint  Patient presents with   Medical Management of Chronic Issues    Routine visit, not fasting today. NC[facility] verified. Discuss need for td/tdap, shingrix, covid booster, and eye exam. Patient denies receiving any vaccines since last visit. Wife manages medications.      HPI: Patient is a 71 y.o. male for routine follow up.   Followed by cardiology for heart failure. No worsening chest pains or shortness of breath. Continues to smoke.   Reports he is smoking more- increase stress. 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   Has been followed by orthopedic- had rotator cuff surgery to left shoulder. Doing well at this time.   DM- no hypoglycemia, recently on vacation and had poor diet.   They are in the process of moving which is stressful. Does not want to do cologuard at this time.   Review of Systems:  Review of Systems  Constitutional:  Negative for chills, fever and weight loss.  HENT:  Negative for tinnitus.   Respiratory:  Positive for cough and wheezing. Negative for sputum production and shortness of breath.   Cardiovascular:  Negative for chest pain, palpitations and leg swelling.  Gastrointestinal:  Negative for abdominal pain, constipation, diarrhea and heartburn.  Genitourinary:  Negative for dysuria, frequency and  urgency.  Musculoskeletal:  Positive for joint pain. Negative for back pain, falls and myalgias.  Skin: Negative.   Neurological:  Negative for dizziness and headaches.  Psychiatric/Behavioral:  Negative for depression and memory loss. The patient does not have insomnia.    Past Medical History:  Diagnosis Date   Arthritis    Bladder cancer [facility]) urologist-  dr Kipp Brood   Borderline diabetes    Bursitis of both shoulders    CAD (coronary artery disease), native coronary artery    Cath 03/2019 with 20% prox and 60% mid LAD stenosis >>medical management recommended   Chronic systolic CHF (congestive heart failure), NYHA class 2 (HCC)    EF 29% by cMRI>>improved to 50-55% by echo 02/2021   Diabetes mellitus without complication ([place])    Disorder of male genital organs, unspecified    Dyspnea    Esophagus disorder    Constricted Esophagus, per New Patient Packet-PSC    History of basal cell carcinoma (BCC) excision    05/ 2018  right forehead   HLD (hyperlipidemia)    Hypertension    Mild acid reflux no meds   Nocturia    OSA (obstructive sleep apnea)    per pt has not used cpap in years   Renal cyst, left 11/2011   16 mm   Sleep apnea    Per New Patient Packet-PSC    Smokers' cough ([place])    SOB (shortness of breath) on exertion    Past Surgical History:  Procedure Laterality Date   CYSTOSCOPY WITH BIOPSY  03/13/2012   Procedure: CYSTOSCOPY WITH BIOPSY;  Surgeon: Fredricka Bonine, MD;  Location: [facility];  Service: Urology;  Laterality: N/A;      ESOPHAGEAL DILATION  05/20/1986   Esophagus Stretch, per New Patient Packet-PSC    RIGHT/LEFT HEART CATH AND CORONARY ANGIOGRAPHY N/A 04/16/2019   Procedure: RIGHT/LEFT HEART CATH AND CORONARY ANGIOGRAPHY;  Surgeon: Troy Sine, MD;  Location: [facility];  Service: Cardiovascular;  Laterality: N/A;   SHOULDER ARTHROSCOPY WITH ROTATOR CUFF REPAIR Left 03/15/2021   Procedure: Left shoulder  arthroscopy, debridement, subacromial decompression, distal clavicle resection, possible rotator cuff repair;  Surgeon: Justice Britain, MD;  Location: [facility];  Service: Orthopedics;  Laterality: Left;   TONSILLECTOMY  age 82   TRANSURETHRAL RESECTION OF BLADDER TUMOR N/A 05/23/2017   Procedure: TRANSURETHRAL RESECTION OF BLADDER TUMOR / (TURBT) WITH INSTILLATION OF EPIRUBICIN;  Surgeon: [name], MD;  Location: [facility];  Service: Urology;  Laterality: N/A;   UVULECTOMY  05/20/1990   Dr.Byers, per new patient packet    UVULOPALATOPHARYNGOPLASTY  2005   osa   Social History:   reports that he has been smoking cigarettes. He has a 23.50 pack-year smoking history. He quit smokeless tobacco use about 53 years ago.  His smokeless tobacco use included chew. He reports current alcohol use. He reports that he does not currently use drugs.  Family History  Problem Relation Age of Onset   Diabetes Mother    Dementia Mother    Stroke Mother    Arthritis Mother    Hypertension Mother    Multiple sclerosis Father    Diabetes Father    Diabetes Sister    Hypertension Sister    ADD / ADHD Son    Migraines Daughter    ADD / ADHD Daughter    Cancer Maternal Grandmother     Medications: Patient's Medications  New Prescriptions   No medications on file  Previous Medications   ACCU-CHEK GUIDE TEST STRIP    USE TO TEST BLOOD SUGAR DAILY.   ACCU-CHEK SOFTCLIX LANCETS LANCETS    USE TO TEST BLOOD SUGAR DAILY.   ACETAMINOPHEN (TYLENOL) 500 MG TABLET    Take 2 tablets (1,000 mg total) by mouth every 8 (eight) hours as needed for moderate pain.   ALBUTEROL (VENTOLIN HFA) 108 (90 BASE) MCG/ACT INHALER    INHALE 2 PUFFS INTO THE LUNGS EVERY 6 HOURS AS NEEDED FOR WHEEZING OR SHORTNESS OF BREATH.   ASPIRIN 81 MG CHEWABLE TABLET    Chew 1 tablet (81 mg total) by mouth daily.   ATORVASTATIN (LIPITOR) 80 MG TABLET    TAKE ONE TABLET BY MOUTH ONCE DAILY AT 6PM.   CARISOPRODOL (SOMA) 350  MG TABLET    Take 1 tablet (350 mg total) by mouth at bedtime as needed for muscle spasms.   DAPAGLIFLOZIN PROPANEDIOL (FARXIGA) 10 MG TABS TABLET    Take 1 tablet (10 mg total) by mouth daily before breakfast.   DULAGLUTIDE (TRULICITY) 8.34 HD/6.2IW SOPN    INJECT 1 SYRINGE (50 MG) SUBCUTANEOUSLY ONCE A WEEK (ON THE SAME DAY OF EACH WEEK) AS DIRECTED.   FUROSEMIDE (LASIX) 20 MG TABLET    Take 1 tablet (20 mg total) by mouth daily.   GLIPIZIDE (GLUCOTROL) 5 MG TABLET    TAKE (1) TABLET BY MOUTH TWICE DAILY.   GLUCOSAMINE-CHONDROITIN-MSM (GLUCOS-CHOND-MSM-DOUBLE STR PO)    Take 1 tablet by mouth daily.   LEVOCETIRIZINE (XYZAL) 5 MG TABLET    Take 5 mg by mouth daily as needed for allergies.   METFORMIN (GLUCOPHAGE)  1000 MG TABLET    TAKE (1) TABLET BY MOUTH TWICE A DAY WITH MEALS (BREAKFAST AND SUPPER).   METOPROLOL SUCCINATE (TOPROL XL) 100 MG 24 HR TABLET    Take 1 tablet (100 mg total) by mouth daily. Take with or immediately following a meal.   METOPROLOL SUCCINATE (TOPROL XL) 25 MG 24 HR TABLET    Take 1 tablet (25 mg total) by mouth daily.   MULTIPLE VITAMIN (MULTIVITAMIN WITH MINERALS) TABS TABLET    Take 1 tablet by mouth daily.   OXYCODONE-ACETAMINOPHEN (PERCOCET) 5-325 MG TABLET    Take 1 tablet by mouth every 4 (four) hours as needed (max 6 q).   POTASSIUM CHLORIDE SA (KLOR-CON M) 20 MEQ TABLET    Take 2 tablets (40 mEq total) by mouth daily.   SACUBITRIL-VALSARTAN (ENTRESTO) 97-103 MG    Take 1 tablet by mouth 2 (two) times daily.   SPIRONOLACTONE (ALDACTONE) 25 MG TABLET    TAKE (1) TABLET BY MOUTH ONCE DAILY.   UMECLIDINIUM-VILANTEROL (ANORO ELLIPTA) 62.5-25 MCG/ACT AEPB    INHALE 1 PUFF INTO THE LUNGS ONCE DAILY.  Modified Medications   No medications on file  Discontinued Medications   No medications on file    Physical Exam:  Vitals:   10/22/21 0943  BP: 118/70  Pulse: 80  Temp: (!) 97.5 F (36.4 C)  TempSrc: Temporal  SpO2: 96%  Weight: 248 lb (112.5 kg)  Height: 5'  11" (1.803 m)   Body mass index is 34.59 kg/m. Wt Readings from Last 3 Encounters:  10/22/21 248 lb (112.5 kg)  08/08/21 240 lb (108.9 kg)  06/18/21 248 lb (112.5 kg)    Physical Exam Constitutional:      General: He is not in acute distress.    Appearance: He is well-developed. He is not diaphoretic.  HENT:     Head: Normocephalic and atraumatic.     Right Ear: External ear normal.     Left Ear: External ear normal.     Mouth/Throat:     Pharynx: No oropharyngeal exudate.  Eyes:     Conjunctiva/sclera: Conjunctivae normal.     Pupils: Pupils are equal, round, and reactive to light.  Cardiovascular:     Rate and Rhythm: Normal rate and regular rhythm.     Heart sounds: Normal heart sounds.  Pulmonary:     Effort: Pulmonary effort is normal.     Breath sounds: Wheezes: throughtout.  Abdominal:     General: Bowel sounds are normal.     Palpations: Abdomen is soft.  Musculoskeletal:        General: No tenderness.     Cervical back: Normal range of motion and neck supple.     Right lower leg: No edema.     Left lower leg: No edema.  Skin:    General: Skin is warm and dry.  Neurological:     Mental Status: He is alert and oriented to person, place, and time.    Labs reviewed: Basic Metabolic Panel: Recent Labs    02/08/21 0810 02/12/21 1033 03/01/21 1215  NA 138 139 139  K 4.5 4.4 4.2  CL 103 98 103  CO2 _0 GLUCOSE 176* 200* 149*  BUN _1 CREATININE 0.97 0.93 0.87  CALCIUM 10.2 10.2 10.4*   Liver Function Tests: Recent Labs    02/08/21 0810 02/12/21 1033  AST 14 19  ALT 28 26  ALKPHOS  --  57  BILITOT 1.2 1.0  PROT 6.4 6.6  ALBUMIN  --  4.8   No results for input(s): LIPASE, AMYLASE in the last 8760 hours. No results for input(s): AMMONIA in the last 8760 hours. CBC: Recent Labs    02/08/21 0810 03/01/21 1215  WBC 8.7 10.5  NEUTROABS 5,533  --   HGB 15.9 16.4  HCT 47.0 48.5  MCV 91.4 91.2  PLT 304 272   Lipid Panel: Recent  Labs    02/08/21 0810 02/12/21 1033  CHOL 102 91*  HDL 39* 39*  LDLCALC 37 30  TRIG 179* 121  CHOLHDL 2.6 2.3   TSH: No results for input(s): TSH in the last 8760 hours. A1C: Lab Results  Component Value Date   HGBA1C 6.7 (H) 06/13/2021     Assessment/Plan 1. Type 2 diabetes mellitus with hyperglycemia, without long-term current use of insulin (HCC) --Encouraged dietary compliance, routine foot care/monitoring and to keep up with diabetic eye exams through ophthalmology  -continues on trulicity, glipizide, metformin and trulicity  - Hemoglobin A1c  2. Chronic systolic CHF (congestive heart failure), NYHA class 2 (HCC) -euvolemic at this time. Stable on entresto aldactone, lasix  3. Chronic obstructive pulmonary disease, unspecified COPD type ([place]) Ongoing and stable. No worsening of shortness of breath or wheezing. Continues on anoro.  4. Smoker -ongoing, encourage cessation however recently with increase in amount smoked   5. Essential hypertension Blood pressure well controlled Continue current medications Recheck metabolic panel - CBC with Differential/Platelet - CMP with eGFR([facility])  6. Mixed hyperlipidemia -continues on lipitor, last lipids at goal   7. Class 1 obesity due to excess calories with serious comorbidity and body mass index (BMI) of 33.0 to 33.9 in adult -education provided on healthy weight loss through increase in physical activity and proper nutrition    Return in about 4 months (around 02/21/2022) for routine follow up. [employer]. [place], [facility] [phone]

## 2021-10-23 LAB — CBC WITH DIFFERENTIAL/PLATELET
Absolute Monocytes: 586 cells/uL (ref 200–950)
Basophils Absolute: 20 cells/uL (ref 0–200)
Basophils Relative: 0.2 %
Eosinophils Absolute: 515 cells/uL — ABNORMAL HIGH (ref 15–500)
Eosinophils Relative: 5.1 %
HCT: 49.8 % (ref 38.5–50.0)
Hemoglobin: 16.7 g/dL (ref 13.2–17.1)
Lymphs Abs: 1566 cells/uL (ref 850–3900)
MCH: 30.8 pg (ref 27.0–33.0)
MCHC: 33.5 g/dL (ref 32.0–36.0)
MCV: 91.9 fL (ref 80.0–100.0)
MPV: 10.4 fL (ref 7.5–12.5)
Monocytes Relative: 5.8 %
Neutro Abs: 7413 cells/uL (ref 1500–7800)
Neutrophils Relative %: 73.4 %
Platelets: 266 10*3/uL (ref 140–400)
RBC: 5.42 10*6/uL (ref 4.20–5.80)
RDW: 12.7 % (ref 11.0–15.0)
Total Lymphocyte: 15.5 %
WBC: 10.1 10*3/uL (ref 3.8–10.8)

## 2021-10-23 LAB — COMPLETE METABOLIC PANEL WITH GFR
AG Ratio: 2.4 (calc) (ref 1.0–2.5)
ALT: 21 U/L (ref 9–46)
AST: 16 U/L (ref 10–35)
Albumin: 4.5 g/dL (ref 3.6–5.1)
Alkaline phosphatase (APISO): 51 U/L (ref 35–144)
BUN: 14 mg/dL (ref 7–25)
CO2: 26 mmol/L (ref 20–32)
Calcium: 10 mg/dL (ref 8.6–10.3)
Chloride: 103 mmol/L (ref 98–110)
Creat: 1.02 mg/dL (ref 0.70–1.28)
Globulin: 1.9 g/dL (calc) (ref 1.9–3.7)
Glucose, Bld: 152 mg/dL — ABNORMAL HIGH (ref 65–139)
Potassium: 4.5 mmol/L (ref 3.5–5.3)
Sodium: 138 mmol/L (ref 135–146)
Total Bilirubin: 1.3 mg/dL — ABNORMAL HIGH (ref 0.2–1.2)
Total Protein: 6.4 g/dL (ref 6.1–8.1)
eGFR: 79 mL/min/{1.73_m2} (ref >=60)

## 2021-10-23 LAB — HEMOGLOBIN A1C
Hgb A1c MFr Bld: 6.4 % of total Hgb — ABNORMAL HIGH (ref <5.7)
Mean Plasma Glucose: 137 mg/dL
eAG (mmol/L): 7.6 mmol/L

## 2022-02-07 ENCOUNTER — Encounter: Payer: Self-pay | Admitting: Nurse Practitioner

## 2022-02-07 ENCOUNTER — Telehealth: Payer: Self-pay

## 2022-02-07 ENCOUNTER — Ambulatory Visit (INDEPENDENT_AMBULATORY_CARE_PROVIDER_SITE_OTHER): Payer: Medicare Other | Admitting: Nurse Practitioner

## 2022-02-07 DIAGNOSIS — Z0000 Encounter for general adult medical examination without abnormal findings: Secondary | ICD-10-CM | POA: Diagnosis not present

## 2022-02-07 DIAGNOSIS — Z1211 Encounter for screening for malignant neoplasm of colon: Secondary | ICD-10-CM | POA: Diagnosis not present

## 2022-02-07 DIAGNOSIS — Z1212 Encounter for screening for malignant neoplasm of rectum: Secondary | ICD-10-CM | POA: Diagnosis not present

## 2022-02-07 DIAGNOSIS — F17200 Nicotine dependence, unspecified, uncomplicated: Secondary | ICD-10-CM

## 2022-02-07 NOTE — Patient Instructions (Signed)
[name] , Thank you for taking time to come for your Medicare Wellness Visit. I appreciate your ongoing commitment to your health goals. Please review the following plan we discussed and let me know if I can assist you in the future.   Screening recommendations/referrals: Colonoscopy -cologuard order placed- please complete once you get Recommended yearly ophthalmology/optometry visit for glaucoma screening and checkup Recommended yearly dental visit for hygiene and checkup  Vaccinations: Influenza vaccine due annually in September/October Pneumococcal vaccine up to date Tdap vaccine DUE- recommend to get at your local pharmacy Shingles vaccine DUE- recommend to get at your local pharmacy    Advanced directives: recommended to complete.   Conditions/risks identified: diabetes, advanced age, hyperlipidemia, heart failure  Next appointment: yearly  Preventive Care 70 Years and Older, Male Preventive care refers to lifestyle choices and visits with your health care provider that can promote health and wellness. What does preventive care include? A yearly physical exam. This is also called an annual well check. Dental exams once or twice a year. Routine eye exams. Ask your health care provider how often you should have your eyes checked. Personal lifestyle choices, including: Daily care of your teeth and gums. Regular physical activity. Eating a healthy diet. Avoiding tobacco and drug use. Limiting alcohol use. Practicing safe sex. Taking low doses of aspirin every day. Taking vitamin and mineral supplements as recommended by your health care provider. What happens during an annual well check? The services and screenings done by your health care provider during your annual well check will depend on your age, overall health, lifestyle risk factors, and family history of disease. Counseling  Your health care provider may ask you questions about your: Alcohol use. Tobacco  use. Drug use. Emotional well-being. Home and relationship well-being. Sexual activity. Eating habits. History of falls. Memory and ability to understand (cognition). Work and work Statistician. Screening  You may have the following tests or measurements: Height, weight, and BMI. Blood pressure. Lipid and cholesterol levels. These may be checked every 5 years, or more frequently if you are over 64 years old. Skin check. Lung cancer screening. You may have this screening every year starting at age 72 if you have a 30-pack-year history of smoking and currently smoke or have quit within the past 15 years. Fecal occult blood test (FOBT) of the stool. You may have this test every year starting at age 36. Flexible sigmoidoscopy or colonoscopy. You may have a sigmoidoscopy every 5 years or a colonoscopy every 10 years starting at age 65. Prostate cancer screening. Recommendations will vary depending on your family history and other risks. Hepatitis C blood test. Hepatitis B blood test. Sexually transmitted disease (STD) testing. Diabetes screening. This is done by checking your blood sugar (glucose) after you have not eaten for a while (fasting). You may have this done every 1-3 years. Abdominal aortic aneurysm (AAA) screening. You may need this if you are a current or former smoker. Osteoporosis. You may be screened starting at age 69 if you are at high risk. Talk with your health care provider about your test results, treatment options, and if necessary, the need for more tests. Vaccines  Your health care provider may recommend certain vaccines, such as: Influenza vaccine. This is recommended every year. Tetanus, diphtheria, and acellular pertussis (Tdap, Td) vaccine. You may need a Td booster every 10 years. Zoster vaccine. You may need this after age 45. Pneumococcal 13-valent conjugate (PCV13) vaccine. One dose is recommended after age 60. Pneumococcal  polysaccharide (PPSV23) vaccine.  One dose is recommended after age 25. Talk to your health care provider about which screenings and vaccines you need and how often you need them. This information is not intended to replace advice given to you by your health care provider. Make sure you discuss any questions you have with your health care provider. Document Released: 06/02/2015 Document Revised: 01/24/2016 Document Reviewed: 03/07/2015 Elsevier Interactive Patient Education  2017 [place] Prevention in the Home Falls can cause injuries. They can happen to people of all ages. There are many things you can do to make your home safe and to help prevent falls. What can I do on the outside of my home? Regularly fix the edges of walkways and driveways and fix any cracks. Remove anything that might make you trip as you walk through a door, such as a raised step or threshold. Trim any bushes or trees on the path to your home. Use bright outdoor lighting. Clear any walking paths of anything that might make someone trip, such as rocks or tools. Regularly check to see if handrails are loose or broken. Make sure that both sides of any steps have handrails. Any raised decks and porches should have guardrails on the edges. Have any leaves, snow, or ice cleared regularly. Use sand or salt on walking paths during winter. Clean up any spills in your garage right away. This includes oil or grease spills. What can I do in the bathroom? Use night lights. Install grab bars by the toilet and in the tub and shower. Do not use towel bars as grab bars. Use non-skid mats or decals in the tub or shower. If you need to sit down in the shower, use a plastic, non-slip stool. Keep the floor dry. Clean up any water that spills on the floor as soon as it happens. Remove soap buildup in the tub or shower regularly. Attach bath mats securely with double-sided non-slip rug tape. Do not have throw rugs and other things on the floor that can make  you trip. What can I do in the bedroom? Use night lights. Make sure that you have a light by your bed that is easy to reach. Do not use any sheets or blankets that are too big for your bed. They should not hang down onto the floor. Have a firm chair that has side arms. You can use this for support while you get dressed. Do not have throw rugs and other things on the floor that can make you trip. What can I do in the kitchen? Clean up any spills right away. Avoid walking on wet floors. Keep items that you use a lot in easy-to-reach places. If you need to reach something above you, use a strong step stool that has a grab bar. Keep electrical cords out of the way. Do not use floor polish or wax that makes floors slippery. If you must use wax, use non-skid floor wax. Do not have throw rugs and other things on the floor that can make you trip. What can I do with my stairs? Do not leave any items on the stairs. Make sure that there are handrails on both sides of the stairs and use them. Fix handrails that are broken or loose. Make sure that handrails are as long as the stairways. Check any carpeting to make sure that it is firmly attached to the stairs. Fix any carpet that is loose or worn. Avoid having throw rugs at the top  or bottom of the stairs. If you do have throw rugs, attach them to the floor with carpet tape. Make sure that you have a light switch at the top of the stairs and the bottom of the stairs. If you do not have them, ask someone to add them for you. What else can I do to help prevent falls? Wear shoes that: Do not have high heels. Have rubber bottoms. Are comfortable and fit you well. Are closed at the toe. Do not wear sandals. If you use a stepladder: Make sure that it is fully opened. Do not climb a closed stepladder. Make sure that both sides of the stepladder are locked into place. Ask someone to hold it for you, if possible. Clearly mark and make sure that you can  see: Any grab bars or handrails. First and last steps. Where the edge of each step is. Use tools that help you move around (mobility aids) if they are needed. These include: Canes. Walkers. Scooters. Crutches. Turn on the lights when you go into a dark area. Replace any light bulbs as soon as they burn out. Set up your furniture so you have a clear path. Avoid moving your furniture around. If any of your floors are uneven, fix them. If there are any pets around you, be aware of where they are. Review your medicines with your doctor. Some medicines can make you feel dizzy. This can increase your chance of falling. Ask your doctor what other things that you can do to help prevent falls. This information is not intended to replace advice given to you by your health care provider. Make sure you discuss any questions you have with your health care provider. Document Released: 03/02/2009 Document Revised: 10/12/2015 Document Reviewed: 06/10/2014 Elsevier Interactive Patient Education  2017 [employer].

## 2022-02-07 NOTE — Telephone Encounter (Signed)
[name], krutz are scheduled for a virtual visit with your provider today.    Just as we do with appointments in the office, we must obtain your consent to participate.  Your consent will be active for this visit and any virtual visit you may have with one of our providers in the next 365 days.    If you have a MyChart account, I can also send a copy of this consent to you electronically.  All virtual visits are billed to your insurance company just like a traditional visit in the office.  As this is a virtual visit, video technology does not allow for your provider to perform a traditional examination.  This may limit your provider's ability to fully assess your condition.  If your provider identifies any concerns that need to be evaluated in person or the need to arrange testing such as labs, EKG, etc, we will make arrangements to do so.    Although advances in technology are sophisticated, we cannot ensure that it will always work on either your end or our end.  If the connection with a video visit is poor, we may have to switch to a telephone visit.  With either a video or telephone visit, we are not always able to ensure that we have a secure connection.   I need to obtain your verbal consent now.   Are you willing to proceed with your visit today?   Timothy Lee has provided verbal consent on 02/07/2022 for a virtual visit (video or telephone).   Carroll Kinds, CMA 02/07/2022  9:01 AM

## 2022-02-07 NOTE — Progress Notes (Signed)
Subjective:   Timothy Lee is a 71 y.o. male who presents for Medicare Annual/Subsequent preventive examination.  Review of Systems           Objective:    There were no vitals filed for this visit. There is no height or weight on file to calculate BMI.     02/07/2022    8:57 AM 10/22/2021    9:41 AM 06/18/2021    9:53 AM 03/01/2021   11:43 AM 02/07/2021   11:02 AM 01/30/2021    8:31 AM 09/29/2020    9:37 AM  Advanced Directives  Does Patient Have a Medical Advance Directive? No No No No No No No  Would patient like information on creating a medical advance directive?  No - Patient declined No - Patient declined  No - Patient declined  No - Patient declined    Current Medications (verified) Outpatient Encounter Medications as of 02/07/2022  Medication Sig   ACCU-CHEK GUIDE test strip USE TO TEST BLOOD SUGAR DAILY.   Accu-Chek Softclix Lancets lancets USE TO TEST BLOOD SUGAR DAILY.   acetaminophen (TYLENOL) 500 MG tablet Take 2 tablets (1,000 mg total) by mouth every 8 (eight) hours as needed for moderate pain.   albuterol (VENTOLIN HFA) 108 (90 Base) MCG/ACT inhaler INHALE 2 PUFFS INTO THE LUNGS EVERY 6 HOURS AS NEEDED FOR WHEEZING OR SHORTNESS OF BREATH.   aspirin 81 MG chewable tablet Chew 1 tablet (81 mg total) by mouth daily.   atorvastatin (LIPITOR) 80 MG tablet TAKE ONE TABLET BY MOUTH ONCE DAILY AT 6PM.   carisoprodol (SOMA) 350 MG tablet Take 1 tablet (350 mg total) by mouth at bedtime as needed for muscle spasms.   dapagliflozin propanediol (FARXIGA) 10 MG TABS tablet Take 1 tablet (10 mg total) by mouth daily before breakfast.   Dulaglutide (TRULICITY) 5.36 RW/4.3XV SOPN INJECT 1 SYRINGE (50 MG) SUBCUTANEOUSLY ONCE A WEEK (ON THE SAME DAY OF EACH WEEK) AS DIRECTED.   furosemide (LASIX) 20 MG tablet Take 1 tablet (20 mg total) by mouth daily.   glipiZIDE (GLUCOTROL) 5 MG tablet TAKE (1) TABLET BY MOUTH TWICE DAILY.   Glucosamine-Chondroitin-MSM  (GLUCOS-CHOND-MSM-DOUBLE STR PO) Take 1 tablet by mouth daily.   levocetirizine (XYZAL) 5 MG tablet Take 5 mg by mouth daily as needed for allergies.   metFORMIN (GLUCOPHAGE) 1000 MG tablet TAKE (1) TABLET BY MOUTH TWICE A DAY WITH MEALS (BREAKFAST AND SUPPER).   metoprolol succinate (TOPROL XL) 100 MG 24 hr tablet Take 1 tablet (100 mg total) by mouth daily. Take with or immediately following a meal.   metoprolol succinate (TOPROL XL) 25 MG 24 hr tablet Take 1 tablet (25 mg total) by mouth daily.   Multiple Vitamin (MULTIVITAMIN WITH MINERALS) TABS tablet Take 1 tablet by mouth daily.   oxyCODONE-acetaminophen (PERCOCET) 5-325 MG tablet Take 1 tablet by mouth every 4 (four) hours as needed (max 6 q).   potassium chloride SA (KLOR-CON M) 20 MEQ tablet Take 2 tablets (40 mEq total) by mouth daily.   sacubitril-valsartan (ENTRESTO) 97-103 MG Take 1 tablet by mouth 2 (two) times daily.   spironolactone (ALDACTONE) 25 MG tablet TAKE (1) TABLET BY MOUTH ONCE DAILY.   umeclidinium-vilanterol (ANORO ELLIPTA) 62.5-25 MCG/ACT AEPB INHALE 1 PUFF INTO THE LUNGS ONCE DAILY.   No facility-administered encounter medications on file as of 02/07/2022.    Allergies (verified) Chantix [varenicline tartrate]   History: Past Medical History:  Diagnosis Date   Arthritis    Bladder cancer (  [facility]) urologist-  dr Kipp Brood   Borderline diabetes    Bursitis of both shoulders    CAD (coronary artery disease), native coronary artery    Cath 03/2019 with 20% prox and 60% mid LAD stenosis >>medical management recommended   Chronic systolic CHF (congestive heart failure), NYHA class 2 (HCC)    EF 29% by cMRI>>improved to 50-55% by echo 02/2021   Diabetes mellitus without complication ([place])    Disorder of male genital organs, unspecified    Dyspnea    Esophagus disorder    Constricted Esophagus, per New Patient Packet-PSC    History of basal cell carcinoma (BCC) excision    05/ 2018  right forehead   HLD  (hyperlipidemia)    Hypertension    Mild acid reflux no meds   Nocturia    OSA (obstructive sleep apnea)    per pt has not used cpap in years   Renal cyst, left 11/2011   16 mm   Sleep apnea    Per New Patient Packet-PSC    Smokers' cough ([place])    SOB (shortness of breath) on exertion    Past Surgical History:  Procedure Laterality Date   CYSTOSCOPY WITH BIOPSY  03/13/2012   Procedure: CYSTOSCOPY WITH BIOPSY;  Surgeon: Fredricka Bonine, MD;  Location: [facility];  Service: Urology;  Laterality: N/A;      ESOPHAGEAL DILATION  05/20/1986   Esophagus Stretch, per New Patient Packet-PSC    RIGHT/LEFT HEART CATH AND CORONARY ANGIOGRAPHY N/A 04/16/2019   Procedure: RIGHT/LEFT HEART CATH AND CORONARY ANGIOGRAPHY;  Surgeon: Troy Sine, MD;  Location: [facility];  Service: Cardiovascular;  Laterality: N/A;   SHOULDER ARTHROSCOPY WITH ROTATOR CUFF REPAIR Left 03/15/2021   Procedure: Left shoulder arthroscopy, debridement, subacromial decompression, distal clavicle resection, possible rotator cuff repair;  Surgeon: Justice Britain, MD;  Location: [facility];  Service: Orthopedics;  Laterality: Left;   TONSILLECTOMY  age 63   TRANSURETHRAL RESECTION OF BLADDER TUMOR N/A 05/23/2017   Procedure: TRANSURETHRAL RESECTION OF BLADDER TUMOR / (TURBT) WITH INSTILLATION OF EPIRUBICIN;  Surgeon: [name], MD;  Location[facility];  Service: Urology;  Laterality: N/A;   UVULECTOMY  05/20/1990   Dr.Byers, per new patient packet    UVULOPALATOPHARYNGOPLASTY  2005   osa   Family History  Problem Relation Age of Onset   Diabetes Mother    Dementia Mother    Stroke Mother    Arthritis Mother    Hypertension Mother    Multiple sclerosis Father    Diabetes Father    Diabetes Sister    Hypertension Sister    ADD / ADHD Son    Migraines Daughter    ADD / ADHD Daughter    Cancer Maternal Grandmother    Social History   Socioeconomic History    Marital status: Married    Spouse name: Not on file   Number of children: Not on file   Years of education: Not on file   Highest education level: Not on file  Occupational History   Not on file  Tobacco Use   Smoking status: Every Day    Packs/day: 0.50    Years: 47.00    Total pack years: 23.50    Types: Cigarettes   Smokeless tobacco: Former    Types: Chew    Quit date: 1970  Vaping Use   Vaping Use: Former  Substance and Sexual Activity   Alcohol use: Yes    Comment:  very seldom- rare   Drug use: Not Currently   Sexual activity: Not Currently  Other Topics Concern   Not on file  Social History Narrative   Diet: None      Caffeine: Yes      Married, if yes what year:  Yes, 1990      Do you live in a house, apartment, assisted living, condo, trailer, ect: House, 3 stories, 4 persons      Pets: 2 dogs, 2 cats       Highest level of education: Some Facilities manager profession: Clinical cytogeneticist       Exercise: Some         Living Will: Yes   DNR: Yes   POA/HPOA: Yes      Functional Status:   Do you have difficulty bathing or dressing yourself? No   Do you have difficulty preparing food or eating? No   Do you have difficulty managing your medications? No   Do you have difficulty managing your finances? No   Do you have difficulty affording your medications? No   Social Determinants of Radio broadcast assistant Strain: Not on file  Food Insecurity: Not on file  Transportation Needs: Not on file  Physical Activity: Not on file  Stress: Not on file  Social Connections: Not on file    Tobacco Counseling Ready to quit: Not Answered Counseling given: Not Answered   Clinical Intake:                 Diabetic?yes         Activities of Daily Living    03/01/2021   11:47 AM 03/01/2021   11:29 AM  In your present state of health, do you have any difficulty performing the following activities:  Hearing?  0  Vision?  0  Difficulty  concentrating or making decisions?  0  Walking or climbing stairs?  0  Dressing or bathing?  0  Doing errands, shopping? 0     Patient Care Team: Lauree Chandler, NP as PCP - General (Geriatric Medicine) Sueanne Margarita, MD as PCP - Cardiology (Cardiology) Melissa Montane, MD as Consulting Physician (Otolaryngology) [name], MD as Consulting Physician (Urology) Warden Fillers, MD as Consulting Physician (Ophthalmology)  Indicate any recent Medical Services you may have received from other than [facility] providers in the past year (date may be approximate).     Assessment:   This is a routine wellness examination for Timothy Lee.  Hearing/Vision screen Hearing Screening - Comments:: Patient has no hearing problems. Vision Screening - Comments:: Patient wears glasses. Patient had eye exam about a year ago. Patient sees Dr. Katy Fitch  Dietary issues and exercise activities discussed:     Goals Addressed   None    Depression Screen    02/07/2022    8:52 AM 10/22/2021   10:28 AM 02/07/2021   11:02 AM 01/30/2021    8:27 AM 06/28/2020   10:30 AM 09/16/2019   10:24 AM 01/04/2019    9:30 AM  PHQ 2/9 Scores  PHQ - 2 Score 0 0 0 0 0 0 0    Fall Risk    02/07/2022    8:55 AM 10/22/2021   10:28 AM 02/07/2021   11:01 AM 01/30/2021    8:30 AM 06/28/2020   10:29 AM  Fall Risk   Falls in the past year? 0 0 0 0 1  Number falls in past yr: 0 0 0 0 0  Injury with Fall? 0 0 0 0 0  Risk for fall due to : No Fall Risks No Fall Risks No Fall Risks No Fall Risks   Follow up Falls evaluation completed Falls evaluation completed Falls evaluation completed Falls evaluation completed     [place]:  Any stairs in or around the home? Yes  If so, are there any without handrails? No  Home free of loose throw rugs in walkways, pet beds, electrical cords, etc? No  Adequate lighting in your home to reduce risk of falls? Yes   ASSISTIVE DEVICES UTILIZED TO PREVENT  FALLS:  Life alert? No  Use of a cane, walker or w/c? No  Grab bars in the bathroom? No  Shower chair or bench in shower? No  Elevated toilet seat or a handicapped toilet? No   TIMED UP AND GO:  Was the test performed? No .    Cognitive Function:        02/07/2022    8:57 AM 01/30/2021    8:31 AM 09/16/2019   10:25 AM  6CIT Screen  What Year? 0 points 0 points 0 points  What month? 0 points 0 points 0 points  What time? 0 points 0 points 0 points  Count back from 20 0 points 0 points 0 points  Months in reverse 0 points 0 points 0 points  Repeat phrase 0 points 2 points 2 points  Total Score 0 points 2 points 2 points    Immunizations Immunization History  Administered Date(s) Administered   PFIZER(Purple Top)SARS-COV-2 Vaccination 12/22/2019, 01/30/2020   Pneumococcal Conjugate-13 09/16/2017   Pneumococcal Polysaccharide-23 12/21/2018    TDAP status: Due, Education has been provided regarding the importance of this vaccine. Advised may receive this vaccine at local pharmacy or Health Dept. Aware to provide a copy of the vaccination record if obtained from local pharmacy or Health Dept. Verbalized acceptance and understanding.  Flu Vaccine status: Declined, Education has been provided regarding the importance of this vaccine but patient still declined. Advised may receive this vaccine at local pharmacy or Health Dept. Aware to provide a copy of the vaccination record if obtained from local pharmacy or Health Dept. Verbalized acceptance and understanding.  Pneumococcal vaccine status: Up to date  Covid-19 vaccine status: Information provided on how to obtain vaccines.   Qualifies for Shingles Vaccine? Yes   Zostavax completed No   Shingrix Completed?: No.    Education has been provided regarding the importance of this vaccine. Patient has been advised to call insurance company to determine out of pocket expense if they have not yet received this vaccine. Advised may also  receive vaccine at local pharmacy or Health Dept. Verbalized acceptance and understanding.  Screening Tests Health Maintenance  Topic Date Due   Diabetic kidney evaluation - Urine ACR  Never done   TETANUS/TDAP  Never done   Zoster Vaccines- Shingrix (1 of 2) Never done   COVID-19 Vaccine (3 - Pfizer series) 03/26/2020   Fecal DNA (Cologuard)  12/24/2020   FOOT EXAM  06/28/2021   OPHTHALMOLOGY EXAM  09/27/2021   HEMOGLOBIN A1C  04/23/2022   Diabetic kidney evaluation - GFR measurement  10/23/2022   Pneumonia Vaccine 85+ Years old  Completed   HPV VACCINES  Aged Out   INFLUENZA VACCINE  Discontinued   Hepatitis C Screening  Discontinued    Health Maintenance  Health Maintenance Due  Topic Date Due   Diabetic kidney evaluation - Urine ACR  Never  done   TETANUS/TDAP  Never done   Zoster Vaccines- Shingrix (1 of 2) Never done   COVID-19 Vaccine (3 - Pfizer series) 03/26/2020   Fecal DNA (Cologuard)  12/24/2020   FOOT EXAM  06/28/2021   OPHTHALMOLOGY EXAM  09/27/2021    Colorectal cancer screening: Type of screening: Cologuard. Completed 09/27/2020. Repeat every 3 years  Lung Cancer Screening: (Low Dose CT Chest recommended if Age 63-80 years, 30 pack-year currently smoking OR have quit w/in 15years.) does qualify.   Lung Cancer Screening Referral: placed   Additional Screening:  Hepatitis C Screening: does qualify; declines  Vision Screening: Recommended annual ophthalmology exams for early detection of glaucoma and other disorders of the eye. Is the patient up to date with their annual eye exam?  No  Who is the provider or what is the name of the office in which the patient attends annual eye exams? [facility] If pt is not established with a provider, would they like to be referred to a provider to establish care? No .   Dental Screening: Recommended annual dental exams for proper oral hygiene  Community Resource Referral / Chronic Care Management: CRR required this visit?   No   CCM required this visit?  No      Plan:     I have personally reviewed and noted the following in the patient's chart:   Medical and social history Use of alcohol, tobacco or illicit drugs  Current medications and supplements including opioid prescriptions. Patient is not currently taking opioid prescriptions. Functional ability and status Nutritional status Physical activity Advanced directives List of other physicians Hospitalizations, surgeries, and ER visits in previous 12 months Vitals Screenings to include cognitive, depression, and falls Referrals and appointments  In addition, I have reviewed and discussed with patient certain preventive protocols, quality metrics, and best practice recommendations. A written personalized care plan for preventive services as well as general preventive health recommendations were provided to patient.     Lauree Chandler, NP   02/07/2022    Virtual Visit via Telephone Note  I connected with patient 02/07/22 at  9:00 AM EDT by telephone and verified that I am speaking with the correct person using two identifiers.  Location: Patient: home [facility]: [facility]   I discussed the limitations, risks, security and privacy concerns of performing an evaluation and management service by telephone and the availability of in person appointments. I also discussed with the patient that there may be a patient responsible charge related to this service. The patient expressed understanding and agreed to proceed.   I discussed the assessment and treatment plan with the patient. The patient was provided an opportunity to ask questions and all were answered. The patient agreed with the plan and demonstrated an understanding of the instructions.   The patient was advised to call back or seek an in-person evaluation if the symptoms worsen or if the condition fails to improve as anticipated.  I provided 15 minutes of non-face-to-face time  during this encounter.  Carlos American. Harle Battiest Avs printed and mailed

## 2022-02-07 NOTE — Progress Notes (Signed)
This service is provided via telemedicine  No vital signs collected/recorded due to the encounter was a telemedicine visit.   Location of patient (ex: home, work):  Home  Patient consents to a telephone visit:  Yes, see encounter dated 02/07/2022  Location of the provider (ex: office, home):  [place]  Name of any referring provider:  N/A  Names of all persons participating in the telemedicine service and their role in the encounter:  Sherrie Mustache, Nurse Practitioner, Carroll Kinds, CMA, and patient.   Time spent on call:  10 minutes with medical assistant

## 2022-02-25 ENCOUNTER — Ambulatory Visit: Payer: Medicare Other | Admitting: Nurse Practitioner

## 2022-02-25 DIAGNOSIS — Z1212 Encounter for screening for malignant neoplasm of rectum: Secondary | ICD-10-CM | POA: Diagnosis not present

## 2022-02-25 DIAGNOSIS — Z1211 Encounter for screening for malignant neoplasm of colon: Secondary | ICD-10-CM | POA: Diagnosis not present

## 2022-03-04 ENCOUNTER — Other Ambulatory Visit: Payer: Self-pay | Admitting: Nurse Practitioner

## 2022-03-05 LAB — COLOGUARD: COLOGUARD: NEGATIVE

## 2022-03-05 NOTE — Telephone Encounter (Signed)
Patient has an appointment pending for 03/25/2022

## 2022-03-22 ENCOUNTER — Ambulatory Visit: Payer: Medicare Other | Admitting: Nurse Practitioner

## 2022-03-25 ENCOUNTER — Ambulatory Visit: Payer: Medicare Other | Admitting: Nurse Practitioner

## 2022-04-20 ENCOUNTER — Other Ambulatory Visit: Payer: Self-pay | Admitting: Nurse Practitioner

## 2022-04-20 DIAGNOSIS — J209 Acute bronchitis, unspecified: Secondary | ICD-10-CM

## 2022-04-29 ENCOUNTER — Ambulatory Visit: Payer: Medicare Other | Admitting: Nurse Practitioner

## 2022-04-29 ENCOUNTER — Encounter: Payer: Self-pay | Admitting: Nurse Practitioner

## 2022-04-29 VITALS — BP 132/78 | HR 81 | Temp 97.3°F | Ht 71.0 in | Wt 242.0 lb

## 2022-04-29 DIAGNOSIS — I10 Essential (primary) hypertension: Secondary | ICD-10-CM

## 2022-04-29 DIAGNOSIS — E782 Mixed hyperlipidemia: Secondary | ICD-10-CM

## 2022-04-29 DIAGNOSIS — I5022 Chronic systolic (congestive) heart failure: Secondary | ICD-10-CM

## 2022-04-29 DIAGNOSIS — M542 Cervicalgia: Secondary | ICD-10-CM | POA: Diagnosis not present

## 2022-04-29 DIAGNOSIS — E1165 Type 2 diabetes mellitus with hyperglycemia: Secondary | ICD-10-CM | POA: Diagnosis not present

## 2022-04-29 DIAGNOSIS — M549 Dorsalgia, unspecified: Secondary | ICD-10-CM

## 2022-04-29 DIAGNOSIS — Z6833 Body mass index (BMI) 33.0-33.9, adult: Secondary | ICD-10-CM

## 2022-04-29 DIAGNOSIS — E6609 Other obesity due to excess calories: Secondary | ICD-10-CM

## 2022-04-29 MED ORDER — PREDNISONE 10 MG (21) PO TBPK: ORAL_TABLET | ORAL | 0 refills | Status: DC

## 2022-04-29 MED ORDER — ACCU-CHEK SOFTCLIX LANCETS MISC: 3 refills | Status: AC

## 2022-04-29 MED ORDER — CARISOPRODOL 350 MG PO TABS: 350.0000 mg | ORAL_TABLET | Freq: Every evening | ORAL | 0 refills | Status: AC | PRN

## 2022-04-29 NOTE — Progress Notes (Signed)
Careteam: Patient Care Team: Lauree Chandler, NP as PCP - General (Geriatric Medicine) Sueanne Margarita, MD as PCP - Cardiology (Cardiology) Melissa Montane, MD as Consulting Physician (Otolaryngology) [name], MD as Consulting Physician (Urology) Warden Fillers, MD as Consulting Physician (Ophthalmology)  PLACE OF SERVICE:  [place] Directive information Does Patient Have a Medical Advance Directive?: No, Would patient like information on creating a medical advance directive?: Yes (MAU/Ambulatory/Procedural Areas - Information given)  Allergies  Allergen Reactions   Chantix [Varenicline Tartrate]     Aggression     Chief Complaint  Patient presents with   Medical Management of Chronic Issues    6 month follow-up and foot exam. Discuss need for diabetic kidney evaluation, td/tdap, shingrix, eye exam, A1c, and additional covid boosters or post pone if patient refuses. NCIR verified. Cold off/on x 2 months. Patient c/o right ear concerns. 71- Wife manages medications and patient is not certain of any of them.      HPI: Patient is a 71 y.o. male for routine follow up.   DM- does not check blood sugar. Have not made appt for diabetic eye exam. He sees [facility].   Following with cardiology yearly for CHF and CAD. No chest pains, swelling, palpitations.   OSA- he packed up his machine when he moved but has not found it.   Smoker- half ppd, wants to cut back and try to stop.   He has a very strenuous/physical job.  Shoulder and neck started bothering him/painful. He said he strained himself and had to take a quarter of an old oxycodone to help him sleep. He has been lifting things that are too heavy but he does not have help.  Review of Systems:  Review of Systems  Constitutional:  Negative for chills, fever and weight loss.  HENT:  Negative for tinnitus.   Respiratory:  Negative for cough, sputum production and shortness of breath.    Cardiovascular:  Negative for chest pain, palpitations and leg swelling.  Gastrointestinal:  Negative for abdominal pain, constipation, diarrhea and heartburn.  Genitourinary:  Negative for dysuria, frequency and urgency.  Musculoskeletal:  Positive for back pain, myalgias and neck pain. Negative for falls and joint pain.  Skin: Negative.   Neurological:  Negative for dizziness and headaches.  Psychiatric/Behavioral:  Negative for depression and memory loss. The patient does not have insomnia.     Past Medical History:  Diagnosis Date   Arthritis    Bladder cancer [facility]) urologist-  dr Kipp Brood   Borderline diabetes    Bursitis of both shoulders    CAD (coronary artery disease), native coronary artery    Cath 03/2019 with 20% prox and 60% mid LAD stenosis >>medical management recommended   Chronic systolic CHF (congestive heart failure), NYHA class 2 (HCC)    EF 29% by cMRI>>improved to 50-55% by echo 02/2021   Diabetes mellitus without complication ([place])    Disorder of male genital organs, unspecified    Dyspnea    Esophagus disorder    Constricted Esophagus, per New Patient Packet-PSC    History of basal cell carcinoma (BCC) excision    05/ 2018  right forehead   HLD (hyperlipidemia)    Hypertension    Mild acid reflux no meds   Nocturia    OSA (obstructive sleep apnea)    per pt has not used cpap in years   Renal cyst, left 11/2011   16 mm   Sleep apnea  Per New Patient Packet-PSC    Smokers' cough ([place])    SOB (shortness of breath) on exertion    Past Surgical History:  Procedure Laterality Date   CYSTOSCOPY WITH BIOPSY  03/13/2012   Procedure: CYSTOSCOPY WITH BIOPSY;  Surgeon: Fredricka Bonine, MD;  Location: [facility];  Service: Urology;  Laterality: N/A;      ESOPHAGEAL DILATION  05/20/1986   Esophagus Stretch, per New Patient Packet-PSC    RIGHT/LEFT HEART CATH AND CORONARY ANGIOGRAPHY N/A 04/16/2019   Procedure: RIGHT/LEFT HEART CATH  AND CORONARY ANGIOGRAPHY;  Surgeon: Troy Sine, MD;  Location: [facility];  Service: Cardiovascular;  Laterality: N/A;   SHOULDER ARTHROSCOPY WITH ROTATOR CUFF REPAIR Left 03/15/2021   Procedure: Left shoulder arthroscopy, debridement, subacromial decompression, distal clavicle resection, possible rotator cuff repair;  Surgeon: Justice Britain, MD;  Location: [facility];  Service: Orthopedics;  Laterality: Left;   TONSILLECTOMY  age 71   TRANSURETHRAL RESECTION OF BLADDER TUMOR N/A 05/23/2017   Procedure: TRANSURETHRAL RESECTION OF BLADDER TUMOR / (TURBT) WITH INSTILLATION OF EPIRUBICIN;  Surgeon: [name], MD;  Location: [facility];  Service: Urology;  Laterality: N/A;   UVULECTOMY  05/20/1990   Dr.Byers, per new patient packet    UVULOPALATOPHARYNGOPLASTY  2005   osa   Social History:   reports that he has been smoking cigarettes. He has a 23.50 pack-year smoking history. He quit smokeless tobacco use about 53 years ago.  His smokeless tobacco use included chew. He reports current alcohol use. He reports that he does not currently use drugs.  Family History  Problem Relation Age of Onset   Diabetes Mother    Dementia Mother    Stroke Mother    Arthritis Mother    Hypertension Mother    Multiple sclerosis Father    Diabetes Father    Diabetes Sister    Hypertension Sister    ADD / ADHD Son    Migraines Daughter    ADD / ADHD Daughter    Cancer Maternal Grandmother     Medications: Patient's Medications  New Prescriptions   No medications on file  Previous Medications   ACCU-CHEK GUIDE TEST STRIP    USE TO TEST BLOOD SUGAR DAILY.   ACCU-CHEK SOFTCLIX LANCETS LANCETS    USE TO TEST BLOOD SUGAR DAILY.   ACETAMINOPHEN (TYLENOL) 500 MG TABLET    Take 2 tablets (1,000 mg total) by mouth every 8 (eight) hours as needed for moderate pain.   ALBUTEROL (VENTOLIN HFA) 108 (90 BASE) MCG/ACT INHALER    INHALE 2 PUFFS INTO THE LUNGS EVERY 6 HOURS AS NEEDED FOR  WHEEZING OR SHORTNESS OF BREATH.   ASPIRIN 81 MG CHEWABLE TABLET    Chew 1 tablet (81 mg total) by mouth daily.   ATORVASTATIN (LIPITOR) 80 MG TABLET    TAKE ONE TABLET BY MOUTH ONCE DAILY AT 6PM.   CARISOPRODOL (SOMA) 350 MG TABLET    Take 1 tablet (350 mg total) by mouth at bedtime as needed for muscle spasms.   DAPAGLIFLOZIN PROPANEDIOL (FARXIGA) 10 MG TABS TABLET    Take 1 tablet (10 mg total) by mouth daily before breakfast.   DULAGLUTIDE (TRULICITY) 5.80 DX/8.3JA SOPN    INJECT 1 SYRINGE (50 MG) SUBCUTANEOUSLY ONCE A WEEK (ON THE SAME DAY OF EACH WEEK) AS DIRECTED.   FUROSEMIDE (LASIX) 20 MG TABLET    Take 1 tablet (20 mg total) by mouth daily.   GLIPIZIDE (GLUCOTROL) 5 MG TABLET  TAKE (1) TABLET BY MOUTH TWICE DAILY.   GLUCOSAMINE-CHONDROITIN-MSM (GLUCOS-CHOND-MSM-DOUBLE STR PO)    Take 1 tablet by mouth daily.   LEVOCETIRIZINE (XYZAL) 5 MG TABLET    Take 5 mg by mouth daily as needed for allergies.   METFORMIN (GLUCOPHAGE) 1000 MG TABLET    TAKE (1) TABLET BY MOUTH TWICE A DAY WITH MEALS (BREAKFAST AND SUPPER).   METOPROLOL SUCCINATE (TOPROL XL) 100 MG 24 HR TABLET    Take 1 tablet (100 mg total) by mouth daily. Take with or immediately following a meal.   METOPROLOL SUCCINATE (TOPROL XL) 25 MG 24 HR TABLET    Take 1 tablet (25 mg total) by mouth daily.   MULTIPLE VITAMIN (MULTIVITAMIN WITH MINERALS) TABS TABLET    Take 1 tablet by mouth daily.   OXYCODONE-ACETAMINOPHEN (PERCOCET) 5-325 MG TABLET    Take 1 tablet by mouth every 4 (four) hours as needed (max 6 q).   POTASSIUM CHLORIDE SA (KLOR-CON M) 20 MEQ TABLET    Take 2 tablets (40 mEq total) by mouth daily.   SACUBITRIL-VALSARTAN (ENTRESTO) 97-103 MG    Take 1 tablet by mouth 2 (two) times daily.   SPIRONOLACTONE (ALDACTONE) 25 MG TABLET    TAKE (1) TABLET BY MOUTH ONCE DAILY.   UMECLIDINIUM-VILANTEROL (ANORO ELLIPTA) 62.5-25 MCG/ACT AEPB    INHALE 1 PUFF INTO THE LUNGS ONCE DAILY.  Modified Medications   No medications on file   Discontinued Medications   No medications on file    Physical Exam:  Vitals:   04/29/22 0842  BP: 132/78  Pulse: 81  Temp: (!) 97.3 F (36.3 C)  TempSrc: Temporal  SpO2: 95%  Weight: 242 lb (109.8 kg)  Height: _0  (1.803 m)   Body mass index is 33.75 kg/m. Wt Readings from Last 3 Encounters:  04/29/22 242 lb (109.8 kg)  10/22/21 248 lb (112.5 kg)  08/08/21 240 lb (108.9 kg)    Physical Exam Constitutional:      General: He is not in acute distress.    Appearance: He is well-developed. He is not diaphoretic.  HENT:     Head: Normocephalic and atraumatic.     Mouth/Throat:     Pharynx: No oropharyngeal exudate.  Eyes:     Conjunctiva/sclera: Conjunctivae normal.     Pupils: Pupils are equal, round, and reactive to light.  Cardiovascular:     Rate and Rhythm: Normal rate and regular rhythm.     Heart sounds: Normal heart sounds.  Pulmonary:     Effort: Pulmonary effort is normal.     Breath sounds: Normal breath sounds.  Abdominal:     General: Bowel sounds are normal.     Palpations: Abdomen is soft.  Musculoskeletal:        General: No tenderness.     Cervical back: Normal range of motion and neck supple.  Skin:    General: Skin is warm and dry.  Neurological:     Mental Status: He is alert and oriented to person, place, and time.   Labs reviewed: Basic Metabolic Panel: Recent Labs    10/22/21 1101  NA 138  K 4.5  CL 103  CO2 26  GLUCOSE 152*  BUN 14  CREATININE 1.02  CALCIUM 10.0   Liver Function Tests: Recent Labs    10/22/21 1101  AST 16  ALT 21  BILITOT 1.3*  PROT 6.4   No results for input(s): "LIPASE", "AMYLASE" in the last 8760 hours. No results for input(s): "AMMONIA" in  the last 8760 hours. CBC: Recent Labs    10/22/21 1101  WBC 10.1  NEUTROABS 7,413  HGB 16.7  HCT 49.8  MCV 91.9  PLT 266   Lipid Panel: No results for input(s): "CHOL", "HDL", "LDLCALC", "TRIG", "CHOLHDL", "LDLDIRECT" in the last 8760  hours. TSH: No results for input(s): "TSH" in the last 8760 hours. A1C: Lab Results  Component Value Date   HGBA1C 6.4 (H) 10/22/2021     Assessment/Plan 1. Type 2 diabetes mellitus with hyperglycemia, without long-term current use of insulin (HCC) -Encouraged dietary compliance, routine foot care/monitoring and to keep up with diabetic eye exams through ophthalmology  -continue medication as prescribed - Hemoglobin A1c - Microalbumin/Creatinine Ratio, Urine  2. Chronic systolic CHF (congestive heart failure), NYHA class 2 (HCC) -stable, no shortness of breath, chest pains, palpitations or weight gain.  -continue current regimen  3. Mixed hyperlipidemia Continues on lipitor, will follow up lab.  - Lipid panel  4. Essential hypertension -Blood pressure well controlled, goal bp <140/90 Continue current medications and dietary modifications follow metabolic panel - CBC with Differential/Platelet - CMP with eGFR([facility])  5. Class 1 obesity due to excess calories with serious comorbidity and body mass index (BMI) of 33.0 to 33.9 in adult --education provided on healthy weight loss through increase in physical activity and proper nutrition   6. Back pain, unspecified back location, unspecified back pain laterality, unspecified chronicity -discussed proper body mechanics and to avoid heavy lifting.  - carisoprodol (SOMA) 350 MG tablet; Take 1 tablet (350 mg total) by mouth at bedtime as needed for muscle spasms.  Dispense: 30 tablet; Refill: 0  7. Neck pain Strain from heavy lifting, discussed proper body mechanics and to avoid lifting heavy objects  Use muscle relaxer every night for 3-5 nights  Prednisone as prescribe Heating pad twice daily   - predniSONE (STERAPRED UNI-PAK 21 TAB) 10 MG (21) TBPK tablet; Use as directed  Dispense: 21 tablet; Refill: 0   Return in about 6 months (around 10/29/2022) for routine follow up . [facility] [employer]. [place], [place]  [facility] [phone]

## 2022-04-29 NOTE — Patient Instructions (Addendum)
Netipot or saline wash daily Plain nasal saline spray throughout the day as needed May use tylenol 325 mg 2 tablets every 6-8 hours as needed aches and pains or sore throat humidifier in the home to help with the dry air Mucinex DM by mouth twice daily as needed for cough and chest congestion with full glass of water  Keep well hydrated Proper nutrition  Avoid forcefully blowing nose Vit c 1000 mg daily Vit d 2000 units daily    Use muscle relaxer every night for 3-5 nights  Prednisone as prescribe Heating pad twice daily

## 2022-04-30 LAB — COMPLETE METABOLIC PANEL WITH GFR
AG Ratio: 2.3 (calc) (ref 1.0–2.5)
ALT: 19 U/L (ref 9–46)
AST: 14 U/L (ref 10–35)
Albumin: 4.6 g/dL (ref 3.6–5.1)
Alkaline phosphatase (APISO): 50 U/L (ref 35–144)
BUN: 18 mg/dL (ref 7–25)
CO2: 30 mmol/L (ref 20–32)
Calcium: 10.2 mg/dL (ref 8.6–10.3)
Chloride: 103 mmol/L (ref 98–110)
Creat: 1.08 mg/dL (ref 0.70–1.28)
Globulin: 2 g/dL (calc) (ref 1.9–3.7)
Glucose, Bld: 115 mg/dL — ABNORMAL HIGH (ref 65–99)
Potassium: 5.1 mmol/L (ref 3.5–5.3)
Sodium: 138 mmol/L (ref 135–146)
Total Bilirubin: 0.9 mg/dL (ref 0.2–1.2)
Total Protein: 6.6 g/dL (ref 6.1–8.1)
eGFR: 74 mL/min/{1.73_m2} (ref >=60)

## 2022-04-30 LAB — CBC WITH DIFFERENTIAL/PLATELET
Absolute Monocytes: 632 cells/uL (ref 200–950)
Basophils Absolute: 37 cells/uL (ref 0–200)
Basophils Relative: 0.4 %
Eosinophils Absolute: 586 cells/uL — ABNORMAL HIGH (ref 15–500)
Eosinophils Relative: 6.3 %
HCT: 50.7 % — ABNORMAL HIGH (ref 38.5–50.0)
Hemoglobin: 17.4 g/dL — ABNORMAL HIGH (ref 13.2–17.1)
Lymphs Abs: 1897 cells/uL (ref 850–3900)
MCH: 31 pg (ref 27.0–33.0)
MCHC: 34.3 g/dL (ref 32.0–36.0)
MCV: 90.4 fL (ref 80.0–100.0)
MPV: 9.8 fL (ref 7.5–12.5)
Monocytes Relative: 6.8 %
Neutro Abs: 6147 cells/uL (ref 1500–7800)
Neutrophils Relative %: 66.1 %
Platelets: 304 10*3/uL (ref 140–400)
RBC: 5.61 10*6/uL (ref 4.20–5.80)
RDW: 12 % (ref 11.0–15.0)
Total Lymphocyte: 20.4 %
WBC: 9.3 10*3/uL (ref 3.8–10.8)

## 2022-04-30 LAB — HEMOGLOBIN A1C
Hgb A1c MFr Bld: 6.8 % of total Hgb — ABNORMAL HIGH (ref <5.7)
Mean Plasma Glucose: 148 mg/dL
eAG (mmol/L): 8.2 mmol/L

## 2022-04-30 LAB — LIPID PANEL
Cholesterol: 117 mg/dL (ref <200)
HDL: 46 mg/dL (ref >=40)
LDL Cholesterol (Calc): 49 mg/dL (calc)
Non-HDL Cholesterol (Calc): 71 mg/dL (calc) (ref <130)
Total CHOL/HDL Ratio: 2.5 (calc) (ref <5.0)
Triglycerides: 136 mg/dL (ref <150)

## 2022-04-30 LAB — MICROALBUMIN / CREATININE URINE RATIO
Creatinine, Urine: 92 mg/dL (ref 20–320)
Microalb Creat Ratio: 3 mcg/mg creat (ref <30)
Microalb, Ur: 0.3 mg/dL

## 2022-05-13 ENCOUNTER — Inpatient Hospital Stay (HOSPITAL_BASED_OUTPATIENT_CLINIC_OR_DEPARTMENT_OTHER)
Admission: EM | Admit: 2022-05-13 | Discharge: 2022-05-15 | DRG: 603 | Disposition: A | Discharge status: Home / Self Care | Payer: Medicare Other | Attending: Internal Medicine | Admitting: Internal Medicine

## 2022-05-13 ENCOUNTER — Emergency Department (HOSPITAL_BASED_OUTPATIENT_CLINIC_OR_DEPARTMENT_OTHER): Payer: Medicare Other

## 2022-05-13 ENCOUNTER — Encounter (HOSPITAL_COMMUNITY): Payer: Self-pay

## 2022-05-13 ENCOUNTER — Emergency Department (HOSPITAL_BASED_OUTPATIENT_CLINIC_OR_DEPARTMENT_OTHER): Payer: Medicare Other | Admitting: Radiology

## 2022-05-13 ENCOUNTER — Other Ambulatory Visit: Payer: Self-pay

## 2022-05-13 DIAGNOSIS — E1165 Type 2 diabetes mellitus with hyperglycemia: Secondary | ICD-10-CM | POA: Diagnosis not present

## 2022-05-13 DIAGNOSIS — L089 Local infection of the skin and subcutaneous tissue, unspecified: Secondary | ICD-10-CM | POA: Diagnosis not present

## 2022-05-13 DIAGNOSIS — Z7985 Long-term (current) use of injectable non-insulin antidiabetic drugs: Secondary | ICD-10-CM | POA: Diagnosis not present

## 2022-05-13 DIAGNOSIS — J4489 Other specified chronic obstructive pulmonary disease: Secondary | ICD-10-CM | POA: Diagnosis present

## 2022-05-13 DIAGNOSIS — I25119 Atherosclerotic heart disease of native coronary artery with unspecified angina pectoris: Secondary | ICD-10-CM | POA: Diagnosis not present

## 2022-05-13 DIAGNOSIS — L03114 Cellulitis of left upper limb: Principal | ICD-10-CM | POA: Diagnosis present

## 2022-05-13 DIAGNOSIS — Z7952 Long term (current) use of systemic steroids: Secondary | ICD-10-CM | POA: Diagnosis not present

## 2022-05-13 DIAGNOSIS — I10 Essential (primary) hypertension: Secondary | ICD-10-CM | POA: Diagnosis not present

## 2022-05-13 DIAGNOSIS — E785 Hyperlipidemia, unspecified: Secondary | ICD-10-CM | POA: Diagnosis present

## 2022-05-13 DIAGNOSIS — W540XXA Bitten by dog, initial encounter: Secondary | ICD-10-CM | POA: Diagnosis not present

## 2022-05-13 DIAGNOSIS — Z7984 Long term (current) use of oral hypoglycemic drugs: Secondary | ICD-10-CM

## 2022-05-13 DIAGNOSIS — F17210 Nicotine dependence, cigarettes, uncomplicated: Secondary | ICD-10-CM | POA: Diagnosis present

## 2022-05-13 DIAGNOSIS — Z720 Tobacco use: Secondary | ICD-10-CM | POA: Diagnosis present

## 2022-05-13 DIAGNOSIS — Z888 Allergy status to other drugs, medicaments and biological substances status: Secondary | ICD-10-CM | POA: Diagnosis not present

## 2022-05-13 DIAGNOSIS — G4733 Obstructive sleep apnea (adult) (pediatric): Secondary | ICD-10-CM | POA: Diagnosis not present

## 2022-05-13 DIAGNOSIS — Z8249 Family history of ischemic heart disease and other diseases of the circulatory system: Secondary | ICD-10-CM | POA: Diagnosis not present

## 2022-05-13 DIAGNOSIS — T148XXA Other injury of unspecified body region, initial encounter: Principal | ICD-10-CM

## 2022-05-13 DIAGNOSIS — I5022 Chronic systolic (congestive) heart failure: Secondary | ICD-10-CM | POA: Diagnosis not present

## 2022-05-13 DIAGNOSIS — E7800 Pure hypercholesterolemia, unspecified: Secondary | ICD-10-CM

## 2022-05-13 DIAGNOSIS — M7989 Other specified soft tissue disorders: Secondary | ICD-10-CM | POA: Diagnosis not present

## 2022-05-13 DIAGNOSIS — Z8551 Personal history of malignant neoplasm of bladder: Secondary | ICD-10-CM | POA: Diagnosis not present

## 2022-05-13 DIAGNOSIS — Z23 Encounter for immunization: Secondary | ICD-10-CM | POA: Diagnosis not present

## 2022-05-13 DIAGNOSIS — Z7951 Long term (current) use of inhaled steroids: Secondary | ICD-10-CM | POA: Diagnosis not present

## 2022-05-13 DIAGNOSIS — Z91199 Patient's noncompliance with other medical treatment and regimen due to unspecified reason: Secondary | ICD-10-CM

## 2022-05-13 DIAGNOSIS — I110 Hypertensive heart disease with heart failure: Secondary | ICD-10-CM | POA: Diagnosis not present

## 2022-05-13 DIAGNOSIS — J449 Chronic obstructive pulmonary disease, unspecified: Secondary | ICD-10-CM | POA: Diagnosis not present

## 2022-05-13 DIAGNOSIS — Z7982 Long term (current) use of aspirin: Secondary | ICD-10-CM | POA: Diagnosis not present

## 2022-05-13 DIAGNOSIS — Z833 Family history of diabetes mellitus: Secondary | ICD-10-CM

## 2022-05-13 DIAGNOSIS — Z716 Tobacco abuse counseling: Secondary | ICD-10-CM

## 2022-05-13 DIAGNOSIS — Z79899 Other long term (current) drug therapy: Secondary | ICD-10-CM

## 2022-05-13 DIAGNOSIS — S61452A Open bite of left hand, initial encounter: Secondary | ICD-10-CM | POA: Diagnosis not present

## 2022-05-13 DIAGNOSIS — W540XXD Bitten by dog, subsequent encounter: Secondary | ICD-10-CM | POA: Diagnosis not present

## 2022-05-13 LAB — CBC WITH DIFFERENTIAL/PLATELET
Abs Immature Granulocytes: 0.07 10*3/uL (ref 0.00–0.07)
Basophils Absolute: 0 10*3/uL (ref 0.0–0.1)
Basophils Relative: 0 %
Eosinophils Absolute: 0.4 10*3/uL (ref 0.0–0.5)
Eosinophils Relative: 3 %
HCT: 47 % (ref 39.0–52.0)
Hemoglobin: 15.9 g/dL (ref 13.0–17.0)
Immature Granulocytes: 1 %
Lymphocytes Relative: 11 %
Lymphs Abs: 1.5 10*3/uL (ref 0.7–4.0)
MCH: 30.8 pg (ref 26.0–34.0)
MCHC: 33.8 g/dL (ref 30.0–36.0)
MCV: 90.9 fL (ref 80.0–100.0)
Monocytes Absolute: 1 10*3/uL (ref 0.1–1.0)
Monocytes Relative: 7 %
Neutro Abs: 10.4 10*3/uL — ABNORMAL HIGH (ref 1.7–7.7)
Neutrophils Relative %: 78 %
Platelets: 202 10*3/uL (ref 150–400)
RBC: 5.17 MIL/uL (ref 4.22–5.81)
RDW: 12.8 % (ref 11.5–15.5)
WBC: 13.4 10*3/uL — ABNORMAL HIGH (ref 4.0–10.5)
nRBC: 0 % (ref 0.0–0.2)

## 2022-05-13 LAB — COMPREHENSIVE METABOLIC PANEL
ALT: 22 U/L (ref 0–44)
AST: 12 U/L — ABNORMAL LOW (ref 15–41)
Albumin: 4.3 g/dL (ref 3.5–5.0)
Alkaline Phosphatase: 47 U/L (ref 38–126)
Anion gap: 13 (ref 5–15)
BUN: 16 mg/dL (ref 8–23)
CO2: 21 mmol/L — ABNORMAL LOW (ref 22–32)
Calcium: 9.5 mg/dL (ref 8.9–10.3)
Chloride: 102 mmol/L (ref 98–111)
Creatinine, Ser: 0.9 mg/dL (ref 0.61–1.24)
GFR, Estimated: >60 mL/min (ref >60)
Glucose, Bld: 216 mg/dL — ABNORMAL HIGH (ref 70–99)
Potassium: 4 mmol/L (ref 3.5–5.1)
Sodium: 136 mmol/L (ref 135–145)
Total Bilirubin: 1.3 mg/dL — ABNORMAL HIGH (ref 0.3–1.2)
Total Protein: 6.6 g/dL (ref 6.5–8.1)

## 2022-05-13 LAB — LACTIC ACID, PLASMA
Lactic Acid, Venous: 2.3 mmol/L (ref 0.5–1.9)
Lactic Acid, Venous: 1.1 mmol/L (ref 0.5–1.9)

## 2022-05-13 MED ORDER — SACUBITRIL-VALSARTAN 97-103 MG PO TABS
1.0000 | ORAL_TABLET | Freq: Two times a day (BID) | ORAL | Status: DC
  Administered 2022-05-14 – 2022-05-15 (×4): 1 via ORAL
  Filled 2022-05-13 (×5): qty 1

## 2022-05-13 MED ORDER — SODIUM CHLORIDE 0.9 % IV SOLN: INTRAVENOUS | Status: AC

## 2022-05-13 MED ORDER — ACETAMINOPHEN 650 MG RE SUPP: 650.0000 mg | Freq: Four times a day (QID) | RECTAL | Status: DC | PRN

## 2022-05-13 MED ORDER — METOPROLOL SUCCINATE ER 50 MG PO TB24
100.0000 mg | ORAL_TABLET | Freq: Every day | ORAL | Status: DC
  Administered 2022-05-14 – 2022-05-15 (×2): 100 mg via ORAL
  Filled 2022-05-13 (×2): qty 2

## 2022-05-13 MED ORDER — ATORVASTATIN CALCIUM 40 MG PO TABS
80.0000 mg | ORAL_TABLET | Freq: Every day | ORAL | Status: DC
  Administered 2022-05-14 – 2022-05-15 (×2): 80 mg via ORAL
  Filled 2022-05-13 (×2): qty 2

## 2022-05-13 MED ORDER — ALBUTEROL SULFATE (2.5 MG/3ML) 0.083% IN NEBU: 3.0000 mL | INHALATION_SOLUTION | Freq: Four times a day (QID) | RESPIRATORY_TRACT | Status: DC | PRN

## 2022-05-13 MED ORDER — SPIRONOLACTONE 25 MG PO TABS
25.0000 mg | ORAL_TABLET | Freq: Every day | ORAL | Status: DC
  Administered 2022-05-14 – 2022-05-15 (×2): 25 mg via ORAL
  Filled 2022-05-13 (×2): qty 1

## 2022-05-13 MED ORDER — TETANUS-DIPHTH-ACELL PERTUSSIS 5-2.5-18.5 LF-MCG/0.5 IM SUSY
0.5000 mL | PREFILLED_SYRINGE | Freq: Once | INTRAMUSCULAR | Status: AC
  Administered 2022-05-13: 0.5 mL via INTRAMUSCULAR
  Filled 2022-05-13: qty 0.5

## 2022-05-13 MED ORDER — METOPROLOL SUCCINATE ER 25 MG PO TB24
25.0000 mg | ORAL_TABLET | Freq: Every day | ORAL | Status: DC
  Administered 2022-05-14 – 2022-05-15 (×2): 25 mg via ORAL
  Filled 2022-05-13 (×2): qty 1

## 2022-05-13 MED ORDER — INSULIN ASPART 100 UNIT/ML IJ SOLN
0.0000 [IU] | INTRAMUSCULAR | Status: DC
  Administered 2022-05-14: 1 [IU] via SUBCUTANEOUS

## 2022-05-13 MED ORDER — SODIUM CHLORIDE 0.9 % IV SOLN
3.0000 g | Freq: Four times a day (QID) | INTRAVENOUS | Status: DC
  Administered 2022-05-14 – 2022-05-15 (×7): 3 g via INTRAVENOUS
  Filled 2022-05-13 (×8): qty 8

## 2022-05-13 MED ORDER — IOHEXOL 300 MG/ML  SOLN
80.0000 mL | Freq: Once | INTRAMUSCULAR | Status: AC | PRN
  Administered 2022-05-13: 80 mL via INTRAVENOUS

## 2022-05-13 MED ORDER — SODIUM CHLORIDE 0.9 % IV BOLUS
1000.0000 mL | Freq: Once | INTRAVENOUS | Status: AC
  Administered 2022-05-13: 1000 mL via INTRAVENOUS

## 2022-05-13 MED ORDER — ACETAMINOPHEN 325 MG PO TABS: 650.0000 mg | ORAL_TABLET | Freq: Four times a day (QID) | ORAL | Status: DC | PRN

## 2022-05-13 MED ORDER — SODIUM CHLORIDE 0.9 % IV SOLN
1.5000 g | Freq: Once | INTRAVENOUS | Status: AC
  Administered 2022-05-13: 1.5 g via INTRAVENOUS

## 2022-05-13 MED ORDER — HYDROCODONE-ACETAMINOPHEN 5-325 MG PO TABS
1.0000 | ORAL_TABLET | ORAL | Status: DC | PRN
  Administered 2022-05-14 (×3): 2 via ORAL
  Filled 2022-05-13 (×3): qty 2

## 2022-05-13 MED ORDER — ALBUTEROL SULFATE (2.5 MG/3ML) 0.083% IN NEBU: 2.5000 mg | INHALATION_SOLUTION | RESPIRATORY_TRACT | Status: DC | PRN

## 2022-05-13 NOTE — Plan of Care (Signed)
  Problem: Activity: Goal: Risk for activity intolerance will decrease Outcome: Not Applicable   Problem: Coping: Goal: Level of anxiety will decrease Outcome: Progressing

## 2022-05-13 NOTE — ED Notes (Signed)
Blood cultures drawn before start of antibotics

## 2022-05-13 NOTE — ED Triage Notes (Signed)
Patient arrives with complaints of worsening left hand swelling and redness around a dog bite site. Patient was bit by a dog 3 days ago and started on oral antibiotics.   Rates pain a 3/10.

## 2022-05-13 NOTE — Assessment & Plan Note (Signed)
Chronic stable continue home medications ?

## 2022-05-13 NOTE — Assessment & Plan Note (Addendum)
Hold aspirin for tonight given possibility of needing intervention at some point.  Continue Lipitor 80 mg p.o. daily continue Toprol 100 the morning 25 milligrams at night

## 2022-05-13 NOTE — H&P (Signed)
Timothy Lee TFT:[id number] DOB: 1950-07-05 DOA: 05/13/2022     PCP: Lauree Chandler, NP   Outpatient Specialists:   CARDS:  Dr.Turner   Urology Dr. Earmon Phoenix  Patient arrived to ER on 05/13/22 at 0919 Referred by Attending Oswald Hillock, MD   Patient coming from:    home Lives  With family    Chief Complaint:   Chief Complaint  Patient presents with   Animal Bite   Wound Check    HPI: Timothy Lee is a 71 y.o. male with medical history significant of Dm2, HTN , HLD, CHF systolic, bladder cancer, tobacco abuse    Presented with   left hand swelling   Was bit by a dog on his left hand 3 days ago and was started on oral antibiotics ( amoxicillin) it is his own dog and is up-to-date with his vaccines  Pt is diabetic reports left hand swelling, redness, drainage.  Redness spreading up the left arm   No fever or chill no CP or SOB   He does smoke Trying to quit  Drinks socially     Regarding pertinent Chronic problems:    Hyperlipidemia -  on statins Lipitor (atorvastatin)  Lipid Panel     Component Value Date/Time   CHOL 117 04/29/2022 0917   CHOL 91 (L) 02/12/2021 1033   TRIG 136 04/29/2022 0917   HDL 46 04/29/2022 0917   HDL 39 (L) 02/12/2021 1033   CHOLHDL 2.5 04/29/2022 0917   VLDL 10 04/16/2019 0420   LDLCALC 49 04/29/2022 0917   LABVLDL 22 02/12/2021 1033     HTN on toprol, spironolactone   chronic CHF  systolic  - last echo 7062 EF 50 -55% On Lasix entersto     DM 2 -  Lab Results  Component Value Date   HGBA1C 6.8 (H) 04/29/2022   On  Farxiga and trulicity    Hypothyroidism:  Lab Results  Component Value Date   TSH 0.303 (L) 04/15/2019   on synthroid    obesity-   BMI Readings from Last 1 Encounters:  05/13/22 33.76 kg/m     COPD - not  followed by pulmonology   not  on baseline oxygen        OSA -on  noncompliant with CPAP    While in ER: Clinical Course as of 05/13/22 2310  Mon May 13, 2022  1307 Pt re-eval [SB]   1446 Consult To Dr. And will admit to [facility] [SB]    Clinical Course User Index [SB] Blue, Soijett A, PA-C   Noted to have initially lactic acid 2.3 now improved 1.1 after rehydration.  White blood cell count elevation of 14.3 Started on Unasyn and given Tdap booster   Ordered CT head to ensure no evidence of abscess or osteomyelitis no soft tissue gas    Following Medications were ordered in ER: Medications  sodium chloride 0.9 % bolus 1,000 mL (0 mLs Intravenous Stopped 05/13/22 1231)  ampicillin-sulbactam (UNASYN) 1.5 g in sodium chloride 0.9 % 100 mL IVPB (0 g Intravenous Stopped 05/13/22 1207)  iohexol (OMNIPAQUE) 300 MG/ML solution 80 mL (80 mLs Intravenous Contrast Given 05/13/22 1120)  Tdap (BOOSTRIX) injection 0.5 mL (0.5 mLs Intramuscular Given 05/13/22 1210)       ED Triage Vitals  Enc Vitals Group     BP 05/13/22 0931 (!) 143/95     Pulse Rate 05/13/22 0931 92     Resp 05/13/22 0931 18  Temp 05/13/22 0931 98.3 F (36.8 C)     Temp Source 05/13/22 1325 Oral     SpO2 05/13/22 0931 95 %     Weight 05/13/22 0944 242 lb 1 oz (109.8 kg)     Height 05/13/22 0944 '5\' 11"'$  (1.803 m)     Head Circumference --      Peak Flow --      Pain Score 05/13/22 1958 0     Pain Loc --      Pain Edu? --      Excl. in [place]? --   TMAX(24)@     _________________________________________ Significant initial  Findings: Abnormal Labs Reviewed  LACTIC ACID, PLASMA - Abnormal; Notable for the following components:      Result Value   Lactic Acid, Venous 2.3 (*)    All other components within normal limits  COMPREHENSIVE METABOLIC PANEL - Abnormal; Notable for the following components:   CO2 21 (*)    Glucose, Bld 216 (*)    AST 12 (*)    Total Bilirubin 1.3 (*)    All other components within normal limits  CBC WITH DIFFERENTIAL/PLATELET - Abnormal; Notable for the following components:   WBC 13.4 (*)    Neutro Abs 10.4 (*)    All other components within normal limits    WBC      Component Value Date/Time   WBC 13.4 (H) 05/13/2022 0946   LYMPHSABS 1.5 05/13/2022 0946   MONOABS 1.0 05/13/2022 0946   EOSABS 0.4 05/13/2022 0946   BASOSABS 0.0 05/13/2022 0946     Lactic Acid, Venous    Component Value Date/Time   LATICACIDVEN 1.1 05/13/2022 1146    _______________________________________________ Hospitalist was called for admission for   Animal bite  Cellulitis of left upper extremity    The following Work up has been ordered so far:  Orders Placed This Encounter  Procedures   Culture, blood (routine x 2)   CT HAND LEFT W CONTRAST   Lactic acid, plasma   Comprehensive metabolic panel   CBC with Differential   Consult to hospitalist   Place in observation (patient's expected length of stay will be less than 2 midnights)     OTHER Significant initial  Findings:  labs showing:  Recent Labs  Lab 05/13/22 0946  NA 136  K 4.0  CO2 21*  GLUCOSE 216*  BUN 16  CREATININE 0.90  CALCIUM 9.5    Cr    stable,    Lab Results  Component Value Date   CREATININE 0.90 05/13/2022   CREATININE 1.08 04/29/2022   CREATININE 1.02 10/22/2021    Recent Labs  Lab 05/13/22 0946  AST 12*  ALT 22  ALKPHOS 47  BILITOT 1.3*  PROT 6.6  ALBUMIN 4.3   Lab Results  Component Value Date   CALCIUM 9.5 05/13/2022    Plt: Lab Results  Component Value Date   PLT 202 05/13/2022    COVID-19 Labs  No results for input(s): "DDIMER", "FERRITIN", "LDH", "CRP" in the last 72 hours.  Lab Results  Component Value Date   SARSCOV2NAA NEGATIVE 04/14/2019    Recent Labs  Lab 05/13/22 0946  WBC 13.4*  NEUTROABS 10.4*  HGB 15.9  HCT 47.0  MCV 90.9  PLT 202    HG/HCT   stable,      Component Value Date/Time   HGB 15.9 05/13/2022 0946   HCT 47.0 05/13/2022 0946   MCV 90.9 05/13/2022 0946     DM  labs:  HbA1C: Recent Labs    06/13/21 0822 10/22/21 1101 04/29/22 0917  HGBA1C 6.7* 6.4* 6.8*      CBG (last 3)  No results for input(s): "GLUCAP" in  the last 72 hours.        Cultures: No results found for: "SDES", "SPECREQUEST", "CULT", "REPTSTATUS"   Radiological Exams on Admission: CT HAND LEFT W CONTRAST  Result Date: 05/13/2022 CLINICAL DATA:  Soft tissue infection suspected, hand, no prior imaging animal bite, EXAM: CT OF THE UPPER LEFT EXTREMITY WITH CONTRAST TECHNIQUE: Multidetector CT imaging of the upper left extremity was performed according to the standard protocol following intravenous contrast administration. RADIATION DOSE REDUCTION: This exam was performed according to the departmental dose-optimization program which includes automated exposure control, adjustment of the mA and/or kV according to patient size and/or use of iterative reconstruction technique. CONTRAST:  59m OMNIPAQUE IOHEXOL 300 MG/ML  SOLN COMPARISON:  None Available. FINDINGS: Bones/Joint/Cartilage There is no evidence of acute fracture. There is severe first carpometacarpal joint osteoarthritis with bulky osteophyte formation, subchondral sclerosis and cystic change, and slight radial subluxation of the thumb metacarpal with respect to the trapezium. There is mild triscaphe joint osteoarthritis. Additional mild scattered osteoarthritis throughout the hand and wrist. There is no bony erosion or periostitis. Ligaments Suboptimally assessed by CT. Muscles and Tendons There is prominence and slight lower attenuation of the palmar thenar musculature, suggesting intramuscular edema. No evidence of intramuscular collection by CT. There is no evidence of tenosynovitis. Soft tissues Superficial soft tissue swelling along the radial hand/thenar eminence. No evidence of a well-defined/drainable fluid collection. No soft tissue gas. IMPRESSION: Superficial and intramuscular soft tissue swelling of the radial hand/thenar eminence. No evidence of soft tissue abscess or osseous changes to suggest osteomyelitis by CT. No evidence of tenosynovitis. No soft tissue gas. Severe thumb  carpometacarpal joint osteoarthritis. Electronically Signed   By: JMaurine SimmeringM.D.   On: 05/13/2022 11:57   _______________________________________________________________________________________________________ Latest  Blood pressure 126/83, pulse 96, temperature 98.2 F (36.8 C), temperature source Oral, resp. rate 15, height '5\' 11"'$  (1.803 m), weight 109.8 kg, SpO2 91 %.   Vitals  labs and radiology finding personally reviewed  Review of Systems:    Pertinent positives include:   chills, fatigue,   Constitutional:  No weight loss, night sweats, Fevers,weight loss  HEENT:  No headaches, Difficulty swallowing,Tooth/dental problems,Sore throat,  No sneezing, itching, ear ache, nasal congestion, post nasal drip,  Cardio-vascular:  No chest pain, Orthopnea, PND, anasarca, dizziness, palpitations.no Bilateral lower extremity swelling  GI:  No heartburn, indigestion, abdominal pain, nausea, vomiting, diarrhea, change in bowel habits, loss of appetite, melena, blood in stool, hematemesis Resp:  no shortness of breath at rest. No dyspnea on exertion, No excess mucus, no productive cough, No non-productive cough, No coughing up of blood.No change in color of mucus.No wheezing. Skin:  no rash or lesions. No jaundice GU:  no dysuria, change in color of urine, no urgency or frequency. No straining to urinate.  No flank pain.  Musculoskeletal:  No joint pain or no joint swelling. No decreased range of motion. No back pain.  Psych:  No change in mood or affect. No depression or anxiety. No memory loss.  Neuro: no localizing neurological complaints, no tingling, no weakness, no double vision, no gait abnormality, no slurred speech, no confusion  All systems reviewed and apart from [place] are negative _______________________________________________________________________________________________ Past Medical History:   Past Medical History:  Diagnosis Date   Arthritis    Bladder  cancer  [facility]) urologist-  dr Kipp Brood   Borderline diabetes    Bursitis of both shoulders    CAD (coronary artery disease), native coronary artery    Cath 03/2019 with 20% prox and 60% mid LAD stenosis >>medical management recommended   Chronic systolic CHF (congestive heart failure), NYHA class 2 (HCC)    EF 29% by cMRI>>improved to 50-55% by echo 02/2021   Diabetes mellitus without complication ([place])    Disorder of male genital organs, unspecified    Dyspnea    Esophagus disorder    Constricted Esophagus, per New Patient Packet-PSC    History of basal cell carcinoma (BCC) excision    05/ 2018  right forehead   HLD (hyperlipidemia)    Hypertension    Mild acid reflux no meds   Nocturia    OSA (obstructive sleep apnea)    per pt has not used cpap in years   Renal cyst, left 11/2011   16 mm   Sleep apnea    Per New Patient Packet-PSC    Smokers' cough ([place])    SOB (shortness of breath) on exertion     Past Surgical History:  Procedure Laterality Date   CYSTOSCOPY WITH BIOPSY  03/13/2012   Procedure: CYSTOSCOPY WITH BIOPSY;  Surgeon: Fredricka Bonine, MD;  Location: [facility];  Service: Urology;  Laterality: N/A;      ESOPHAGEAL DILATION  05/20/1986   Esophagus Stretch, per New Patient Packet-PSC    RIGHT/LEFT HEART CATH AND CORONARY ANGIOGRAPHY N/A 04/16/2019   Procedure: RIGHT/LEFT HEART CATH AND CORONARY ANGIOGRAPHY;  Surgeon: Troy Sine, MD;  Location: [facility];  Service: Cardiovascular;  Laterality: N/A;   SHOULDER ARTHROSCOPY WITH ROTATOR CUFF REPAIR Left 03/15/2021   Procedure: Left shoulder arthroscopy, debridement, subacromial decompression, distal clavicle resection, possible rotator cuff repair;  Surgeon: Justice Britain, MD;  Location: [facility];  Service: Orthopedics;  Laterality: Left;   TONSILLECTOMY  age 55   TRANSURETHRAL RESECTION OF BLADDER TUMOR N/A 05/23/2017   Procedure: TRANSURETHRAL RESECTION OF BLADDER TUMOR / (TURBT) WITH  INSTILLATION OF EPIRUBICIN;  Surgeon: [name], MD;  Location[facility];  Service: Urology;  Laterality: N/A;   UVULECTOMY  05/20/1990   Dr.Byers, per new patient packet    UVULOPALATOPHARYNGOPLASTY  2005   osa    Social History:  Ambulatory   independently     reports that he has been smoking cigarettes. He has a 23.50 pack-year smoking history. He quit smokeless tobacco use about 54 years ago.  His smokeless tobacco use included chew. He reports current alcohol use. He reports that he does not currently use drugs.    Family History:  Family History  Problem Relation Age of Onset   Diabetes Mother    Dementia Mother    Stroke Mother    Arthritis Mother    Hypertension Mother    Multiple sclerosis Father    Diabetes Father    Diabetes Sister    Hypertension Sister    ADD / ADHD Son    Migraines Daughter    ADD / ADHD Daughter    Cancer Maternal Grandmother    ______________________________________________________________________________________________ Allergies: Allergies  Allergen Reactions   Chantix [Varenicline Tartrate]     Aggression      Prior to Admission medications   Medication Sig Start Date End Date Taking? Authorizing Provider  ACCU-CHEK GUIDE test strip USE TO TEST BLOOD SUGAR DAILY. 04/21/20   Lauree Chandler, NP  Accu-Chek  Softclix Lancets lancets USE TO TEST BLOOD SUGAR DAILY. 04/29/22   Lauree Chandler, NP  acetaminophen (TYLENOL) 500 MG tablet Take 2 tablets (1,000 mg total) by mouth every 8 (eight) hours as needed for moderate pain. 08/13/19   Darr, Edison Nasuti, PA-C  albuterol (VENTOLIN HFA) 108 (90 Base) MCG/ACT inhaler INHALE 2 PUFFS INTO THE LUNGS EVERY 6 HOURS AS NEEDED FOR WHEEZING OR SHORTNESS OF BREATH. 04/22/22   Lauree Chandler, NP  aspirin 81 MG chewable tablet Chew 1 tablet (81 mg total) by mouth daily. 04/19/19   Mercy Riding, MD  atorvastatin (LIPITOR) 80 MG tablet TAKE ONE TABLET BY MOUTH ONCE DAILY AT 6PM.  08/08/21   Sueanne Margarita, MD  carisoprodol (SOMA) 350 MG tablet Take 1 tablet (350 mg total) by mouth at bedtime as needed for muscle spasms. 04/29/22   Lauree Chandler, NP  dapagliflozin propanediol (FARXIGA) 10 MG TABS tablet Take 1 tablet (10 mg total) by mouth daily before breakfast. 08/08/21   Turner, Eber Hong, MD  Dulaglutide (TRULICITY) 1.95 KD/3.2IZ SOPN INJECT 1 SYRINGE (50 MG) SUBCUTANEOUSLY ONCE A WEEK (ON THE SAME DAY OF EACH WEEK) AS DIRECTED. 10/02/21   Lauree Chandler, NP  furosemide (LASIX) 20 MG tablet Take 1 tablet (20 mg total) by mouth daily. 08/08/21   Sueanne Margarita, MD  glipiZIDE (GLUCOTROL) 5 MG tablet TAKE (1) TABLET BY MOUTH TWICE DAILY. 08/08/21   Lauree Chandler, NP  Glucosamine-Chondroitin-MSM (GLUCOS-CHOND-MSM-DOUBLE STR PO) Take 1 tablet by mouth daily.    [provider]  levocetirizine (XYZAL) 5 MG tablet Take 5 mg by mouth daily as needed for allergies. 09/07/20   [provider]  metFORMIN (GLUCOPHAGE) 1000 MG tablet TAKE (1) TABLET BY MOUTH TWICE A DAY WITH MEALS (BREAKFAST AND SUPPER). 04/16/21   Lauree Chandler, NP  metoprolol succinate (TOPROL XL) 100 MG 24 hr tablet Take 1 tablet (100 mg total) by mouth daily. Take with or immediately following a meal. 08/08/21   Turner, Eber Hong, MD  metoprolol succinate (TOPROL XL) 25 MG 24 hr tablet Take 1 tablet (25 mg total) by mouth daily. 08/08/21   Sueanne Margarita, MD  Multiple Vitamin (MULTIVITAMIN WITH MINERALS) TABS tablet Take 1 tablet by mouth daily. 04/19/19   Mercy Riding, MD  oxyCODONE-acetaminophen (PERCOCET) 5-325 MG tablet Take 1 tablet by mouth every 4 (four) hours as needed (max 6 q). 03/15/21   Shuford, Olivia Mackie, PA-C  potassium chloride SA (KLOR-CON M) 20 MEQ tablet Take 2 tablets (40 mEq total) by mouth daily. 08/08/21   Sueanne Margarita, MD  predniSONE (STERAPRED UNI-PAK 21 TAB) 10 MG (21) TBPK tablet Use as directed 04/29/22   Lauree Chandler, NP  sacubitril-valsartan (ENTRESTO)  97-103 MG Take 1 tablet by mouth 2 (two) times daily. 08/08/21   Sueanne Margarita, MD  spironolactone (ALDACTONE) 25 MG tablet TAKE (1) TABLET BY MOUTH ONCE DAILY. 08/08/21   Sueanne Margarita, MD  umeclidinium-vilanterol (ANORO ELLIPTA) 62.5-25 MCG/ACT AEPB INHALE 1 PUFF INTO THE LUNGS ONCE DAILY. 04/22/22   Lauree Chandler, NP    ___________________________________________________________________________________________________ Physical Exam:    05/13/2022   10:55 PM 05/13/2022   10:00 PM 05/13/2022    9:45 PM  Vitals with BMI  Systolic 124 580 998  Diastolic 83 87 76  Pulse 96 86 89     1. General:  in No  Acute distress   Chronically ill   -appearing 2. Psychological: Alert and  Oriented 3. Head/ENT:    Dry Mucous Membranes                          Head Non traumatic, neck supple                         Poor Dentition 4. SKIN decreased Skin turgor,  Skin clean Dry and intact      Repeat images:       5. Heart: Regular rate and rhythm mild  Murmur, no Rub or gallop 6. Lungs , no wheezes or crackles   7. Abdomen: Soft,  non-tender, Non distended  bowel sounds present 8. Lower extremities: no clubbing, cyanosis, no  edema 9. Neurologically Grossly intact, moving all 4 extremities equally   10. MSK: Normal range of motion    Chart has been reviewed  ______________________________________________________________________________________________  Assessment/Plan  71 y.o. male with medical history significant of Dm2, HTN , HLD, CHF systolic, bladder cancer, tobacco abuse   Admitted for   Animal bite  Cellulitis of left upper extremity     Present on Admission:  Dog bite  Essential hypertension  OSA (obstructive sleep apnea)  Type 2 diabetes mellitus with hyperglycemia, without long-term current use of insulin (HCC)  COPD (chronic obstructive pulmonary disease) (HCC)  Chronic systolic heart failure (HCC)  Atherosclerosis of native coronary artery of native heart  with angina pectoris (HCC)  HLD (hyperlipidemia)  Cellulitis of left hand  Tobacco abuse     Dog bite Dog is fully vaccinated Tdap administered at [place].  Treat with Unasyn  Essential hypertension Continue Toprol 100 mg in the morning and 25 mg at night   OSA (obstructive sleep apnea) Not compliant with CPAP  Hx of bladder cancer Chronic stable followed by urologist  Type 2 diabetes mellitus with hyperglycemia, without long-term current use of insulin (HCC) Hold metformin and glipizide order sliding scale  COPD (chronic obstructive pulmonary disease) (HCC) Chronic stable continue home medications  Chronic systolic heart failure (HCC) Currently slightly on a dry side.  Continue Entresto spironolactone 25 mg p.o. once a day hold Lasix for tonight gently rehydrate and follow fluid status  Atherosclerosis of native coronary artery of native heart with angina pectoris (HCC) Hold aspirin for tonight given possibility of needing intervention at some point.  Continue Lipitor 80 mg p.o. daily continue Toprol 100 the morning 25 milligrams at night  HLD (hyperlipidemia) Continue Lipitor 80 mg p.o. daily  Cellulitis of left hand In the setting of dog bite.  Continue Unasyn.  Imaging showed no evidence of abscess or free air but given that the swelling has progressed despite p.o. antibiotics IV antibiotics was started patient still endorses significant swelling but able to move his thumb Recommend elevation Discussed case with hand surgery who will see patient in consult tomorrow  Tobacco abuse  - Spoke about importance of quitting spent 5 minutes discussing options for treatment, prior attempts at quitting, and dangers of smoking  -At this point patient is    interested in quitting  - order nicotine patch   - nursing tobacco cessation protocol    Other plan as per orders.  DVT prophylaxis:  SCD     Code Status:    Code Status: Prior FULL CODE as per patient   I had  personally discussed CODE STATUS with patient and family   Family Communication:   Family  at  Bedside  plan of care  was discussed on the phone with  Wife,    Disposition Plan:   To home once workup is complete and patient is stable   Following barriers for discharge:                                                       Will need consultants to evaluate patient prior to discharge                      Diabetes care coordinator                                      Consults called:   Hand surgery  Dr. Burney Gauze  Admission status:  ED Disposition     ED Disposition  Admit   Condition  --   [place]: [facility] [[id number]]  Level of Care: Med-Surg [16]  Interfacility transfer: Yes  May place patient in observation at [facility] or [place] if equivalent level of care is available:: No  Covid Evaluation: Asymptomatic - no recent exposure (last 10 days) testing not required  Diagnosis: Dog bite [[id number]]  Admitting Physician: Loree Fee  Attending Physician: Oswald Hillock [[id number]]          Obs     Level of care       medical floor     Timothy Lee 05/14/2022, 1:34 AM    [facility]     after 2 AM please page floor coverage PA If 7AM-7PM, please contact the day team taking care of the patient using [email or web address]   Patient was evaluated in the context of the global COVID-19 pandemic, which necessitated consideration that the patient might be at risk for infection with the SARS-CoV-2 virus that causes COVID-19. Institutional protocols and algorithms that pertain to the evaluation of patients at risk for COVID-19 are in a state of rapid change based on information released by regulatory bodies including the CDC and federal and state organizations. These policies and algorithms were followed during the patient's care.

## 2022-05-13 NOTE — ED Provider Notes (Signed)
[facility] EMERGENCY DEPT Provider Note   CSN: [id number] Arrival date & time: 05/13/22  7628     History  Chief Complaint  Patient presents with   Animal Bite   Wound Check    Timothy Lee is a 71 y.o. male with a past medical history of diabetes, who presents emergency department with concerns for animal bite onset 3 days.  Notes that he was bit by a dog and started on amoxicillin prescription that was started by his wife who is a Pharmacist, community.  He was not evaluated for his animal bite.  Notes that his dog is blind and was startled when he went to touch him and bit him.  The dog is up-to-date with his vaccines. Has associated left hand swelling, redness, drainage.  Notes that the redness has started to spread up his left arm.  Denies chest pain, shortness of breath, fever.   The history is provided by the patient. No language interpreter was used.       Home Medications Prior to Admission medications   Medication Sig Start Date End Date Taking? Authorizing Provider  ACCU-CHEK GUIDE test strip USE TO TEST BLOOD SUGAR DAILY. 04/21/20   Lauree Chandler, NP  Accu-Chek Softclix Lancets lancets USE TO TEST BLOOD SUGAR DAILY. 04/29/22   Lauree Chandler, NP  acetaminophen (TYLENOL) 500 MG tablet Take 2 tablets (1,000 mg total) by mouth every 8 (eight) hours as needed for moderate pain. 08/13/19   Darr, Edison Nasuti, PA-C  albuterol (VENTOLIN HFA) 108 (90 Base) MCG/ACT inhaler INHALE 2 PUFFS INTO THE LUNGS EVERY 6 HOURS AS NEEDED FOR WHEEZING OR SHORTNESS OF BREATH. 04/22/22   Lauree Chandler, NP  aspirin 81 MG chewable tablet Chew 1 tablet (81 mg total) by mouth daily. 04/19/19   Mercy Riding, MD  atorvastatin (LIPITOR) 80 MG tablet TAKE ONE TABLET BY MOUTH ONCE DAILY AT 6PM. 08/08/21   Sueanne Margarita, MD  carisoprodol (SOMA) 350 MG tablet Take 1 tablet (350 mg total) by mouth at bedtime as needed for muscle spasms. 04/29/22   Lauree Chandler, NP  dapagliflozin  propanediol (FARXIGA) 10 MG TABS tablet Take 1 tablet (10 mg total) by mouth daily before breakfast. 08/08/21   Turner, Eber Hong, MD  Dulaglutide (TRULICITY) 3.15 VV/6.1YW SOPN INJECT 1 SYRINGE (50 MG) SUBCUTANEOUSLY ONCE A WEEK (ON THE SAME DAY OF EACH WEEK) AS DIRECTED. 10/02/21   Lauree Chandler, NP  furosemide (LASIX) 20 MG tablet Take 1 tablet (20 mg total) by mouth daily. 08/08/21   Sueanne Margarita, MD  glipiZIDE (GLUCOTROL) 5 MG tablet TAKE (1) TABLET BY MOUTH TWICE DAILY. 08/08/21   Lauree Chandler, NP  Glucosamine-Chondroitin-MSM (GLUCOS-CHOND-MSM-DOUBLE STR PO) Take 1 tablet by mouth daily.    [provider]  levocetirizine (XYZAL) 5 MG tablet Take 5 mg by mouth daily as needed for allergies. 09/07/20   [provider]  metFORMIN (GLUCOPHAGE) 1000 MG tablet TAKE (1) TABLET BY MOUTH TWICE A DAY WITH MEALS (BREAKFAST AND SUPPER). 04/16/21   Lauree Chandler, NP  metoprolol succinate (TOPROL XL) 100 MG 24 hr tablet Take 1 tablet (100 mg total) by mouth daily. Take with or immediately following a meal. 08/08/21   Turner, Eber Hong, MD  metoprolol succinate (TOPROL XL) 25 MG 24 hr tablet Take 1 tablet (25 mg total) by mouth daily. 08/08/21   Sueanne Margarita, MD  Multiple Vitamin (MULTIVITAMIN WITH MINERALS) TABS tablet Take 1 tablet by mouth daily.  04/19/19   Mercy Riding, MD  oxyCODONE-acetaminophen (PERCOCET) 5-325 MG tablet Take 1 tablet by mouth every 4 (four) hours as needed (max 6 q). 03/15/21   Shuford, Olivia Mackie, PA-C  potassium chloride SA (KLOR-CON M) 20 MEQ tablet Take 2 tablets (40 mEq total) by mouth daily. 08/08/21   Sueanne Margarita, MD  predniSONE (STERAPRED UNI-PAK 21 TAB) 10 MG (21) TBPK tablet Use as directed 04/29/22   Lauree Chandler, NP  sacubitril-valsartan (ENTRESTO) 97-103 MG Take 1 tablet by mouth 2 (two) times daily. 08/08/21   Sueanne Margarita, MD  spironolactone (ALDACTONE) 25 MG tablet TAKE (1) TABLET BY MOUTH ONCE DAILY. 08/08/21   Sueanne Margarita, MD   umeclidinium-vilanterol (ANORO ELLIPTA) 62.5-25 MCG/ACT AEPB INHALE 1 PUFF INTO THE LUNGS ONCE DAILY. 04/22/22   Lauree Chandler, NP      Allergies    Chantix [varenicline tartrate]    Review of Systems   Review of Systems  All other systems reviewed and are negative.   Physical Exam Updated Vital Signs BP 120/88 (BP Location: Right Arm)   Pulse 92   Temp 98.1 F (36.7 C) (Oral)   Resp 18   Ht '5\' 11"'$  (1.803 m)   Wt 109.8 kg   SpO2 97%   BMI 33.76 kg/m  Physical Exam Vitals and nursing note reviewed.  Constitutional:      General: He is not in acute distress.    Appearance: He is not diaphoretic.  HENT:     Head: Normocephalic and atraumatic.     Mouth/Throat:     Pharynx: No oropharyngeal exudate.  Eyes:     General: No scleral icterus.    Conjunctiva/sclera: Conjunctivae normal.  Cardiovascular:     Rate and Rhythm: Normal rate and regular rhythm.     Pulses: Normal pulses.     Heart sounds: Normal heart sounds.  Pulmonary:     Effort: Pulmonary effort is normal. No respiratory distress.     Breath sounds: Normal breath sounds. No wheezing.  Abdominal:     General: Bowel sounds are normal.     Palpations: Abdomen is soft. There is no mass.     Tenderness: There is no abdominal tenderness. There is no guarding or rebound.  Musculoskeletal:        General: Normal range of motion.     Cervical back: Normal range of motion and neck supple.     Comments: Erythema noted to the left thenar eminence with swelling and increased warmth noted to the area.  Erythema streaking on the forearm with increased warmth noted to the area.  No erythema or increased warmth or swelling noted to the wrist.  Full flexion and extension of left thumb against resistance.  Capillary refill less than 2 seconds.  Sensation intact.  Puncture wound noted to the dorsal aspect of the left hand at the base of the left thumb, no drainage at this time.  Skin:    General: Skin is warm and dry.   Neurological:     Mental Status: He is alert.  Psychiatric:        Behavior: Behavior normal.        ED Results / Procedures / Treatments   Labs (all labs ordered are listed, but only abnormal results are displayed) Labs Reviewed  LACTIC ACID, PLASMA - Abnormal; Notable for the following components:      Result Value   Lactic Acid, Venous 2.3 (*)    All other components within  normal limits  COMPREHENSIVE METABOLIC PANEL - Abnormal; Notable for the following components:   CO2 21 (*)    Glucose, Bld 216 (*)    AST 12 (*)    Total Bilirubin 1.3 (*)    All other components within normal limits  CBC WITH DIFFERENTIAL/PLATELET - Abnormal; Notable for the following components:   WBC 13.4 (*)    Neutro Abs 10.4 (*)    All other components within normal limits  CULTURE, BLOOD (ROUTINE X 2)  CULTURE, BLOOD (ROUTINE X 2)  LACTIC ACID, PLASMA    EKG None  Radiology CT HAND LEFT W CONTRAST  Result Date: 05/13/2022 CLINICAL DATA:  Soft tissue infection suspected, hand, no prior imaging animal bite, EXAM: CT OF THE UPPER LEFT EXTREMITY WITH CONTRAST TECHNIQUE: Multidetector CT imaging of the upper left extremity was performed according to the standard protocol following intravenous contrast administration. RADIATION DOSE REDUCTION: This exam was performed according to the departmental dose-optimization program which includes automated exposure control, adjustment of the mA and/or kV according to patient size and/or use of iterative reconstruction technique. CONTRAST:  1m OMNIPAQUE IOHEXOL 300 MG/ML  SOLN COMPARISON:  None Available. FINDINGS: Bones/Joint/Cartilage There is no evidence of acute fracture. There is severe first carpometacarpal joint osteoarthritis with bulky osteophyte formation, subchondral sclerosis and cystic change, and slight radial subluxation of the thumb metacarpal with respect to the trapezium. There is mild triscaphe joint osteoarthritis. Additional mild  scattered osteoarthritis throughout the hand and wrist. There is no bony erosion or periostitis. Ligaments Suboptimally assessed by CT. Muscles and Tendons There is prominence and slight lower attenuation of the palmar thenar musculature, suggesting intramuscular edema. No evidence of intramuscular collection by CT. There is no evidence of tenosynovitis. Soft tissues Superficial soft tissue swelling along the radial hand/thenar eminence. No evidence of a well-defined/drainable fluid collection. No soft tissue gas. IMPRESSION: Superficial and intramuscular soft tissue swelling of the radial hand/thenar eminence. No evidence of soft tissue abscess or osseous changes to suggest osteomyelitis by CT. No evidence of tenosynovitis. No soft tissue gas. Severe thumb carpometacarpal joint osteoarthritis. Electronically Signed   By: JMaurine SimmeringM.D.   On: 05/13/2022 11:57    Procedures Procedures    Medications Ordered in ED Medications  sodium chloride 0.9 % bolus 1,000 mL (0 mLs Intravenous Stopped 05/13/22 1231)  ampicillin-sulbactam (UNASYN) 1.5 g in sodium chloride 0.9 % 100 mL IVPB (0 g Intravenous Stopped 05/13/22 1207)  iohexol (OMNIPAQUE) 300 MG/ML solution 80 mL (80 mLs Intravenous Contrast Given 05/13/22 1120)  Tdap (BOOSTRIX) injection 0.5 mL (0.5 mLs Intramuscular Given 05/13/22 1210)    ED Course/ Medical Decision Making/ A&P Clinical Course as of 05/13/22 1835  Mon May 13, 2022  1307 Pt re-eval [SB]  1446 [facility] Dr[facility]    Clinical Course User Index [SB] Collins Dimaria A, PA-C                           Medical Decision Making Amount and/or Complexity of Data Reviewed Labs: ordered. Radiology: ordered.  Risk Prescription drug management. Decision regarding hospitalization.   Pt presents with dog bite to left hand onset 3 days. Dog is UTD with immunizations.  Patient has been taking amoxicillin. On exam, patient with Erythema noted to the left thenar  eminence with swelling and increased warmth noted to the area.  Erythema streaking on the forearm with increased warmth noted to the area.  No  erythema or increased warmth or swelling noted to the wrist.  Full flexion and extension of left thumb against resistance.  Capillary refill less than 2 seconds.  Sensation intact.  Puncture wound noted to the dorsal aspect of the left hand at the base of the left thumb, no drainage at this time. Differential diagnosis includes animal bite, foreign body, fracture, dislocation, cellulitis, abscess.   Labs:  I ordered, and personally interpreted labs.  The pertinent results include:   Initial lactic elevated at 2.3, repeat lactic 1.1 Blood cultures ordered with results pending. CMP with elevated glucose at 216 otherwise unremarkable. CBC with leukocytosis at 13.4  Imaging: I ordered imaging studies including CT left hand I independently visualized and interpreted imaging which showed:  Superficial and intramuscular soft tissue swelling of the radial  hand/thenar eminence. No evidence of soft tissue abscess or osseous  changes to suggest osteomyelitis by CT. No evidence of  tenosynovitis. No soft tissue gas.    Severe thumb carpometacarpal joint osteoarthritis.   I agree with the radiologist interpretation  Medications:  I ordered medication including unasyn and tetanus vaccine for prophylaxis I have reviewed the patients home medicines and have made adjustments as needed   Consultations: I requested consultation with the Hospitalist, Dr. Darrick Meigs and discussed lab and imaging findings as well as pertinent plan - they recommend: Admission at this time   Disposition: Presentation concerning for animal bite, cellulitis, lymphangitis.  Doubt at this time concerns for foreign body or fracture or appreciable abscess.  Tetanus updated in the ED.  After consideration of the diagnostic results and the patients response to treatment, I feel that the patient  would benefit from Admission to the hospital.  Discussed with patient plans for admission.  Patient agreeable at this time.  Patient appears safe for admission.   This chart was dictated using voice recognition software, Dragon. Despite the best efforts of this provider to proofread and correct errors, errors may still occur which can change documentation meaning.   Final Clinical Impression(s) / ED Diagnoses Final diagnoses:  Animal bite  Cellulitis of left upper extremity    Rx / DC Orders ED Discharge Orders     None         Coni Homesley A, PA-C 05/13/22 1835    Elgie Congo, MD 05/14/22 [phone]

## 2022-05-13 NOTE — ED Notes (Signed)
15:00 Timothy Lee will send transport asap. They are short staffed and it could be a couple of hrs.-ABB(NS)

## 2022-05-13 NOTE — ED Notes (Signed)
Handoff report given to Wilmon Arms RN on 3W at [facility]

## 2022-05-13 NOTE — Assessment & Plan Note (Signed)
In the setting of dog bite.  Continue Unasyn.  Imaging showed no evidence of abscess or free air but given that the swelling has progressed despite p.o. antibiotics IV antibiotics was started patient still endorses significant swelling but able to move his thumb Recommend elevation Discussed case with hand surgery who will see patient in consult tomorrow

## 2022-05-13 NOTE — Assessment & Plan Note (Signed)
Continue Toprol 100 mg in the morning and 25 mg at night

## 2022-05-13 NOTE — Assessment & Plan Note (Signed)
Continue Lipitor 80 mg p.o. daily 

## 2022-05-13 NOTE — Subjective & Objective (Signed)
Was bit by a dog on his left hand 3 days ago and was started on oral antibiotics ( amoxicillin) it is his own dog and is up-to-date with his vaccines  Pt is diabetic reports left hand swelling, redness, drainage.  Redness spreading up the left arm   No fever or chill no CP or SOB

## 2022-05-13 NOTE — Assessment & Plan Note (Signed)
Currently slightly on a dry side.  Continue Entresto spironolactone 25 mg p.o. once a day hold Lasix for tonight gently rehydrate and follow fluid status

## 2022-05-13 NOTE — Assessment & Plan Note (Signed)
Hold metformin and glipizide order sliding scale

## 2022-05-13 NOTE — Assessment & Plan Note (Signed)
Dog is fully vaccinated Tdap administered at [place].  Treat with Unasyn

## 2022-05-13 NOTE — Assessment & Plan Note (Signed)
Not compliant with CPAP

## 2022-05-13 NOTE — Assessment & Plan Note (Signed)
Chronic stable followed by urologist

## 2022-05-14 ENCOUNTER — Encounter (HOSPITAL_COMMUNITY): Payer: Self-pay | Admitting: Internal Medicine

## 2022-05-14 DIAGNOSIS — Z888 Allergy status to other drugs, medicaments and biological substances status: Secondary | ICD-10-CM | POA: Diagnosis not present

## 2022-05-14 DIAGNOSIS — W540XXA Bitten by dog, initial encounter: Secondary | ICD-10-CM

## 2022-05-14 DIAGNOSIS — Z7985 Long-term (current) use of injectable non-insulin antidiabetic drugs: Secondary | ICD-10-CM | POA: Diagnosis not present

## 2022-05-14 DIAGNOSIS — I10 Essential (primary) hypertension: Secondary | ICD-10-CM | POA: Diagnosis not present

## 2022-05-14 DIAGNOSIS — S61452A Open bite of left hand, initial encounter: Secondary | ICD-10-CM | POA: Diagnosis not present

## 2022-05-14 DIAGNOSIS — E785 Hyperlipidemia, unspecified: Secondary | ICD-10-CM | POA: Diagnosis present

## 2022-05-14 DIAGNOSIS — G4733 Obstructive sleep apnea (adult) (pediatric): Secondary | ICD-10-CM | POA: Diagnosis present

## 2022-05-14 DIAGNOSIS — Z91199 Patient's noncompliance with other medical treatment and regimen due to unspecified reason: Secondary | ICD-10-CM | POA: Diagnosis not present

## 2022-05-14 DIAGNOSIS — J4489 Other specified chronic obstructive pulmonary disease: Secondary | ICD-10-CM | POA: Diagnosis present

## 2022-05-14 DIAGNOSIS — L03114 Cellulitis of left upper limb: Principal | ICD-10-CM

## 2022-05-14 DIAGNOSIS — Z7952 Long term (current) use of systemic steroids: Secondary | ICD-10-CM | POA: Diagnosis not present

## 2022-05-14 DIAGNOSIS — T148XXA Other injury of unspecified body region, initial encounter: Secondary | ICD-10-CM | POA: Diagnosis present

## 2022-05-14 DIAGNOSIS — Z8551 Personal history of malignant neoplasm of bladder: Secondary | ICD-10-CM | POA: Diagnosis not present

## 2022-05-14 DIAGNOSIS — Z716 Tobacco abuse counseling: Secondary | ICD-10-CM | POA: Diagnosis not present

## 2022-05-14 DIAGNOSIS — I110 Hypertensive heart disease with heart failure: Secondary | ICD-10-CM | POA: Diagnosis present

## 2022-05-14 DIAGNOSIS — L089 Local infection of the skin and subcutaneous tissue, unspecified: Secondary | ICD-10-CM | POA: Diagnosis not present

## 2022-05-14 DIAGNOSIS — Z8249 Family history of ischemic heart disease and other diseases of the circulatory system: Secondary | ICD-10-CM | POA: Diagnosis not present

## 2022-05-14 DIAGNOSIS — Z23 Encounter for immunization: Secondary | ICD-10-CM | POA: Diagnosis present

## 2022-05-14 DIAGNOSIS — Z7951 Long term (current) use of inhaled steroids: Secondary | ICD-10-CM | POA: Diagnosis not present

## 2022-05-14 DIAGNOSIS — F17210 Nicotine dependence, cigarettes, uncomplicated: Secondary | ICD-10-CM | POA: Diagnosis present

## 2022-05-14 DIAGNOSIS — I5022 Chronic systolic (congestive) heart failure: Secondary | ICD-10-CM | POA: Diagnosis not present

## 2022-05-14 DIAGNOSIS — Z79899 Other long term (current) drug therapy: Secondary | ICD-10-CM | POA: Diagnosis not present

## 2022-05-14 DIAGNOSIS — Z833 Family history of diabetes mellitus: Secondary | ICD-10-CM | POA: Diagnosis not present

## 2022-05-14 DIAGNOSIS — Z7982 Long term (current) use of aspirin: Secondary | ICD-10-CM | POA: Diagnosis not present

## 2022-05-14 DIAGNOSIS — I25119 Atherosclerotic heart disease of native coronary artery with unspecified angina pectoris: Secondary | ICD-10-CM | POA: Diagnosis present

## 2022-05-14 DIAGNOSIS — W540XXD Bitten by dog, subsequent encounter: Secondary | ICD-10-CM | POA: Diagnosis not present

## 2022-05-14 DIAGNOSIS — Z7984 Long term (current) use of oral hypoglycemic drugs: Secondary | ICD-10-CM | POA: Diagnosis not present

## 2022-05-14 DIAGNOSIS — E1165 Type 2 diabetes mellitus with hyperglycemia: Secondary | ICD-10-CM | POA: Diagnosis present

## 2022-05-14 LAB — CBC
HCT: 44 % (ref 39.0–52.0)
HCT: 44.6 % (ref 39.0–52.0)
Hemoglobin: 14.5 g/dL (ref 13.0–17.0)
Hemoglobin: 15.1 g/dL (ref 13.0–17.0)
MCH: 30.7 pg (ref 26.0–34.0)
MCH: 31.1 pg (ref 26.0–34.0)
MCHC: 33 g/dL (ref 30.0–36.0)
MCHC: 33.9 g/dL (ref 30.0–36.0)
MCV: 93.2 fL (ref 80.0–100.0)
MCV: 92 fL (ref 80.0–100.0)
Platelets: 186 10*3/uL (ref 150–400)
Platelets: 223 10*3/uL (ref 150–400)
RBC: 4.72 MIL/uL (ref 4.22–5.81)
RBC: 4.85 MIL/uL (ref 4.22–5.81)
RDW: 12.8 % (ref 11.5–15.5)
RDW: 12.8 % (ref 11.5–15.5)
WBC: 12.5 10*3/uL — ABNORMAL HIGH (ref 4.0–10.5)
WBC: 13 10*3/uL — ABNORMAL HIGH (ref 4.0–10.5)
nRBC: 0 % (ref 0.0–0.2)
nRBC: 0 % (ref 0.0–0.2)

## 2022-05-14 LAB — GLUCOSE, CAPILLARY
Glucose-Capillary: 127 mg/dL — ABNORMAL HIGH (ref 70–99)
Glucose-Capillary: 129 mg/dL — ABNORMAL HIGH (ref 70–99)
Glucose-Capillary: 130 mg/dL — ABNORMAL HIGH (ref 70–99)
Glucose-Capillary: 148 mg/dL — ABNORMAL HIGH (ref 70–99)
Glucose-Capillary: 169 mg/dL — ABNORMAL HIGH (ref 70–99)

## 2022-05-14 LAB — COMPREHENSIVE METABOLIC PANEL
ALT: 24 U/L (ref 0–44)
AST: 15 U/L (ref 15–41)
Albumin: 3.6 g/dL (ref 3.5–5.0)
Alkaline Phosphatase: 43 U/L (ref 38–126)
Anion gap: 9 (ref 5–15)
BUN: 13 mg/dL (ref 8–23)
CO2: 21 mmol/L — ABNORMAL LOW (ref 22–32)
Calcium: 8.9 mg/dL (ref 8.9–10.3)
Chloride: 108 mmol/L (ref 98–111)
Creatinine, Ser: 0.8 mg/dL (ref 0.61–1.24)
GFR, Estimated: >60 mL/min (ref >60)
Glucose, Bld: 124 mg/dL — ABNORMAL HIGH (ref 70–99)
Potassium: 3.7 mmol/L (ref 3.5–5.1)
Sodium: 138 mmol/L (ref 135–145)
Total Bilirubin: 1.6 mg/dL — ABNORMAL HIGH (ref 0.3–1.2)
Total Protein: 5.9 g/dL — ABNORMAL LOW (ref 6.5–8.1)

## 2022-05-14 LAB — T4, FREE: Free T4: 0.95 ng/dL (ref 0.61–1.12)

## 2022-05-14 LAB — SEDIMENTATION RATE: Sed Rate: 5 mm/hr (ref 0–16)

## 2022-05-14 LAB — HIV ANTIBODY (ROUTINE TESTING W REFLEX): HIV Screen 4th Generation wRfx: NONREACTIVE

## 2022-05-14 LAB — TSH: TSH: 0.895 u[IU]/mL (ref 0.350–4.500)

## 2022-05-14 LAB — PHOSPHORUS: Phosphorus: 3.1 mg/dL (ref 2.5–4.6)

## 2022-05-14 LAB — CK: Total CK: 101 U/L (ref 49–397)

## 2022-05-14 LAB — C-REACTIVE PROTEIN: CRP: 3.5 mg/dL — ABNORMAL HIGH (ref <1.0)

## 2022-05-14 LAB — MAGNESIUM: Magnesium: 2.2 mg/dL (ref 1.7–2.4)

## 2022-05-14 MED ORDER — AMOXICILLIN-POT CLAVULANATE 875-125 MG PO TABS: 1.0000 | ORAL_TABLET | Freq: Two times a day (BID) | ORAL | 0 refills | Status: DC

## 2022-05-14 MED ORDER — INSULIN ASPART 100 UNIT/ML IJ SOLN
0.0000 [IU] | Freq: Three times a day (TID) | INTRAMUSCULAR | Status: DC
  Administered 2022-05-14: 2 [IU] via SUBCUTANEOUS
  Administered 2022-05-14: 1 [IU] via SUBCUTANEOUS
  Administered 2022-05-15: 2 [IU] via SUBCUTANEOUS
  Administered 2022-05-15: 1 [IU] via SUBCUTANEOUS

## 2022-05-14 MED ORDER — NICOTINE 7 MG/24HR TD PT24
7.0000 mg | MEDICATED_PATCH | Freq: Every day | TRANSDERMAL | Status: DC
  Administered 2022-05-14 – 2022-05-15 (×2): 7 mg via TRANSDERMAL
  Filled 2022-05-14 (×2): qty 1

## 2022-05-14 NOTE — Assessment & Plan Note (Signed)
Counseled on importance of cessation. Nicotine patch ordered.

## 2022-05-14 NOTE — Progress Notes (Signed)
Pt soaked left hand in warm soapy water

## 2022-05-14 NOTE — Assessment & Plan Note (Signed)
Sliding scale insulin for now. Holding home metformin, glipizide

## 2022-05-14 NOTE — Assessment & Plan Note (Signed)
See dog bite

## 2022-05-14 NOTE — Assessment & Plan Note (Signed)
Stable. Continue home regimen 

## 2022-05-14 NOTE — Plan of Care (Signed)
Problem: Education: Goal: Knowledge of General Education information will improve Description: Including pain rating scale, medication(s)/side effects and non-pharmacologic comfort measures Outcome: Progressing   Problem: Health Behavior/Discharge Planning: Goal: Ability to manage health-related needs will improve Outcome: Progressing   Problem: Clinical Measurements: Goal: Ability to maintain clinical measurements within normal limits will improve Outcome: [place], RN 05/14/22 10:06 AM

## 2022-05-14 NOTE — TOC CM/SW Note (Signed)
Transition of Care [facility] Screening Note  Patient Details  Name: Timothy Lee Date of Birth: 12-03-1950  Transition of Care [facility]) CM/SW Contact:    Sherie Don, LCSW Phone Number: 05/14/2022, 9:25 AM  Transition of Care Department [facility]) has reviewed patient and no TOC needs have been identified at this time. We will continue to monitor patient advancement through interdisciplinary progression rounds. If new patient transition needs arise, please place a TOC consult.

## 2022-05-14 NOTE — Progress Notes (Signed)
Pharmacy Antibiotic Note  Timothy Lee is a 71 y.o. male admitted on 05/13/2022 with dog bite.  Despite being on amoxil x 3 days his left hand is swelling, has redness and drainage.  Pharmacy has been consulted for unasyn dosing.  Plan: Unasyn 3gm IV q6h Follow renal function and clinical course  Height: '5\' 11"'$  (180.3 cm) Weight: 109.8 kg (242 lb 1 oz) IBW/kg (Calculated) : 75.3  Temp (24hrs), Avg:98.2 F (36.8 C), Min:98.1 F (36.7 C), Max:98.3 F (36.8 C)  Recent Labs  Lab 05/13/22 0946 05/13/22 1146  WBC 13.4*  --   CREATININE 0.90  --   LATICACIDVEN 2.3* 1.1    Estimated Creatinine Clearance: 96.3 mL/min (by C-G formula based on SCr of 0.9 mg/dL).    Allergies  Allergen Reactions   Chantix [Varenicline Tartrate]     Aggression      Thank you for allowing pharmacy to be a part of this patient's care. Dolly Rias RPh 05/14/2022, 12:56 AM

## 2022-05-14 NOTE — Assessment & Plan Note (Signed)
See dog bit

## 2022-05-14 NOTE — Assessment & Plan Note (Signed)
No acute issues.

## 2022-05-14 NOTE — Plan of Care (Signed)
  Problem: Education: Goal: Knowledge of General Education information will improve Description: Including pain rating scale, medication(s)/side effects and non-pharmacologic comfort measures Outcome: Progressing   Problem: Health Behavior/Discharge Planning: Goal: Ability to manage health-related needs will improve Outcome: Progressing   Problem: Nutrition: Goal: Adequate nutrition will be maintained Outcome: Completed/Met   Problem: Pain Managment: Goal: General experience of comfort will improve Outcome: Progressing

## 2022-05-14 NOTE — Assessment & Plan Note (Signed)
-   Continue home meds °

## 2022-05-14 NOTE — Progress Notes (Signed)
Progress Note   Patient: Timothy Lee YPP:[id number] DOB: 03-08-1951 DOA: 05/13/2022     0 DOS: the patient was seen and examined on 05/14/2022   Brief hospital course: DARYAN BUELL is a 71 y.o. male with medical history significant of Dm2, HTN , HLD, CHF systolic, bladder cancer, tobacco abuse, presented on evening of 05/13/2022 for evaluation of worsening left hand redness and swelling after his wife's Chihuaua bit him 3 days before.  He was started on amoxicillin outpatient, but infection was progressing despite this.    Admitted to IV antibiotics. Hand surgeon was consulted and recommended continued IV antibiotics until leukocytosis resolved, but no complications on CT scan, so no surgery is warranted at this time.    Assessment and Plan: * Dog bite Continue IV Unasyn Per hand surgeon, IV antibiotic until leukocytosis resolves. Antipyretics, pain control PRN. Monitor for any worsening swelling, pain or drainage.  Appreciate input from hand surgery. Recommendations: "warm soapy soaks 3 times a day for 20 to 30 minutes and no occlusive dressing to the wound. I will continue IV antibiotics until white count is trending down to normal. With then switch to p.o. Augmentin with follow-up in my office next week "  Cellulitis of left hand See dog bit  Infected dog bite See dog bite  HLD (hyperlipidemia) On statin  Atherosclerosis of native coronary artery of native heart with angina pectoris (HCC) Continue ASA, Lipitor, metoprolol  Chronic systolic heart failure ([place]) Euvolemic to slightly dry. Continue Entresto, Aldactone Lasix held on admission, may resume tomorrow.  COPD (chronic obstructive pulmonary disease) (HCC) Stable.  Continue home regimen.  Type 2 diabetes mellitus with hyperglycemia, without long-term current use of insulin (HCC) Sliding scale insulin for now. Holding home metformin, glipizide  Tobacco abuse Counseled on importance of  cessation. Nicotine patch ordered.  Hx of bladder cancer No acute issues.  OSA (obstructive sleep apnea) Does not use CPAP  Essential hypertension Continue home meds.        Subjective: Pt reports improvement in his left hand swelling.  Some ongoing drainage.  No fever or chills.  Wants to go home today, but we discussed recommendations from hand surgeon to continue IV antibiotics.    Physical Exam: Vitals:   05/13/22 2200 05/13/22 2255 05/14/22 0404 05/14/22 1323  BP: 106/87 126/83 118/64 117/75  Pulse: 86 96 92 80  Resp:  '15 18 18  '$ Temp:  98.2 F (36.8 C) 98.1 F (36.7 C) 98.5 F (36.9 C)  TempSrc:  Oral Oral Oral  SpO2: 98% 91% 93% 95%  Weight:      Height:       General exam: awake, alert, no acute distress HEENT: atraumatic, clear conjunctiva, anicteric sclera, moist mucus membranes, hearing grossly normal  Respiratory system: CTAB, no wheezes, rales or rhonchi, normal respiratory effort. Cardiovascular system: normal S1/S2, RRR, no pedal edema.   Gastrointestinal system: soft, NT, ND, no HSM felt, +bowel sounds. Central nervous system: A&O x4. no gross focal neurologic deficits, normal speech Extremities: left hand swelling of the thenar eminence, small puncture wound partially scabbed with surrounding erythema Skin: dry, intact, left hand thenar area is warm to touch Psychiatry: normal mood, congruent affect, judgement and insight appear normal  Data Reviewed:  WBC still 13.0 this aftermoon, from 12.5 this AM.   CMP with CO2 21, glucose 124, tbili 1.6. CRP 3.5 Lactic acid 2.3 >> 1.1 Normal TSH and free T4   Family Communication: none  Disposition: Status is: Inpatient Remains inpatient  appropriate because: continued IV antibiotics warranted until WBC normalizes to prevent worsening hand infection and possible complications that would require surgical intervention.    Planned Discharge Destination: Home    Time spent: 40 minutes  Author: Ezekiel Slocumb, DO 05/14/2022 4:09 PM  For on call review [email or web address].

## 2022-05-14 NOTE — Consult Note (Signed)
Called to see patient for dog bite left thumb and thenar eminence that occurred several days ago.  Patient was started empirically on p.o. antibiotics and switched IV yesterday after evaluation at [facility].  CT scan shows no evidence of abscess and patient's white count is trending down.  Patient denies any significant pain, fever, or signs of ascending infection.  Patient states this morning the streaking in the arm is much improved and his pain is minimal.  Examination today reveals a well-developed well-nourished male.  He is alert and oriented x 3.  He is examined bedside today.  His upper extremity on the left shows a 2+ radial pulse with brisk cap refill.  He has full range of motion.  He is a small indurated area at the base of the thenar eminence with no significant pain or discharge.  He has full FPL and EPL function.  He has a positive CMC grind and negative Finkelstein's.  Distally on the right and left there is no other signs of neurovascular compromise or compression neuropathy.  Impression is a 71 year old male status post dog bite to his left hand near the thenar eminence with no soft tissue abscess and resolving cellulitis.  I would recommend warm soapy soaks 3 times a day for 20 to 30 minutes and no occlusive dressing to the wound.  I will continue IV antibiotics until white count is trending down to normal.  With then switch to p.o. Augmentin with follow-up in my office next week.  No need for surgery at this point in time.

## 2022-05-14 NOTE — Plan of Care (Signed)
DM POC initiated

## 2022-05-14 NOTE — Assessment & Plan Note (Signed)
Continue IV Unasyn Per hand surgeon, IV antibiotic until leukocytosis resolves. Antipyretics, pain control PRN. Monitor for any worsening swelling, pain or drainage.  Appreciate input from hand surgery. Recommendations: "warm soapy soaks 3 times a day for 20 to 30 minutes and no occlusive dressing to the wound. I will continue IV antibiotics until white count is trending down to normal. With then switch to p.o. Augmentin with follow-up in my office next week "

## 2022-05-14 NOTE — Assessment & Plan Note (Signed)
Continue ASA, Lipitor, metoprolol

## 2022-05-14 NOTE — Hospital Course (Signed)
Timothy Lee is a 71 y.o. male with medical history significant of Dm2, HTN , HLD, CHF systolic, bladder cancer, tobacco abuse, presented on evening of 05/13/2022 for evaluation of worsening left hand redness and swelling after his wife's Chihuaua bit him 3 days before.  He was started on amoxicillin outpatient, but infection was progressing despite this.    Admitted to IV antibiotics. Hand surgeon was consulted and recommended continued IV antibiotics until leukocytosis resolved, but no complications on CT scan, so no surgery is warranted at this time.

## 2022-05-14 NOTE — Assessment & Plan Note (Signed)
-   Spoke about importance of quitting spent 5 minutes discussing options for treatment, prior attempts at quitting, and dangers of smoking ? -At this point patient is    interested in quitting ? - order nicotine patch  ? - nursing tobacco cessation protocol ? ?

## 2022-05-14 NOTE — Assessment & Plan Note (Signed)
Euvolemic to slightly dry. Continue Entresto, Aldactone Lasix held on admission, may resume tomorrow.

## 2022-05-14 NOTE — Assessment & Plan Note (Signed)
On statin.

## 2022-05-14 NOTE — Assessment & Plan Note (Signed)
Does not use CPAP. 

## 2022-05-15 DIAGNOSIS — W540XXD Bitten by dog, subsequent encounter: Secondary | ICD-10-CM

## 2022-05-15 DIAGNOSIS — E785 Hyperlipidemia, unspecified: Secondary | ICD-10-CM

## 2022-05-15 LAB — CBC
HCT: 43.1 % (ref 39.0–52.0)
Hemoglobin: 14.5 g/dL (ref 13.0–17.0)
MCH: 31.3 pg (ref 26.0–34.0)
MCHC: 33.6 g/dL (ref 30.0–36.0)
MCV: 92.9 fL (ref 80.0–100.0)
Platelets: 191 10*3/uL (ref 150–400)
RBC: 4.64 MIL/uL (ref 4.22–5.81)
RDW: 12.7 % (ref 11.5–15.5)
WBC: 11.3 10*3/uL — ABNORMAL HIGH (ref 4.0–10.5)
nRBC: 0 % (ref 0.0–0.2)

## 2022-05-15 LAB — GLUCOSE, CAPILLARY
Glucose-Capillary: 156 mg/dL — ABNORMAL HIGH (ref 70–99)
Glucose-Capillary: 150 mg/dL — ABNORMAL HIGH (ref 70–99)

## 2022-05-15 LAB — HEMOGLOBIN A1C
Hgb A1c MFr Bld: 7.2 % — ABNORMAL HIGH (ref 4.8–5.6)
Mean Plasma Glucose: 160 mg/dL

## 2022-05-15 MED ORDER — AMOXICILLIN-POT CLAVULANATE 875-125 MG PO TABS: 1.0000 | ORAL_TABLET | Freq: Two times a day (BID) | ORAL | 0 refills | Status: AC

## 2022-05-15 NOTE — Plan of Care (Signed)
Problem: Education: Goal: Knowledge of General Education information will improve Description: Including pain rating scale, medication(s)/side effects and non-pharmacologic comfort measures Outcome: Adequate for Discharge   Problem: Health Behavior/Discharge Planning: Goal: Ability to manage health-related needs will improve Outcome: Adequate for Discharge   Problem: Clinical Measurements: Goal: Ability to maintain clinical measurements within normal limits will improve Outcome: Adequate for Discharge Goal: Will remain free from infection Outcome: Adequate for Discharge Goal: Diagnostic test results will improve Outcome: Adequate for Discharge Goal: Respiratory complications will improve Outcome: Adequate for Discharge Goal: Cardiovascular complication will be avoided Outcome: Adequate for Discharge   Problem: Coping: Goal: Level of anxiety will decrease Outcome: Adequate for Discharge   Problem: Elimination: Goal: Will not experience complications related to bowel motility Outcome: Adequate for Discharge Goal: Will not experience complications related to urinary retention Outcome: Adequate for Discharge   Problem: Pain Managment: Goal: General experience of comfort will improve Outcome: Adequate for Discharge   Problem: Safety: Goal: Ability to remain free from injury will improve Outcome: Adequate for Discharge   Problem: Skin Integrity: Goal: Risk for impaired skin integrity will decrease Outcome: Adequate for Discharge   Problem: Education: Goal: Knowledge of General Education information will improve Description: Including pain rating scale, medication(s)/side effects and non-pharmacologic comfort measures Outcome: Adequate for Discharge   Problem: Health Behavior/Discharge Planning: Goal: Ability to manage health-related needs will improve Outcome: Adequate for Discharge   Problem: Clinical Measurements: Goal: Ability to maintain clinical measurements  within normal limits will improve Outcome: Adequate for Discharge Goal: Will remain free from infection Outcome: Adequate for Discharge Goal: Diagnostic test results will improve Outcome: Adequate for Discharge Goal: Respiratory complications will improve Outcome: Adequate for Discharge Goal: Cardiovascular complication will be avoided Outcome: Adequate for Discharge   Problem: Activity: Goal: Risk for activity intolerance will decrease Outcome: Adequate for Discharge   Problem: Nutrition: Goal: Adequate nutrition will be maintained Outcome: Adequate for Discharge   Problem: Coping: Goal: Level of anxiety will decrease Outcome: Adequate for Discharge   Problem: Elimination: Goal: Will not experience complications related to bowel motility Outcome: Adequate for Discharge Goal: Will not experience complications related to urinary retention Outcome: Adequate for Discharge   Problem: Pain Managment: Goal: General experience of comfort will improve Outcome: Adequate for Discharge   Problem: Safety: Goal: Ability to remain free from injury will improve Outcome: Adequate for Discharge   Problem: Skin Integrity: Goal: Risk for impaired skin integrity will decrease Outcome: Adequate for Discharge   Problem: Clinical Measurements: Goal: Ability to avoid or minimize complications of infection will improve Outcome: Adequate for Discharge   Problem: Skin Integrity: Goal: Skin integrity will improve Outcome: Adequate for Discharge   Problem: Education: Goal: Ability to describe self-care measures that may prevent or decrease complications (Diabetes Survival Skills Education) will improve Outcome: Adequate for Discharge Goal: Individualized Educational Video(s) Outcome: Adequate for Discharge   Problem: Coping: Goal: Ability to adjust to condition or change in health will improve Outcome: Adequate for Discharge   Problem: Fluid Volume: Goal: Ability to maintain a balanced  intake and output will improve Outcome: Adequate for Discharge   Problem: Health Behavior/Discharge Planning: Goal: Ability to identify and utilize available resources and services will improve Outcome: Adequate for Discharge Goal: Ability to manage health-related needs will improve Outcome: Adequate for Discharge   Problem: Metabolic: Goal: Ability to maintain appropriate glucose levels will improve Outcome: Adequate for Discharge   Problem: Nutritional: Goal: Maintenance of adequate nutrition will improve Outcome: Adequate for Discharge Goal:  Progress toward achieving an optimal weight will improve Outcome: Adequate for Discharge   Problem: Skin Integrity: Goal: Risk for impaired skin integrity will decrease Outcome: Adequate for Discharge   Problem: Tissue Perfusion: Goal: Adequacy of tissue perfusion will improve Outcome: Adequate for Discharge   Problem: Education: Goal: Ability to describe self-care measures that may prevent or decrease complications (Diabetes Survival Skills Education) will improve Outcome: Adequate for Discharge Goal: Individualized Educational Video(s) Outcome: Adequate for Discharge   Problem: Coping: Goal: Ability to adjust to condition or change in health will improve Outcome: Adequate for Discharge   Problem: Fluid Volume: Goal: Ability to maintain a balanced intake and output will improve Outcome: Adequate for Discharge

## 2022-05-15 NOTE — Discharge Summary (Signed)
Physician Discharge Summary  Timothy Lee TFT:[id number] DOB: 1950-07-24 DOA: 05/13/2022  PCP: Lauree Chandler, NP  Admit date: 05/13/2022 Discharge date: 05/15/2022  Time spent: 45 minutes  Recommendations for Outpatient Follow-up:  Hand surgery Dr. Burney Gauze next week PCP in 1 week   Discharge Diagnoses:  Principal Problem:   Dog bite   Cellulitis of left hand   Essential hypertension   OSA (obstructive sleep apnea)   Hx of bladder cancer   Tobacco abuse   Type 2 diabetes mellitus with hyperglycemia, without long-term current use of insulin (HCC)   COPD (chronic obstructive pulmonary disease) (HCC)   Chronic systolic heart failure (HCC)   Atherosclerosis of native coronary artery of native heart with angina pectoris ([place])   HLD (hyperlipidemia)   Infected dog bite   Discharge Condition: improved  Diet recommendation: Heart healthy, diabetic  Filed Weights   05/13/22 0944  Weight: 109.8 kg    History of present illness:  Timothy Lee is a 71 y.o. male with medical history significant of Dm2, HTN , HLD, CHF systolic, bladder cancer, tobacco abuse, presented on evening of 05/13/2022 for evaluation of worsening left hand redness and swelling after his wife's Chihuaua bit him 3 days before.  He was started on amoxicillin outpatient, but infection was progressing despite this.   Hospital Course:   Dog bite Cellulitis, right hand -Clinically improving, treated with IV Unasyn, warm compresses, leukocytosis improving -Seen by Dr. Burney Gauze from hand surgery yesterday, recommended warm compresses, transition to oral Augmentin when leukocytosis is trending down and follow-up in the office next week -WBC count up to 11 K today, patient is extremely anxious and adamant to go home, area is starting to drain, advised warm compresses and follow-up with hand surgeon next week   HLD (hyperlipidemia) On statin   Atherosclerosis of native coronary artery of native heart  with angina pectoris ([place]) Continue ASA, Lipitor, metoprolol   Chronic systolic heart failure ([place]) Euvolemic  Continue Entresto, Aldactone Diuretics resumed   COPD (chronic obstructive pulmonary disease) ([place]) Stable.  Continue home regimen.   Type 2 diabetes mellitus with hyperglycemia, without long-term current use of insulin (HCC) -Stable, resume glipizide   Tobacco abuse Counseled on importance of cessation. Nicotine patch ordered.   Hx of bladder cancer No acute issues.   OSA (obstructive sleep apnea) Does not use CPAP   Essential hypertension Continue home meds.   Consultations: Hand surgery Dr. Burney Gauze  Discharge Exam: Vitals:   05/15/22 0550 05/15/22 1315  BP: (!) 143/84 (!) 139/91  Pulse: 78 85  Resp: 15 18  Temp: 98 F (36.7 C) 98 F (36.7 C)  SpO2: 94% 94%   Gen: Awake, Alert, Oriented X 3,  HEENT: no JVD Lungs: Good air movement bilaterally, CTAB CVS: S1S2/RRR Abd: soft, Non tender, non distended, BS present Extremities: Left hand, small area of erythema and induration at the base of the left thenar eminence, minimal drainage, filled with full range of motion Skin: as above  Discharge Instructions   Discharge Instructions     Diet - low sodium heart healthy   Complete by: As directed    Discharge instructions   Complete by: As directed    warm soapy soaks 3 times a day for 20 to 30 minutes and no occlusive dressing to the wound -Follow up with hand surgeon Dr. Burney Gauze next week   Increase activity slowly   Complete by: As directed       Allergies as of 05/15/2022  Reactions   Chantix [varenicline Tartrate]    Aggression         Medication List     STOP taking these medications    predniSONE 10 MG (21) Tbpk tablet Commonly known as: STERAPRED UNI-PAK 21 TAB       TAKE these medications    Accu-Chek Guide test strip Generic drug: glucose blood USE TO TEST BLOOD SUGAR DAILY.   Accu-Chek Softclix Lancets  lancets USE TO TEST BLOOD SUGAR DAILY.   acetaminophen 500 MG tablet Commonly known as: TYLENOL Take 2 tablets (1,000 mg total) by mouth every 8 (eight) hours as needed for moderate pain.   albuterol 108 (90 Base) MCG/ACT inhaler Commonly known as: VENTOLIN HFA INHALE 2 PUFFS INTO THE LUNGS EVERY 6 HOURS AS NEEDED FOR WHEEZING OR SHORTNESS OF BREATH.   amoxicillin-clavulanate 875-125 MG tablet Commonly known as: AUGMENTIN Take 1 tablet by mouth 2 (two) times daily for 10 days.   Anoro Ellipta 62.5-25 MCG/ACT Aepb Generic drug: umeclidinium-vilanterol INHALE 1 PUFF INTO THE LUNGS ONCE DAILY.   aspirin 81 MG chewable tablet Chew 1 tablet (81 mg total) by mouth daily.   atorvastatin 80 MG tablet Commonly known as: LIPITOR TAKE ONE TABLET BY MOUTH ONCE DAILY AT 6PM.   carisoprodol 350 MG tablet Commonly known as: SOMA Take 1 tablet (350 mg total) by mouth at bedtime as needed for muscle spasms.   dapagliflozin propanediol 10 MG Tabs tablet Commonly known as: Farxiga Take 1 tablet (10 mg total) by mouth daily before breakfast.   Entresto 97-103 MG Generic drug: sacubitril-valsartan Take 1 tablet by mouth 2 (two) times daily.   furosemide 20 MG tablet Commonly known as: LASIX Take 1 tablet (20 mg total) by mouth daily.   glipiZIDE 5 MG tablet Commonly known as: GLUCOTROL TAKE (1) TABLET BY MOUTH TWICE DAILY.   GLUCOS-CHOND-MSM-DOUBLE STR PO Take 2 tablets by mouth daily.   levocetirizine 5 MG tablet Commonly known as: XYZAL Take 5 mg by mouth daily as needed for allergies.   metFORMIN 1000 MG tablet Commonly known as: GLUCOPHAGE TAKE (1) TABLET BY MOUTH TWICE A DAY WITH MEALS (BREAKFAST AND SUPPER).   metoprolol succinate 25 MG 24 hr tablet Commonly known as: Toprol XL Take 1 tablet (25 mg total) by mouth daily. What changed: when to take this   metoprolol succinate 100 MG 24 hr tablet Commonly known as: Toprol XL Take 1 tablet (100 mg total) by mouth daily.  Take with or immediately following a meal. What changed: when to take this   multivitamin with minerals Tabs tablet Take 1 tablet by mouth daily.   potassium chloride SA 20 MEQ tablet Commonly known as: KLOR-CON M Take 2 tablets (40 mEq total) by mouth daily.   spironolactone 25 MG tablet Commonly known as: ALDACTONE Take 25 mg by mouth daily.   Trulicity 3.41 PF/7.9KW Sopn Generic drug: Dulaglutide INJECT 1 SYRINGE (50 MG) SUBCUTANEOUSLY ONCE A WEEK (ON THE SAME DAY OF EACH WEEK) AS DIRECTED.       Allergies  Allergen Reactions   Chantix [Varenicline Tartrate]     Aggression       The results of significant diagnostics from this hospitalization (including imaging, microbiology, ancillary and laboratory) are listed below for reference.    Significant Diagnostic Studies: CT HAND LEFT W CONTRAST  Result Date: 05/13/2022 CLINICAL DATA:  Soft tissue infection suspected, hand, no prior imaging animal bite, EXAM: CT OF THE UPPER LEFT EXTREMITY WITH CONTRAST TECHNIQUE: Multidetector CT imaging  of the upper left extremity was performed according to the standard protocol following intravenous contrast administration. RADIATION DOSE REDUCTION: This exam was performed according to the departmental dose-optimization program which includes automated exposure control, adjustment of the mA and/or kV according to patient size and/or use of iterative reconstruction technique. CONTRAST:  58m OMNIPAQUE IOHEXOL 300 MG/ML  SOLN COMPARISON:  None Available. FINDINGS: Bones/Joint/Cartilage There is no evidence of acute fracture. There is severe first carpometacarpal joint osteoarthritis with bulky osteophyte formation, subchondral sclerosis and cystic change, and slight radial subluxation of the thumb metacarpal with respect to the trapezium. There is mild triscaphe joint osteoarthritis. Additional mild scattered osteoarthritis throughout the hand and wrist. There is no bony erosion or periostitis.  Ligaments Suboptimally assessed by CT. Muscles and Tendons There is prominence and slight lower attenuation of the palmar thenar musculature, suggesting intramuscular edema. No evidence of intramuscular collection by CT. There is no evidence of tenosynovitis. Soft tissues Superficial soft tissue swelling along the radial hand/thenar eminence. No evidence of a well-defined/drainable fluid collection. No soft tissue gas. IMPRESSION: Superficial and intramuscular soft tissue swelling of the radial hand/thenar eminence. No evidence of soft tissue abscess or osseous changes to suggest osteomyelitis by CT. No evidence of tenosynovitis. No soft tissue gas. Severe thumb carpometacarpal joint osteoarthritis. Electronically Signed   By: JMaurine SimmeringM.D.   On: 05/13/2022 11:57    Microbiology: Recent Results (from the past 240 hour(s))  Culture, blood (routine x 2)     Status: None (Preliminary result)   Collection Time: 05/13/22  9:55 AM   Specimen: BLOOD  Result Value Ref Range Status   Specimen Description   Final    BLOOD LEFT ANTECUBITAL Performed at [facility], [address] [place] [address]   Special Requests   Final    BOTTLES DRAWN AEROBIC AND ANAEROBIC Blood Culture results may not be optimal due to an inadequate volume of blood received in culture bottles Performed at [facility], [address] [place] [address]   Culture   Final    NO GROWTH < 24 HOURS Performed at [facility] [address], [place] [address]   Report Status PENDING  Incomplete  Culture, blood (routine x 2)     Status: None (Preliminary result)   Collection Time: 05/13/22 11:00 AM   Specimen: BLOOD  Result Value Ref Range Status   Specimen Description   Final    BLOOD RIGHT ARM Performed at [facility], [address] [place] [address]   Special Requests   Final    BOTTLES DRAWN AEROBIC AND ANAEROBIC  Blood Culture adequate volume Performed at [facility], [address] [place] [address]   Culture   Final    NO GROWTH < 24 HOURS Performed at [facility] [address] [place] [address]   Report Status PENDING  Incomplete     Labs: Basic Metabolic Panel: Recent Labs  Lab 05/13/22 0946 05/14/22 0050  NA 136 138  K 4.0 3.7  CL 102 108  CO2 21* 21*  GLUCOSE 216* 124*  BUN 16 13  CREATININE 0.90 0.80  CALCIUM 9.5 8.9  MG  --  2.2  PHOS  --  3.1   Liver Function Tests: Recent Labs  Lab 05/13/22 0946 05/14/22 0050  AST 12* 15  ALT 22 24  ALKPHOS 47 43  BILITOT 1.3* 1.6*  PROT 6.6 5.9*  ALBUMIN 4.3 3.6   No results for input(s): "LIPASE", "AMYLASE" in the last 168 hours. No results for input(s): "AMMONIA" in the last 168 hours. CBC: Recent Labs  Lab 05/13/22 0946 05/14/22 0050 05/14/22 1527 05/15/22 0432  WBC 13.4* 12.5* 13.0* 11.3*  NEUTROABS 10.4*  --   --   --   HGB 15.9 14.5 15.1 14.5  HCT 47.0 44.0 44.6 43.1  MCV 90.9 93.2 92.0 92.9  PLT 202 186 223 191   Cardiac Enzymes: Recent Labs  Lab 05/14/22 0050  CKTOTAL 101   BNP: BNP (last 3 results) No results for input(s): "BNP" in the last 8760 hours.  ProBNP (last 3 results) No results for input(s): "PROBNP" in the last 8760 hours.  CBG: Recent Labs  Lab 05/14/22 0706 05/14/22 1113 05/14/22 1542 05/15/22 0759 05/15/22 1214  GLUCAP 127* 169* 148* 156* 150*     Signed:  Domenic Polite MD.  [facility] 05/15/2022, 3:01 PM

## 2022-05-16 ENCOUNTER — Telehealth: Payer: Self-pay

## 2022-05-16 NOTE — Patient Outreach (Signed)
  Care Coordination TOC Note Transition Care Management Unsuccessful Follow-up Telephone Call  Date of discharge and from where:  05/15/22-[facility]   Attempts:  1st Attempt  Reason for unsuccessful TCM follow-up call:  Left voice message     Hetty Blend [place] Management Telephonic Care Management Coordinator Direct Phone: [phone] Toll Free: [phone] Fax: [phone]

## 2022-05-17 ENCOUNTER — Other Ambulatory Visit: Payer: Self-pay | Admitting: Nurse Practitioner

## 2022-05-17 ENCOUNTER — Telehealth: Payer: Self-pay

## 2022-05-17 DIAGNOSIS — E1165 Type 2 diabetes mellitus with hyperglycemia: Secondary | ICD-10-CM

## 2022-05-17 NOTE — Patient Outreach (Signed)
  Care Coordination TOC Note Transition Care Management Unsuccessful Follow-up Telephone Call  Date of discharge and from where:  05/15/22-[facility]   Attempts:  2nd Attempt  Reason for unsuccessful TCM follow-up call:  No answer/busy    Enzo Montgomery, RN,BSN,CCM [place] Management Telephonic Care Management Coordinator Direct Phone: [phone] Toll Free: [phone] Fax: [phone]

## 2022-05-17 NOTE — Patient Outreach (Signed)
  Care Coordination TOC Note Transition Care Management Follow-up Telephone Call Date of discharge and from where: 12//27/23-[facility]  Dx: "animal bite"   How have you been since you were released from the hospital? Patient voices that she is "getting better everyday." The redeness and swelling has gone down a lot. He is soaking affected area as advised and taking abx therapy. Any questions or concerns? No  Items Reviewed: Did the pt receive and understand the discharge instructions provided? Yes  Medications obtained and verified? Yes  Other? Yes -wound care Any new allergies since your discharge? No  Dietary orders reviewed? Yes Do you have support at home? Yes -spouse  Home Care and Equipment/Supplies: Were home health services ordered? not applicable If so, what is the name of the agency? N/A  Has the agency set up a time to come to the patient's home? not applicable Were any new equipment or medical supplies ordered?  No What is the name of the medical supply agency? N/A Were you able to get the supplies/equipment? not applicable Do you have any questions related to the use of the equipment or supplies? No  Functional Questionnaire: (I = Independent and D = Dependent) ADLs: I  Bathing/Dressing- I  Meal Prep- I  Eating- I  Maintaining continence- I  Transferring/Ambulation- I  Managing Meds- I  Follow up appointments reviewed:  PCP Hospital f/u appt confirmed? No -reviewed d/c instructions and recommendation to follow up with PCP in 1 wk. Patient voices he does not see the need and not interested in making an appt.  [facility] f/u appt confirmed? Yes  Scheduled to see Dr. Burney Gauze on 05/21/22. Are transportation arrangements needed? No  If their condition worsens, is the pt aware to call PCP or go to the Emergency Dept.? Yes Was the patient provided with contact information for the PCP's office or ED? Yes Was to pt encouraged to call back with  questions or concerns? Yes  SDOH assessments and interventions completed:   Yes SDOH Interventions Today    Flowsheet Row Most Recent Value  SDOH Interventions   Food Insecurity Interventions Intervention Not Indicated  Transportation Interventions Intervention Not Indicated       Care Coordination Interventions:  Education provided    Encounter Outcome:  Pt. Visit Completed     Enzo Montgomery, RN,BSN,CCM [place] Management Telephonic Care Management Coordinator Direct Phone: [phone] Toll Free: [phone] Fax: [phone]

## 2022-05-18 LAB — CULTURE, BLOOD (ROUTINE X 2)
Culture: NO GROWTH
Culture: NO GROWTH
Special Requests: ADEQUATE

## 2022-05-21 DIAGNOSIS — S6982XA Other specified injuries of left wrist, hand and finger(s), initial encounter: Secondary | ICD-10-CM | POA: Diagnosis not present

## 2022-05-28 LAB — T3: T3, Total: 97 ng/dL (ref 71–180)

## 2022-06-04 ENCOUNTER — Other Ambulatory Visit: Payer: Self-pay | Admitting: Nurse Practitioner

## 2022-06-04 DIAGNOSIS — J209 Acute bronchitis, unspecified: Secondary | ICD-10-CM

## 2022-06-28 ENCOUNTER — Other Ambulatory Visit: Payer: Self-pay | Admitting: Nurse Practitioner

## 2022-07-03 ENCOUNTER — Other Ambulatory Visit: Payer: Self-pay | Admitting: Nurse Practitioner

## 2022-07-03 DIAGNOSIS — J209 Acute bronchitis, unspecified: Secondary | ICD-10-CM

## 2022-08-12 ENCOUNTER — Other Ambulatory Visit: Payer: Self-pay | Admitting: Cardiology

## 2022-08-21 ENCOUNTER — Other Ambulatory Visit: Payer: Self-pay | Admitting: Cardiology

## 2022-09-10 ENCOUNTER — Other Ambulatory Visit: Payer: Self-pay | Admitting: Nurse Practitioner

## 2022-09-14 ENCOUNTER — Other Ambulatory Visit: Payer: Self-pay | Admitting: Orthopedic Surgery

## 2022-09-14 ENCOUNTER — Telehealth: Payer: Medicare Other | Admitting: Orthopedic Surgery

## 2022-09-14 ENCOUNTER — Telehealth: Payer: Self-pay | Admitting: Orthopedic Surgery

## 2022-09-14 DIAGNOSIS — J449 Chronic obstructive pulmonary disease, unspecified: Secondary | ICD-10-CM

## 2022-09-14 MED ORDER — ANORO ELLIPTA 62.5-25 MCG/ACT IN AEPB: INHALATION_SPRAY | RESPIRATORY_TRACT | 1 refills | Status: DC

## 2022-09-14 NOTE — Telephone Encounter (Signed)
Patient called on call provider very upset Anoro Ellipta inhaler was not refilled. Prescription refill was sent 09/10/2022 by Shanda Bumps. He claims pharmacy has not refilled medication. I resent refilled for medication.

## 2022-09-16 ENCOUNTER — Other Ambulatory Visit: Payer: Self-pay | Admitting: Nurse Practitioner

## 2022-09-16 DIAGNOSIS — J209 Acute bronchitis, unspecified: Secondary | ICD-10-CM

## 2022-09-18 ENCOUNTER — Other Ambulatory Visit: Payer: Self-pay | Admitting: Cardiology

## 2022-09-25 ENCOUNTER — Other Ambulatory Visit: Payer: Self-pay | Admitting: Nurse Practitioner

## 2022-09-25 DIAGNOSIS — I5022 Chronic systolic (congestive) heart failure: Secondary | ICD-10-CM

## 2022-09-25 DIAGNOSIS — E1165 Type 2 diabetes mellitus with hyperglycemia: Secondary | ICD-10-CM

## 2022-09-26 ENCOUNTER — Other Ambulatory Visit: Payer: Self-pay | Admitting: Cardiology

## 2022-09-26 MED ORDER — DAPAGLIFLOZIN PROPANEDIOL 10 MG PO TABS: 10.0000 mg | ORAL_TABLET | Freq: Every day | ORAL | 0 refills | Status: DC

## 2022-10-14 ENCOUNTER — Other Ambulatory Visit: Payer: Self-pay | Admitting: Cardiology

## 2022-10-16 ENCOUNTER — Other Ambulatory Visit: Payer: Self-pay | Admitting: Cardiology

## 2022-10-24 ENCOUNTER — Other Ambulatory Visit: Payer: Self-pay | Admitting: Nurse Practitioner

## 2022-10-24 DIAGNOSIS — E1165 Type 2 diabetes mellitus with hyperglycemia: Secondary | ICD-10-CM

## 2022-10-24 DIAGNOSIS — I5022 Chronic systolic (congestive) heart failure: Secondary | ICD-10-CM

## 2022-10-25 ENCOUNTER — Other Ambulatory Visit: Payer: Self-pay

## 2022-10-25 MED ORDER — DAPAGLIFLOZIN PROPANEDIOL 10 MG PO TABS: 10.0000 mg | ORAL_TABLET | Freq: Every day | ORAL | 0 refills | Status: DC

## 2022-11-01 ENCOUNTER — Encounter: Payer: Medicare Other | Admitting: Nurse Practitioner

## 2022-11-01 NOTE — Progress Notes (Signed)
Patient was a no-show 

## 2022-11-08 DIAGNOSIS — E119 Type 2 diabetes mellitus without complications: Secondary | ICD-10-CM | POA: Diagnosis not present

## 2022-11-08 DIAGNOSIS — H04123 Dry eye syndrome of bilateral lacrimal glands: Secondary | ICD-10-CM | POA: Diagnosis not present

## 2022-11-08 DIAGNOSIS — H25813 Combined forms of age-related cataract, bilateral: Secondary | ICD-10-CM | POA: Diagnosis not present

## 2022-11-08 DIAGNOSIS — H524 Presbyopia: Secondary | ICD-10-CM | POA: Diagnosis not present

## 2022-11-08 DIAGNOSIS — H01021 Squamous blepharitis right upper eyelid: Secondary | ICD-10-CM | POA: Diagnosis not present

## 2022-11-08 LAB — HM DIABETES EYE EXAM

## 2022-11-09 ENCOUNTER — Other Ambulatory Visit: Payer: Self-pay | Admitting: Cardiology

## 2022-11-12 ENCOUNTER — Other Ambulatory Visit: Payer: Self-pay

## 2022-11-12 DIAGNOSIS — J449 Chronic obstructive pulmonary disease, unspecified: Secondary | ICD-10-CM

## 2022-11-12 MED ORDER — ANORO ELLIPTA 62.5-25 MCG/ACT IN AEPB: INHALATION_SPRAY | RESPIRATORY_TRACT | 1 refills | Status: DC

## 2022-11-22 ENCOUNTER — Other Ambulatory Visit: Payer: Self-pay | Admitting: Nurse Practitioner

## 2022-11-22 ENCOUNTER — Other Ambulatory Visit: Payer: Self-pay | Admitting: Cardiology

## 2022-11-22 DIAGNOSIS — E1165 Type 2 diabetes mellitus with hyperglycemia: Secondary | ICD-10-CM

## 2022-11-22 DIAGNOSIS — I5022 Chronic systolic (congestive) heart failure: Secondary | ICD-10-CM

## 2022-12-10 ENCOUNTER — Other Ambulatory Visit: Payer: Self-pay | Admitting: Nurse Practitioner

## 2022-12-13 ENCOUNTER — Other Ambulatory Visit: Payer: Self-pay | Admitting: Nurse Practitioner

## 2022-12-13 DIAGNOSIS — I5022 Chronic systolic (congestive) heart failure: Secondary | ICD-10-CM

## 2022-12-13 DIAGNOSIS — E1165 Type 2 diabetes mellitus with hyperglycemia: Secondary | ICD-10-CM

## 2022-12-13 NOTE — Telephone Encounter (Signed)
Noted  

## 2022-12-13 NOTE — Telephone Encounter (Signed)
Can patients have refills on medication Trulicity?

## 2022-12-13 NOTE — Telephone Encounter (Signed)
Needs to keep appt prior to any additional  refils

## 2022-12-16 ENCOUNTER — Encounter: Payer: Self-pay | Admitting: Nurse Practitioner

## 2022-12-16 ENCOUNTER — Encounter: Payer: Medicare Other | Admitting: Nurse Practitioner

## 2022-12-17 NOTE — Progress Notes (Signed)
No show to appt.

## 2022-12-30 ENCOUNTER — Other Ambulatory Visit: Payer: Self-pay | Admitting: Cardiology

## 2022-12-31 MED ORDER — ENTRESTO 97-103 MG PO TABS: 1.0000 | ORAL_TABLET | Freq: Two times a day (BID) | ORAL | 0 refills | Status: DC

## 2023-01-03 ENCOUNTER — Other Ambulatory Visit: Payer: Self-pay

## 2023-01-03 DIAGNOSIS — J209 Acute bronchitis, unspecified: Secondary | ICD-10-CM

## 2023-01-03 MED ORDER — ALBUTEROL SULFATE HFA 108 (90 BASE) MCG/ACT IN AERS: 2.0000 | INHALATION_SPRAY | Freq: Four times a day (QID) | RESPIRATORY_TRACT | 1 refills | Status: DC | PRN

## 2023-01-06 ENCOUNTER — Ambulatory Visit: Payer: Medicare Other | Attending: Cardiology | Admitting: Cardiology

## 2023-01-06 ENCOUNTER — Encounter: Payer: Self-pay | Admitting: Cardiology

## 2023-01-06 VITALS — BP 100/70 | HR 76 | Ht 71.0 in | Wt 235.0 lb

## 2023-01-06 DIAGNOSIS — I2510 Atherosclerotic heart disease of native coronary artery without angina pectoris: Secondary | ICD-10-CM

## 2023-01-06 DIAGNOSIS — I5022 Chronic systolic (congestive) heart failure: Secondary | ICD-10-CM | POA: Diagnosis not present

## 2023-01-06 DIAGNOSIS — E7800 Pure hypercholesterolemia, unspecified: Secondary | ICD-10-CM

## 2023-01-06 DIAGNOSIS — I420 Dilated cardiomyopathy: Secondary | ICD-10-CM

## 2023-01-06 DIAGNOSIS — I10 Essential (primary) hypertension: Secondary | ICD-10-CM | POA: Diagnosis not present

## 2023-01-06 NOTE — Progress Notes (Signed)
Cardiology Office Note:    Date:  01/06/2023   ID:  ESAU WILKIN, DOB [birth year], MRN [id number]  PCP:  Sharon Seller, NP  Cardiologist:  Armanda Magic, MD    Referring MD: Sharon Seller, NP   Chief Complaint  Patient presents with   Coronary Artery Disease   Hypertension   Hyperlipidemia   Congestive Heart Failure   Cardiomyopathy     History of Present Illness:    Timothy Lee is a 72 y.o. male with a hx of hypertension, DM2, chronic systolic CHF, NICM (EF 20% by cMRI 2021), RV dysfunction (RV EF 36% by cMRI 2021), untreated OSA, COPD and longstanding tobacco use.  He was admitted 03/2019 for acute CHF and COPD exacerbation. Echo 2021 showed LVEF at 35 to 40% with global hypokinesis and left ventricular regional wall motion abnormalities. Cath showed 20% prox and 60% mid LAD stenosis and med management was recommended.   cMRI done 12/2019 showed severe LV dysfunction with EF 29% and moderate RV dysfunction with RVEF 36% and no evidence of LGE or amyloidosis.  Repeat echo 2022 on GDMT showed EF 50-55% with normal RVF.  He is here today for followup and is doing well.  He has chronic DOE related to his smoking but is cutting back on cigarettes.  He denies any chest pain or pressure, PND, orthopnea, LE edema, dizziness, palpitations or syncope. He is compliant with his meds and is tolerating meds with no SE.  He has been cutting back on tobacco and is down is 1 pack every 3-4 days.   Past Medical History:  Diagnosis Date   Arthritis    Bladder cancer [facility]) urologist-  dr Stephanie Acre   Borderline diabetes    Bursitis of both shoulders    CAD (coronary artery disease), native coronary artery    Cath 03/2019 with 20% prox and 60% mid LAD stenosis >>medical management recommended   Chronic systolic CHF (congestive heart failure), NYHA class 2 (HCC)    EF 29% by cMRI>>improved to 50-55% by echo 02/2021   Diabetes mellitus without complication (HCC)    Disorder of male  genital organs, unspecified    Dyspnea    Esophagus disorder    Constricted Esophagus, per New Patient Packet-PSC    History of basal cell carcinoma (BCC) excision    05/ 2018  right forehead   HLD (hyperlipidemia)    Hypertension    Mild acid reflux no meds   Nocturia    OSA (obstructive sleep apnea)    per pt has not used cpap in years   Renal cyst, left 11/2011   16 mm   Sleep apnea    Per New Patient Packet-PSC    Smokers' cough (HCC)    SOB (shortness of breath) on exertion     Past Surgical History:  Procedure Laterality Date   CYSTOSCOPY WITH BIOPSY  03/13/2012   Procedure: CYSTOSCOPY WITH BIOPSY;  Surgeon: Antony Haste, MD;  Location: [facility];  Service: Urology;  Laterality: N/A;      ESOPHAGEAL DILATION  05/20/1986   Esophagus Stretch, per New Patient Packet-PSC    RIGHT/LEFT HEART CATH AND CORONARY ANGIOGRAPHY N/A 04/16/2019   Procedure: RIGHT/LEFT HEART CATH AND CORONARY ANGIOGRAPHY;  Surgeon: Lennette Bihari, MD;  Location: [facility];  Service: Cardiovascular;  Laterality: N/A;   SHOULDER ARTHROSCOPY WITH ROTATOR CUFF REPAIR Left 03/15/2021   Procedure: Left shoulder arthroscopy, debridement, subacromial decompression, distal clavicle resection, possible rotator  cuff repair;  Surgeon: Francena Hanly, MD;  Location: [facility];  Service: Orthopedics;  Laterality: Left;   TONSILLECTOMY  age 75   TRANSURETHRAL RESECTION OF BLADDER TUMOR N/A 05/23/2017   Procedure: TRANSURETHRAL RESECTION OF BLADDER TUMOR / (TURBT) WITH INSTILLATION OF EPIRUBICIN;  Surgeon: Jerilee Field, MD;  Location: [facility];  Service: Urology;  Laterality: N/A;   UVULECTOMY  05/20/1990   Dr.Byers, per new patient packet    UVULOPALATOPHARYNGOPLASTY  2005   osa    Current Medications: Current Meds  Medication Sig   ACCU-CHEK GUIDE test strip USE TO TEST BLOOD SUGAR DAILY.   Accu-Chek Softclix Lancets lancets USE TO TEST BLOOD SUGAR DAILY.    acetaminophen (TYLENOL) 500 MG tablet Take 2 tablets (1,000 mg total) by mouth every 8 (eight) hours as needed for moderate pain.   albuterol (VENTOLIN HFA) 108 (90 Base) MCG/ACT inhaler Inhale 2 puffs into the lungs every 6 (six) hours as needed for wheezing or shortness of breath.   aspirin 81 MG chewable tablet Chew 1 tablet (81 mg total) by mouth daily.   atorvastatin (LIPITOR) 80 MG tablet TAKE ONE TABLET BY MOUTH ONCE DAILY AT 6PM.   carisoprodol (SOMA) 350 MG tablet Take 1 tablet (350 mg total) by mouth at bedtime as needed for muscle spasms.   dapagliflozin propanediol (FARXIGA) 10 MG TABS tablet Take 1 tablet (10 mg total) by mouth daily before breakfast. Please keep scheduled appointment for future refills. Thank you.   furosemide (LASIX) 20 MG tablet Take 1 tablet (20 mg total) by mouth daily.   glipiZIDE (GLUCOTROL) 5 MG tablet TAKE (1) TABLET BY MOUTH TWICE DAILY.   Glucosamine-Chondroitin-MSM (GLUCOS-CHOND-MSM-DOUBLE STR PO) Take 2 tablets by mouth daily.   levocetirizine (XYZAL) 5 MG tablet Take 5 mg by mouth daily as needed for allergies.   metFORMIN (GLUCOPHAGE) 1000 MG tablet TAKE (1) TABLET BY MOUTH TWICE A DAY WITH MEALS (BREAKFAST AND SUPPER).   metoprolol succinate (TOPROL-XL) 100 MG 24 hr tablet Take 1 tablet (100 mg total) by mouth daily.   metoprolol succinate (TOPROL-XL) 25 MG 24 hr tablet TAKE (1) TABLET BY MOUTH ONCE DAILY.   Multiple Vitamin (MULTIVITAMIN WITH MINERALS) TABS tablet Take 1 tablet by mouth daily.   potassium chloride SA (KLOR-CON M) 20 MEQ tablet TAKE (2) TABLETS BY MOUTH ONCE DAILY.   sacubitril-valsartan (ENTRESTO) 97-103 MG Take 1 tablet by mouth 2 (two) times daily.   spironolactone (ALDACTONE) 25 MG tablet TAKE (1) TABLET BY MOUTH ONCE DAILY.   TRULICITY 0.75 MG/0.5ML SOPN INJECT 1 SYRINGE (50 MG) SUBCUTANEOUSLY ONCE A WEEK (ON THE SAME DAY OF EACH WEEK) AS DIRECTED.   umeclidinium-vilanterol (ANORO ELLIPTA) 62.5-25 MCG/ACT AEPB INHALE 1 PUFF INTO  THE LUNGS ONCE DAILY.     Allergies:   Chantix [varenicline tartrate]   Social History   Socioeconomic History   Marital status: Married    Spouse name: Not on file   Number of children: Not on file   Years of education: Not on file   Highest education level: Not on file  Occupational History   Not on file  Tobacco Use   Smoking status: Every Day    Current packs/day: 0.50    Average packs/day: 0.5 packs/day for 47.0 years (23.5 ttl pk-yrs)    Types: Cigarettes   Smokeless tobacco: Former    Types: Chew    Quit date: 1970  Vaping Use   Vaping status: Former  Substance and Sexual Activity   Alcohol use:  Yes    Comment: very seldom- rare   Drug use: Not Currently   Sexual activity: Not Currently  Other Topics Concern   Not on file  Social History Narrative   Diet: None      Caffeine: Yes      Married, if yes what year:  Yes, 1990      Do you live in a house, apartment, assisted living, condo, trailer, ect: House, 3 stories, 4 persons      Pets: 2 dogs, 2 cats       Highest level of education: Some Engineer, mining profession: Architect       Exercise: Some         Living Will: Yes   DNR: Yes   POA/HPOA: Yes      Functional Status:   Do you have difficulty bathing or dressing yourself? No   Do you have difficulty preparing food or eating? No   Do you have difficulty managing your medications? No   Do you have difficulty managing your finances? No   Do you have difficulty affording your medications? No   Social Determinants of Health   Financial Resource Strain: Not on file  Food Insecurity: No Food Insecurity (05/17/2022)   Hunger Vital Sign    Worried About Running Out of Food in the Last Year: Never true    Ran Out of Food in the Last Year: Never true  Transportation Needs: No Transportation Needs (05/17/2022)   PRAPARE - Administrator, Civil Service (Medical): No    Lack of Transportation (Non-Medical): No  Physical  Activity: Not on file  Stress: Not on file  Social Connections: Not on file     Family History: The patient's family history includes ADD / ADHD in his daughter and son; Arthritis in his mother; Cancer in his maternal grandmother; Dementia in his mother; Diabetes in his father, mother, and sister; Hypertension in his mother and sister; Migraines in his daughter; Multiple sclerosis in his father; Stroke in his mother.  ROS:   Please see the history of present illness.    ROS  All other systems reviewed and negative.   EKGs/Labs/Other Studies Reviewed:    The following studies were reviewed today: 2D echo, cMRI  Recent Labs: 05/14/2022: ALT 24; BUN 13; Creatinine, Ser 0.80; Magnesium 2.2; Potassium 3.7; Sodium 138; TSH 0.895 05/15/2022: Hemoglobin 14.5; Platelets 191   Recent Lipid Panel    Component Value Date/Time   CHOL 117 04/29/2022 0917   CHOL 91 (L) 02/12/2021 1033   TRIG 136 04/29/2022 0917   HDL 46 04/29/2022 0917   HDL 39 (L) 02/12/2021 1033   CHOLHDL 2.5 04/29/2022 0917   VLDL 10 04/16/2019 0420   LDLCALC 49 04/29/2022 0917    Physical Exam:    VS:  BP 100/70 (BP Location: Left Arm, Patient Position: Sitting, Cuff Size: Normal)   Pulse 76   Ht 5\' 11"  (1.803 m)   Wt 235 lb (106.6 kg)   BMI 32.78 kg/m     Wt Readings from Last 3 Encounters:  01/06/23 235 lb (106.6 kg)  05/13/22 242 lb 1 oz (109.8 kg)  04/29/22 242 lb (109.8 kg)  GEN: Well nourished, well developed in no acute distress HEENT: Normal NECK: No JVD; No carotid bruits LYMPHATICS: No lymphadenopathy CARDIAC:RRR, no murmurs, rubs, gallops RESPIRATORY:  scattered wheezes ABDOMEN: Soft, non-tender, non-distended MUSCULOSKELETAL:  No edema; No deformity  SKIN: Warm and dry NEUROLOGIC:  Alert  and oriented x 3 PSYCHIATRIC:  Normal affect  ASSESSMENT:    1. Coronary artery disease involving native coronary artery of native heart without angina pectoris   2. DCM (dilated cardiomyopathy) (HCC)    3. Chronic systolic heart failure (HCC)   4. Essential hypertension   5. Pure hypercholesterolemia     PLAN:    In order of problems listed above:  1.  ASCAD -Cath 03/2019 with 20% prox and 60% mid LAD stenosis >>medical management recommended -He denied any anginal symptoms -Continue drug management with aspirin 81 mg daily, atorvastatin 80 mg daily and Toprol XL 125 mg daily with as needed refills  2.  Mixed ischemic/nonischemic DCM/Chronic systolic CHF -2D echo 03/2019 with EF 35-40%  -cMRI with moderate to severe LV dysfunction EF 29% and moderate RF dysfunction -LV dysfunction out of proportion to CAD and c/w NICM -2D echo 2022 showed normalization of LV function with EF 50 to 55% -He appears euvolemic on exam today -Continue prescription drug management with Farxiga 10 mg daily, Toprol-XL 125 mg daily, Entresto 97-23 mg twice daily, Lasix 20mg  daily and spironolactone 25 mg daily with as needed refills -I have personally reviewed and interpreted outside labs performed by patient's PCP which showed serum creatinine 0.80, potassium 3.7 on 05/14/2022 -I will repeat a BMP today  3.  HTN -BP is adequately controlled on exam today -Continue prescription drug management with Toprol-XL 125 mg daily, Entresto 97-23 mg twice daily and spironolactone 25 mg daily with as needed refills  4.  HLD -LDL goal < 70 -I have personally reviewed and interpreted outside labs performed by patient's PCP which showed LDL 49 and HDL 46 on 04/29/2022 -Continue prescription drug management with atorvastatin 80 mg daily with as needed refills  5.  Tobacco abuse -he continues to smoke -we discussed again the need to stop smoking  6.  OSA -ordered split night sleep study at several OV and he never followed through  Medication Adjustments/Labs and Tests Ordered: Current medicines are reviewed at length with the patient today.  Concerns regarding medicines are outlined above.  No orders of the  defined types were placed in this encounter.   No orders of the defined types were placed in this encounter.   Signed, Armanda Magic, MD  01/06/2023 10:27 AM    [facility]

## 2023-01-06 NOTE — Patient Instructions (Signed)
Medication Instructions:  Your physician recommends that you continue on your current medications as directed. Please refer to the Current Medication list given to you today.  *If you need a refill on your cardiac medications before your next appointment, please call your pharmacy*   Lab Work: None.  If you have labs (blood work) drawn today and your tests are completely normal, you will receive your results only by: MyChart Message (if you have MyChart) OR A paper copy in the mail If you have any lab test that is abnormal or we need to change your treatment, we will call you to review the results.   Testing/Procedures: None.   Follow-Up: At [facility], you and your health needs are our priority.  As part of our continuing mission to provide you with exceptional heart care, we have created designated Provider Care Teams.  These Care Teams include your primary Cardiologist (physician) and Advanced Practice Providers (APPs -  Physician Assistants and Nurse Practitioners) who all work together to provide you with the care you need, when you need it.  We recommend signing up for the patient portal called "MyChart".  Sign up information is provided on this After Visit Summary.  MyChart is used to connect with patients for Virtual Visits (Telemedicine).  Patients are able to view lab/test results, encounter notes, upcoming appointments, etc.  Non-urgent messages can be sent to your provider as well.   To learn more about what you can do with MyChart, go to [email or web address].    Your next appointment:   1 year(s)  Provider:   Traci Turner, MD     

## 2023-01-10 ENCOUNTER — Ambulatory Visit (INDEPENDENT_AMBULATORY_CARE_PROVIDER_SITE_OTHER): Payer: Medicare Other | Admitting: Nurse Practitioner

## 2023-01-10 ENCOUNTER — Encounter: Payer: Self-pay | Admitting: Nurse Practitioner

## 2023-01-10 VITALS — BP 110/68 | HR 90 | Temp 97.8°F | Resp 17 | Ht 71.0 in | Wt 238.0 lb

## 2023-01-10 DIAGNOSIS — E1165 Type 2 diabetes mellitus with hyperglycemia: Secondary | ICD-10-CM

## 2023-01-10 DIAGNOSIS — I5022 Chronic systolic (congestive) heart failure: Secondary | ICD-10-CM | POA: Diagnosis not present

## 2023-01-10 DIAGNOSIS — J449 Chronic obstructive pulmonary disease, unspecified: Secondary | ICD-10-CM

## 2023-01-10 DIAGNOSIS — I25119 Atherosclerotic heart disease of native coronary artery with unspecified angina pectoris: Secondary | ICD-10-CM | POA: Diagnosis not present

## 2023-01-10 DIAGNOSIS — E6609 Other obesity due to excess calories: Secondary | ICD-10-CM

## 2023-01-10 DIAGNOSIS — Z6833 Body mass index (BMI) 33.0-33.9, adult: Secondary | ICD-10-CM

## 2023-01-10 DIAGNOSIS — I10 Essential (primary) hypertension: Secondary | ICD-10-CM

## 2023-01-10 DIAGNOSIS — E782 Mixed hyperlipidemia: Secondary | ICD-10-CM

## 2023-01-10 NOTE — Progress Notes (Signed)
Careteam: Patient Care Team: Sharon Seller, NP as PCP - General (Geriatric Medicine) Quintella Reichert, MD as PCP - Cardiology (Cardiology) Suzanna Obey, MD as Consulting Physician (Otolaryngology) Jerilee Field, MD as Consulting Physician (Urology) Sallye Lat, MD as Consulting Physician (Ophthalmology)  PLACE OF SERVICE:  [facility]  Advanced Directive information Does Patient Have a Medical Advance Directive?: No  Allergies  Allergen Reactions   Chantix [Varenicline Tartrate]     Aggression     Chief Complaint  Patient presents with   Medical Management of Chronic Issues    Patient is being seen for a 6 month follow up   Immunizations    Patient is being is due for shingles , covid    Quality Metric Gaps    Patient is due for AWV   Health Maintenance    Lung cancer screening, foot exam A1c needed      HPI: Patient is a 72 y.o. male for routine follow up.   Just had a first grandchild 2 weeks ago.  Can not smoke around baby so working to cut back.   DM- does not check blood sugar.  Does not have episodes of hypoglycemia  Will have shaking in left hand and nerve pain- since shoulder shoulder.   Went to the hospital after hospital bite in December. Had cellulitis and needed to be admitted for IV antibiotics.   CAD- continues to follow up with cardiology, no changes to medication  No chest pains or shortness of breath.  Review of Systems:  Review of Systems  Constitutional:  Negative for chills, fever and weight loss.  HENT:  Negative for tinnitus.   Respiratory:  Negative for cough, sputum production and shortness of breath.   Cardiovascular:  Negative for chest pain, palpitations and leg swelling.  Gastrointestinal:  Negative for abdominal pain, constipation, diarrhea and heartburn.  Genitourinary:  Negative for dysuria, frequency and urgency.  Musculoskeletal:  Negative for back pain, falls, joint pain and myalgias.  Skin: Negative.    Neurological:  Negative for dizziness and headaches.  Psychiatric/Behavioral:  Negative for depression and memory loss. The patient does not have insomnia.     Past Medical History:  Diagnosis Date   Arthritis    Bladder cancer [facility]) urologist-  dr Stephanie Acre   Borderline diabetes    Bursitis of both shoulders    CAD (coronary artery disease), native coronary artery    Cath 03/2019 with 20% prox and 60% mid LAD stenosis >>medical management recommended   Chronic systolic CHF (congestive heart failure), NYHA class 2 (HCC)    EF 29% by cMRI>>improved to 50-55% by echo 02/2021   Diabetes mellitus without complication (HCC)    Disorder of male genital organs, unspecified    Dyspnea    Esophagus disorder    Constricted Esophagus, per New Patient Packet-PSC    History of basal cell carcinoma (BCC) excision    05/ 2018  right forehead   HLD (hyperlipidemia)    Hypertension    Mild acid reflux no meds   Nocturia    OSA (obstructive sleep apnea)    per pt has not used cpap in years   Renal cyst, left 11/2011   16 mm   Sleep apnea    Per New Patient Packet-PSC    Smokers' cough (HCC)    SOB (shortness of breath) on exertion    Past Surgical History:  Procedure Laterality Date   CYSTOSCOPY WITH BIOPSY  03/13/2012   Procedure: CYSTOSCOPY WITH  BIOPSY;  Surgeon: Antony Haste, MD;  Location: [facility];  Service: Urology;  Laterality: N/A;      ESOPHAGEAL DILATION  05/20/1986   Esophagus Stretch, per New Patient Packet-PSC    RIGHT/LEFT HEART CATH AND CORONARY ANGIOGRAPHY N/A 04/16/2019   Procedure: RIGHT/LEFT HEART CATH AND CORONARY ANGIOGRAPHY;  Surgeon: Lennette Bihari, MD;  [facility];  Service: Cardiovascular;  Laterality: N/A;   SHOULDER ARTHROSCOPY WITH ROTATOR CUFF REPAIR Left 03/15/2021   Procedure: Left shoulder arthroscopy, debridement, subacromial decompression, distal clavicle resection, possible rotator cuff repair;  Surgeon:  Francena Hanly, MD;  Location: [facility];  Service: Orthopedics;  Laterality: Left;   TONSILLECTOMY  age 55   TRANSURETHRAL RESECTION OF BLADDER TUMOR N/A 05/23/2017   Procedure: TRANSURETHRAL RESECTION OF BLADDER TUMOR / (TURBT) WITH INSTILLATION OF EPIRUBICIN;  Surgeon: Jerilee Field, MD;  Location: [facility];  Service: Urology;  Laterality: N/A;   UVULECTOMY  05/20/1990   Dr.Byers, per new patient packet    UVULOPALATOPHARYNGOPLASTY  2005   osa   Social History:   reports that he has been smoking cigarettes. He has a 23.5 pack-year smoking history. He quit smokeless tobacco use about 54 years ago.  His smokeless tobacco use included chew. He reports current alcohol use. He reports that he does not currently use drugs.  Family History  Problem Relation Age of Onset   Diabetes Mother    Dementia Mother    Stroke Mother    Arthritis Mother    Hypertension Mother    Multiple sclerosis Father    Diabetes Father    Diabetes Sister    Hypertension Sister    ADD / ADHD Son    Migraines Daughter    ADD / ADHD Daughter    Cancer Maternal Grandmother     Medications: Patient's Medications  New Prescriptions   No medications on file  Previous Medications   ACCU-CHEK GUIDE TEST STRIP    USE TO TEST BLOOD SUGAR DAILY.   ACCU-CHEK SOFTCLIX LANCETS LANCETS    USE TO TEST BLOOD SUGAR DAILY.   ACETAMINOPHEN (TYLENOL) 500 MG TABLET    Take 2 tablets (1,000 mg total) by mouth every 8 (eight) hours as needed for moderate pain.   ALBUTEROL (VENTOLIN HFA) 108 (90 BASE) MCG/ACT INHALER    Inhale 2 puffs into the lungs every 6 (six) hours as needed for wheezing or shortness of breath.   ASPIRIN 81 MG CHEWABLE TABLET    Chew 1 tablet (81 mg total) by mouth daily.   ATORVASTATIN (LIPITOR) 80 MG TABLET    TAKE ONE TABLET BY MOUTH ONCE DAILY AT 6PM.   CARISOPRODOL (SOMA) 350 MG TABLET    Take 1 tablet (350 mg total) by mouth at bedtime as needed for muscle spasms.   DAPAGLIFLOZIN  PROPANEDIOL (FARXIGA) 10 MG TABS TABLET    Take 1 tablet (10 mg total) by mouth daily before breakfast. Please keep scheduled appointment for future refills. Thank you.   FUROSEMIDE (LASIX) 20 MG TABLET    Take 1 tablet (20 mg total) by mouth daily.   GLIPIZIDE (GLUCOTROL) 5 MG TABLET    TAKE (1) TABLET BY MOUTH TWICE DAILY.   GLUCOSAMINE-CHONDROITIN-MSM (GLUCOS-CHOND-MSM-DOUBLE STR PO)    Take 2 tablets by mouth daily.   LEVOCETIRIZINE (XYZAL) 5 MG TABLET    Take 5 mg by mouth daily as needed for allergies.   METFORMIN (GLUCOPHAGE) 1000 MG TABLET    TAKE (1) TABLET BY  MOUTH TWICE A DAY WITH MEALS (BREAKFAST AND SUPPER).   METOPROLOL SUCCINATE (TOPROL-XL) 100 MG 24 HR TABLET    Take 1 tablet (100 mg total) by mouth daily.   METOPROLOL SUCCINATE (TOPROL-XL) 25 MG 24 HR TABLET    TAKE (1) TABLET BY MOUTH ONCE DAILY.   MULTIPLE VITAMIN (MULTIVITAMIN WITH MINERALS) TABS TABLET    Take 1 tablet by mouth daily.   POTASSIUM CHLORIDE SA (KLOR-CON M) 20 MEQ TABLET    TAKE (2) TABLETS BY MOUTH ONCE DAILY.   SACUBITRIL-VALSARTAN (ENTRESTO) 97-103 MG    Take 1 tablet by mouth 2 (two) times daily.   SPIRONOLACTONE (ALDACTONE) 25 MG TABLET    TAKE (1) TABLET BY MOUTH ONCE DAILY.   TRULICITY 0.75 MG/0.5ML SOPN    INJECT 1 SYRINGE (50 MG) SUBCUTANEOUSLY ONCE A WEEK (ON THE SAME DAY OF EACH WEEK) AS DIRECTED.   UMECLIDINIUM-VILANTEROL (ANORO ELLIPTA) 62.5-25 MCG/ACT AEPB    INHALE 1 PUFF INTO THE LUNGS ONCE DAILY.  Modified Medications   No medications on file  Discontinued Medications   No medications on file    Physical Exam:  Vitals:   01/10/23 0847  BP: 110/68  Pulse: 90  Resp: 17  Temp: 97.8 F (36.6 C)  TempSrc: Temporal  SpO2: 94%  Weight: 238 lb (108 kg)  Height: 5\' 11"  (1.803 m)   Body mass index is 33.19 kg/m. Wt Readings from Last 3 Encounters:  01/10/23 238 lb (108 kg)  01/06/23 235 lb (106.6 kg)  05/13/22 242 lb 1 oz (109.8 kg)    Physical Exam Constitutional:      General: He  is not in acute distress.    Appearance: He is well-developed. He is not diaphoretic.  HENT:     Head: Normocephalic and atraumatic.     Right Ear: External ear normal.     Left Ear: External ear normal.     Mouth/Throat:     Pharynx: No oropharyngeal exudate.  Eyes:     Conjunctiva/sclera: Conjunctivae normal.     Pupils: Pupils are equal, round, and reactive to light.  Cardiovascular:     Rate and Rhythm: Normal rate and regular rhythm.     Heart sounds: Normal heart sounds.  Pulmonary:     Effort: Pulmonary effort is normal.     Breath sounds: Normal breath sounds.  Abdominal:     General: Bowel sounds are normal.     Palpations: Abdomen is soft.  Musculoskeletal:        General: No tenderness.     Cervical back: Normal range of motion and neck supple.     Right lower leg: No edema.     Left lower leg: No edema.  Skin:    General: Skin is warm and dry.  Neurological:     Mental Status: He is alert and oriented to person, place, and time.     Labs reviewed: Basic Metabolic Panel: Recent Labs    04/29/22 0917 05/13/22 0946 05/14/22 0050  NA 138 136 138  K 5.1 4.0 3.7  CL 103 102 108  CO2 30 21* 21*  GLUCOSE 115* 216* 124*  BUN 18 16 13   CREATININE 1.08 0.90 0.80  CALCIUM 10.2 9.5 8.9  MG  --   --  2.2  PHOS  --   --  3.1  TSH  --   --  0.895   Liver Function Tests: Recent Labs    04/29/22 0917 05/13/22 0946 05/14/22 0050  AST 14 12*  15  ALT 19 22 24   ALKPHOS  --  47 43  BILITOT 0.9 1.3* 1.6*  PROT 6.6 6.6 5.9*  ALBUMIN  --  4.3 3.6   No results for input(s): "LIPASE", "AMYLASE" in the last 8760 hours. No results for input(s): "AMMONIA" in the last 8760 hours. CBC: Recent Labs    04/29/22 0917 05/13/22 0946 05/14/22 0050 05/14/22 1527 05/15/22 0432  WBC 9.3 13.4* 12.5* 13.0* 11.3*  NEUTROABS 6,147 10.4*  --   --   --   HGB 17.4* 15.9 14.5 15.1 14.5  HCT 50.7* 47.0 44.0 44.6 43.1  MCV 90.4 90.9 93.2 92.0 92.9  PLT 304 202 186 223 191    Lipid Panel: Recent Labs    04/29/22 0917  CHOL 117  HDL 46  LDLCALC 49  TRIG 136  CHOLHDL 2.5   TSH: Recent Labs    05/14/22 0050  TSH 0.895   A1C: Lab Results  Component Value Date   HGBA1C 7.2 (H) 05/14/2022     Assessment/Plan 1. Type 2 diabetes mellitus with hyperglycemia, without long-term current use of insulin (HCC) -Encouraged dietary compliance, routine foot care/monitoring and to keep up with diabetic eye exams through ophthalmology  Continue medication as prescribed and will follow up labs today - Hemoglobin A1c  2. Chronic obstructive pulmonary disease, unspecified COPD type (HCC) Continues on anoro Encouraged smoking cessation  3. Chronic systolic CHF (congestive heart failure), NYHA class 2 (HCC) -euvolemic, will continue current regimen   4. Mixed hyperlipidemia -continues on lipitor 80 mg daily, encouraged dietary modifications.  - Lipid panel  5. Essential hypertension -Blood pressure well controlled, goal bp <140/90 Continue current medications and dietary modifications follow metabolic panel - COMPLETE METABOLIC PANEL WITH GFR - CBC with Differential/Platelet  6. Class 1 obesity due to excess calories with serious comorbidity and body mass index (BMI) of 33.0 to 33.9 in adult --education provided on healthy weight loss through increase in physical activity and proper nutrition   7. Atherosclerosis of native coronary artery of native heart with angina pectoris (HCC) -continues on asa and statin   Return in about 6 months (around 07/13/2023) for routine follow up .  Janene Harvey. Biagio Borg [facility] [phone]

## 2023-01-10 NOTE — Patient Instructions (Signed)
Quit smoking :)    Congratulations on the new grandbaby

## 2023-01-11 LAB — COMPLETE METABOLIC PANEL WITH GFR
AG Ratio: 2.4 (calc) (ref 1.0–2.5)
ALT: 22 U/L (ref 9–46)
AST: 16 U/L (ref 10–35)
Albumin: 4.5 g/dL (ref 3.6–5.1)
Alkaline phosphatase (APISO): 50 U/L (ref 35–144)
BUN: 20 mg/dL (ref 7–25)
CO2: 27 mmol/L (ref 20–32)
Calcium: 10.5 mg/dL — ABNORMAL HIGH (ref 8.6–10.3)
Chloride: 102 mmol/L (ref 98–110)
Creat: 1.21 mg/dL (ref 0.70–1.28)
Globulin: 1.9 g/dL (ref 1.9–3.7)
Glucose, Bld: 107 mg/dL (ref 65–139)
Potassium: 4.7 mmol/L (ref 3.5–5.3)
Sodium: 139 mmol/L (ref 135–146)
Total Bilirubin: 1.1 mg/dL (ref 0.2–1.2)
Total Protein: 6.4 g/dL (ref 6.1–8.1)
eGFR: 64 mL/min/{1.73_m2} (ref >=60)

## 2023-01-11 LAB — LIPID PANEL
Cholesterol: 99 mg/dL (ref <200)
HDL: 48 mg/dL (ref >=40)
LDL Cholesterol (Calc): 28 mg/dL
Non-HDL Cholesterol (Calc): 51 mg/dL (ref <130)
Total CHOL/HDL Ratio: 2.1 (calc) (ref <5.0)
Triglycerides: 144 mg/dL (ref <150)

## 2023-01-11 LAB — CBC WITH DIFFERENTIAL/PLATELET
Absolute Monocytes: 647 {cells}/uL (ref 200–950)
Basophils Absolute: 39 {cells}/uL (ref 0–200)
Basophils Relative: 0.4 %
Eosinophils Absolute: 627 {cells}/uL — ABNORMAL HIGH (ref 15–500)
Eosinophils Relative: 6.4 %
HCT: 51.2 % — ABNORMAL HIGH (ref 38.5–50.0)
Hemoglobin: 16.7 g/dL (ref 13.2–17.1)
Lymphs Abs: 1823 cells/uL (ref 850–3900)
MCH: 30.1 pg (ref 27.0–33.0)
MCHC: 32.6 g/dL (ref 32.0–36.0)
MCV: 92.4 fL (ref 80.0–100.0)
MPV: 10.3 fL (ref 7.5–12.5)
Monocytes Relative: 6.6 %
Neutro Abs: 6664 {cells}/uL (ref 1500–7800)
Neutrophils Relative %: 68 %
Platelets: 273 10*3/uL (ref 140–400)
RBC: 5.54 10*6/uL (ref 4.20–5.80)
RDW: 12.3 % (ref 11.0–15.0)
Total Lymphocyte: 18.6 %
WBC: 9.8 10*3/uL (ref 3.8–10.8)

## 2023-01-11 LAB — HEMOGLOBIN A1C
Hgb A1c MFr Bld: 6.9 %{Hb} — ABNORMAL HIGH (ref <5.7)
Mean Plasma Glucose: 151 mg/dL
eAG (mmol/L): 8.4 mmol/L

## 2023-01-21 ENCOUNTER — Other Ambulatory Visit: Payer: Self-pay | Admitting: Nurse Practitioner

## 2023-01-21 DIAGNOSIS — E1165 Type 2 diabetes mellitus with hyperglycemia: Secondary | ICD-10-CM

## 2023-01-21 DIAGNOSIS — I5022 Chronic systolic (congestive) heart failure: Secondary | ICD-10-CM

## 2023-02-10 ENCOUNTER — Other Ambulatory Visit: Payer: Self-pay | Admitting: Cardiology

## 2023-02-13 ENCOUNTER — Encounter: Payer: Medicare Other | Admitting: Nurse Practitioner

## 2023-02-13 ENCOUNTER — Other Ambulatory Visit: Payer: Self-pay | Admitting: Cardiology

## 2023-02-13 NOTE — Progress Notes (Signed)
error 

## 2023-02-14 ENCOUNTER — Other Ambulatory Visit: Payer: Self-pay | Admitting: Nurse Practitioner

## 2023-02-14 DIAGNOSIS — J449 Chronic obstructive pulmonary disease, unspecified: Secondary | ICD-10-CM

## 2023-02-14 NOTE — Progress Notes (Signed)
This encounter was created in error - please disregard.

## 2023-02-19 ENCOUNTER — Other Ambulatory Visit: Payer: Self-pay | Admitting: Nurse Practitioner

## 2023-02-19 DIAGNOSIS — I5022 Chronic systolic (congestive) heart failure: Secondary | ICD-10-CM

## 2023-02-19 DIAGNOSIS — E1165 Type 2 diabetes mellitus with hyperglycemia: Secondary | ICD-10-CM

## 2023-03-01 ENCOUNTER — Other Ambulatory Visit: Payer: Self-pay | Admitting: Nurse Practitioner

## 2023-03-01 DIAGNOSIS — J209 Acute bronchitis, unspecified: Secondary | ICD-10-CM

## 2023-03-19 ENCOUNTER — Other Ambulatory Visit: Payer: Self-pay | Admitting: Cardiology

## 2023-05-01 ENCOUNTER — Other Ambulatory Visit: Payer: Self-pay | Admitting: Cardiology

## 2023-05-31 ENCOUNTER — Other Ambulatory Visit: Payer: Self-pay | Admitting: Cardiology

## 2023-06-10 ENCOUNTER — Other Ambulatory Visit: Payer: Self-pay | Admitting: Nurse Practitioner

## 2023-06-15 ENCOUNTER — Other Ambulatory Visit: Payer: Self-pay | Admitting: Nurse Practitioner

## 2023-06-15 DIAGNOSIS — J209 Acute bronchitis, unspecified: Secondary | ICD-10-CM

## 2023-06-25 ENCOUNTER — Other Ambulatory Visit: Payer: Self-pay | Admitting: Nurse Practitioner

## 2023-06-25 DIAGNOSIS — E1165 Type 2 diabetes mellitus with hyperglycemia: Secondary | ICD-10-CM

## 2023-06-25 NOTE — Telephone Encounter (Signed)
 Patient hasn't had refill since 2023. Medication pend and sent to PCP Roselie Conger [place], NP

## 2023-07-11 DIAGNOSIS — M79641 Pain in right hand: Secondary | ICD-10-CM | POA: Diagnosis not present

## 2023-07-14 ENCOUNTER — Encounter: Payer: Self-pay | Admitting: Nurse Practitioner

## 2023-07-14 ENCOUNTER — Ambulatory Visit (INDEPENDENT_AMBULATORY_CARE_PROVIDER_SITE_OTHER): Payer: Medicare Other | Admitting: Nurse Practitioner

## 2023-07-14 VITALS — BP 122/80 | HR 89 | Temp 98.4°F | Resp 17 | Ht 71.0 in | Wt 235.6 lb

## 2023-07-14 DIAGNOSIS — E1165 Type 2 diabetes mellitus with hyperglycemia: Secondary | ICD-10-CM | POA: Diagnosis not present

## 2023-07-14 DIAGNOSIS — Z720 Tobacco use: Secondary | ICD-10-CM | POA: Diagnosis not present

## 2023-07-14 DIAGNOSIS — J449 Chronic obstructive pulmonary disease, unspecified: Secondary | ICD-10-CM | POA: Diagnosis not present

## 2023-07-14 DIAGNOSIS — E782 Mixed hyperlipidemia: Secondary | ICD-10-CM | POA: Diagnosis not present

## 2023-07-14 DIAGNOSIS — E66811 Obesity, class 1: Secondary | ICD-10-CM

## 2023-07-14 DIAGNOSIS — I5022 Chronic systolic (congestive) heart failure: Secondary | ICD-10-CM | POA: Diagnosis not present

## 2023-07-14 DIAGNOSIS — I25119 Atherosclerotic heart disease of native coronary artery with unspecified angina pectoris: Secondary | ICD-10-CM

## 2023-07-14 DIAGNOSIS — I10 Essential (primary) hypertension: Secondary | ICD-10-CM

## 2023-07-14 NOTE — Progress Notes (Signed)
 Careteam: Patient Care Team: Sharon Seller, NP as PCP - General (Geriatric Medicine) Quintella Reichert, MD as PCP - Cardiology (Cardiology) Suzanna Obey, MD as Consulting Physician (Otolaryngology) Jerilee Field, MD as Consulting Physician (Urology) Sallye Lat, MD as Consulting Physician (Ophthalmology)   PLACE OF SERVICE: [facility]  Advanced Directive information Does Patient Have a Medical Advance Directive?: No, Would patient like information on creating a medical advance directive?: No - Patient declined   Allergies  Allergen Reactions   Chantix [Varenicline Tartrate]     Aggression      Chief Complaint  Patient presents with   Medical Management of Chronic Issues    6 month. Discuss recommendations for AWV, Lung cancer screening (refused), A1c, shingrix, covid, and MALB. Patient was not fully aware of his medications, states " My wife handles my medications."      HPI: Patient is a 73 y.o. male presents for 6 month follow-up. Pt with a complex medical hx of DM, OSA, tobacco abuse, COPD, CHF.   Reports he had a lingering cough for about 4 months which is finally improving.   Has cardiologist that he sees once a year.   States he had a gout flare up last week on right hand, had knee and heel pain as well, he went to ortho but didn't take any medications, states he treated symptoms at home.   States he's still very active, he's self employed and stays busy. Does some home exercises such as squats and arm curls  Doesn't follow any dietary restrictions.   DM- does not check CBGs at home, only when he wants to  Smoking- has improved, now 1 pack every 3 days Does not have concerns at this time  Review of Systems:  Review of Systems  Constitutional:  Negative for chills, fever and weight loss.  Respiratory:  Positive for cough and shortness of breath (with exertion).   Cardiovascular:  Negative for chest pain, palpitations and leg swelling.  Gastrointestinal:   Negative for constipation, diarrhea, nausea and vomiting.  Genitourinary:  Negative for dysuria, frequency and urgency.  Neurological:  Negative for dizziness, weakness and headaches.  Psychiatric/Behavioral:  Negative for depression. The patient is not nervous/anxious and does not have insomnia.     Past Medical History:  Diagnosis Date   Arthritis    Bladder cancer [facility]) urologist-  dr Stephanie Acre   Borderline diabetes    Bursitis of both shoulders    CAD (coronary artery disease), native coronary artery    Cath 03/2019 with 20% prox and 60% mid LAD stenosis >>medical management recommended   Chronic systolic CHF (congestive heart failure), NYHA class 2 (HCC)    EF 29% by cMRI>>improved to 50-55% by echo 02/2021   Diabetes mellitus without complication (HCC)    Disorder of male genital organs, unspecified    Dyspnea    Esophagus disorder    Constricted Esophagus, per New Patient Packet-PSC    History of basal cell carcinoma (BCC) excision    05/ 2018  right forehead   HLD (hyperlipidemia)    Hypertension    Mild acid reflux no meds   Nocturia    OSA (obstructive sleep apnea)    per pt has not used cpap in years   Renal cyst, left 11/2011   16 mm   Sleep apnea    Per New Patient Packet-PSC    Smokers' cough (HCC)    SOB (shortness of breath) on exertion     Past Surgical History:  Procedure Laterality Date   CYSTOSCOPY WITH BIOPSY  03/13/2012   Procedure: CYSTOSCOPY WITH BIOPSY;  Surgeon: Antony Haste, MD;  Location: [facility];  Service: Urology;  Laterality: N/A;      ESOPHAGEAL DILATION  05/20/1986   Esophagus Stretch, per New Patient Packet-PSC    RIGHT/LEFT HEART CATH AND CORONARY ANGIOGRAPHY N/A 04/16/2019   Procedure: RIGHT/LEFT HEART CATH AND CORONARY ANGIOGRAPHY;  Surgeon: Lennette Bihari, MD;  [facility];  Service: Cardiovascular;  Laterality: N/A;   SHOULDER ARTHROSCOPY WITH ROTATOR CUFF REPAIR Left 03/15/2021    Procedure: Left shoulder arthroscopy, debridement, subacromial decompression, distal clavicle resection, possible rotator cuff repair;  Surgeon: Francena Hanly, MD;  Location: [facility];  Service: Orthopedics;  Laterality: Left;   TONSILLECTOMY  age 54   TRANSURETHRAL RESECTION OF BLADDER TUMOR N/A 05/23/2017   Procedure: TRANSURETHRAL RESECTION OF BLADDER TUMOR / (TURBT) WITH INSTILLATION OF EPIRUBICIN;  Surgeon: Jerilee Field, MD;  Location: [facility];  Service: Urology;  Laterality: N/A;   UVULECTOMY  05/20/1990   Dr.Byers, per new patient packet    UVULOPALATOPHARYNGOPLASTY  2005   osa     Social History:   reports that he has been smoking cigarettes. He has a 23.5 pack-year smoking history. He quit smokeless tobacco use about 55 years ago.  His smokeless tobacco use included chew. He reports current alcohol use. He reports that he does not currently use drugs.  Family History  Problem Relation Age of Onset   Diabetes Mother    Dementia Mother    Stroke Mother    Arthritis Mother    Hypertension Mother    Multiple sclerosis Father    Diabetes Father    Diabetes Sister    Hypertension Sister    ADD / ADHD Son    Migraines Daughter    ADD / ADHD Daughter    Cancer Maternal Grandmother      Medications:  Patient's Medications  New Prescriptions   No medications on file  Previous Medications   ACCU-CHEK GUIDE TEST STRIP    USE TO TEST BLOOD SUGAR DAILY.   ACCU-CHEK SOFTCLIX LANCETS LANCETS    USE TO TEST BLOOD SUGAR DAILY.   ACETAMINOPHEN (TYLENOL) 500 MG TABLET    Take 2 tablets (1,000 mg total) by mouth every 8 (eight) hours as needed for moderate pain.   ALBUTEROL (VENTOLIN HFA) 108 (90 BASE) MCG/ACT INHALER    INHALE 2 PUFFS INTO THE LUNGS EVERY 6 HOURS AS NEEDED FOR WHEEZING OR SHORTNESS OF BREATH.   ASPIRIN 81 MG CHEWABLE TABLET    Chew 1 tablet (81 mg total) by mouth daily.   ATORVASTATIN (LIPITOR) 80 MG TABLET    TAKE ONE TABLET BY MOUTH ONCE DAILY AT 6PM.    CARISOPRODOL (SOMA) 350 MG TABLET    Take 1 tablet (350 mg total) by mouth at bedtime as needed for muscle spasms.   DULAGLUTIDE (TRULICITY) 0.75 MG/0.5ML SOPN    INJECT 1 SYRINGE (50 MG) SUBCUTANEOUSLY ONCE A WEEK (ON THE SAME DAY OF EACH WEEK) AS DIRECTED.   ENTRESTO 97-103 MG    TAKE (1) TABLET BY MOUTH TWICE DAILY.   FARXIGA 10 MG TABS TABLET    Take 1 tablet (10 mg total) by mouth daily before breakfast. Please keep scheduled appointment for future refills. Thank you.   FUROSEMIDE (LASIX) 20 MG TABLET    TAKE 1 TABLET BY MOUTH DAILY.   GLIPIZIDE (GLUCOTROL) 5 MG TABLET  TAKE (1) TABLET BY MOUTH TWICE DAILY.   GLUCOSAMINE-CHONDROITIN-MSM (GLUCOS-CHOND-MSM-DOUBLE STR PO)    Take 2 tablets by mouth daily.   LEVOCETIRIZINE (XYZAL) 5 MG TABLET    Take 5 mg by mouth daily as needed for allergies.   METFORMIN (GLUCOPHAGE) 1000 MG TABLET    TAKE (1) TABLET BY MOUTH TWICE A DAY WITH MEALS (BREAKFAST AND SUPPER).   METOPROLOL SUCCINATE (TOPROL-XL) 100 MG 24 HR TABLET    TAKE (1) TABLET BY MOUTH ONCE DAILY. MD REQUESTS APPOINTMENT.   METOPROLOL SUCCINATE (TOPROL-XL) 25 MG 24 HR TABLET    TAKE (1) TABLET BY MOUTH ONCE DAILY.   MULTIPLE VITAMIN (MULTIVITAMIN WITH MINERALS) TABS TABLET    Take 1 tablet by mouth daily.   POTASSIUM CHLORIDE SA (KLOR-CON M) 20 MEQ TABLET    TAKE (2) TABLETS BY MOUTH ONCE DAILY.   SPIRONOLACTONE (ALDACTONE) 25 MG TABLET    TAKE (1) TABLET BY MOUTH ONCE DAILY.   UMECLIDINIUM-VILANTEROL (ANORO ELLIPTA) 62.5-25 MCG/ACT AEPB    INHALE 1 PUFF INTO THE LUNGS ONCE DAILY.  Modified Medications   No medications on file  Discontinued Medications   No medications on file     Physical Exam:  Vitals:   07/14/23 0935  BP: 122/80  Pulse: 89  Resp: 17  Temp: 98.4 F (36.9 C)  SpO2: 95%  Weight: 235 lb 9.6 oz (106.9 kg)  Height: 5\' 11"  (1.803 m)   Body mass index is 32.86 kg/m.  Wt Readings from Last 3 Encounters:  07/14/23 235 lb 9.6 oz (106.9 kg)  01/10/23 238 lb (108  kg)  01/06/23 235 lb (106.6 kg)     Physical Exam Constitutional:      Appearance: Normal appearance.  Cardiovascular:     Rate and Rhythm: Normal rate and regular rhythm.     Pulses: Normal pulses.     Heart sounds: Normal heart sounds.  Pulmonary:     Effort: Pulmonary effort is normal. No respiratory distress.     Breath sounds: Wheezing (right lower lobe) present.  Abdominal:     General: Bowel sounds are normal.     Palpations: Abdomen is soft.  Skin:    General: Skin is warm and dry.  Neurological:     General: No focal deficit present.     Mental Status: He is alert and oriented to person, place, and time.  Psychiatric:        Mood and Affect: Mood normal.        Behavior: Behavior normal.     Labs reviewed: Basic Metabolic Panel:  Recent Labs    01/10/23 0918  NA 139  K 4.7  CL 102  CO2 27  GLUCOSE 107  BUN 20  CREATININE 1.21  CALCIUM 10.5*   Liver Function Tests:  Recent Labs    01/10/23 0918  AST 16  ALT 22  BILITOT 1.1  PROT 6.4   No results for input(s): "LIPASE", "AMYLASE" in the last 8760 hours. No results for input(s): "AMMONIA" in the last 8760 hours. CBC:  Recent Labs    01/10/23 0918  WBC 9.8  NEUTROABS 6,664  HGB 16.7  HCT 51.2*  MCV 92.4  PLT 273   Lipid Panel:  Recent Labs    01/10/23 0918  CHOL 99  HDL 48  LDLCALC 28  TRIG 144  CHOLHDL 2.1   TSH: No results for input(s): "TSH" in the last 8760 hours. A1C:  Lab Results  Component Value Date   HGBA1C 6.9 (H)  01/10/2023     Assessment/Plan  1. Type 2 diabetes mellitus with hyperglycemia, without long-term current use of insulin (HCC) (Primary) -Encouraged dietary modifications, physical activity as tolerated and routine foot care -Up to date on eye exam - Hemoglobin A1c - Microalbumin/Creatinine Ratio, Urine -Continue current medications -Encouraged dietary compliance, routine foot care/monitoring and to keep up with diabetic eye exams through ophthalmology   2.  Chronic obstructive pulmonary disease, unspecified COPD type (HCC) -Does not have pulmonologist, states he's compliant with inhalers -Some shortness of breath with moderate exertion, no other symptoms -Encouraged proper nutrition/hydration -Encouraged smoking cessation  3. Mixed hyperlipidemia -Encouraged dietary modification and physical activity as tolerated - Lipid panel Continues on lipitor - COMPLETE METABOLIC PANEL WITH GFR -Encouraged smoking cessation  4. Essential hypertension -Controlled, at goal, <140/90 -Continue current medications; followed by cardiologist - COMPLETE METABOLIC PANEL WITH GFR - CBC with Differential/Platelet -Encouraged smoking cessation  5. Chronic systolic CHF (congestive heart failure), NYHA class 2 (HCC) -Euvolemic, no signs of overload, no peripheral swelling -Encouraged dietary modification and physical activity as tolerated -Continue care with cardiology  6. Atherosclerosis of native coronary artery of native heart with angina pectoris (HCC) -Continue statin and ASA -Encouraged smoking cessation  7. Tobacco abuse -Encouraged tobacco cessation -States he has cut back, is down to 1 pack every 3 days, continue to work on decreasing tobacco use  8. Obesity (BMI 30.0-34.9) -Encouraged dietary modification and physical activity as tolerated -States he has lost some weight since last visit  Return in about 6 months (around 01/11/2024) for routine follow up, labs at time of visit.  Rollen Sox, Haroldine Laws MSN-FNP Student -I personally was present during the history, physical exam and medical decision-making activities of this service and have verified that the service and findings are accurately documented in the student's note Deric Bocock K. Biagio Borg [facility] [phone]

## 2023-07-15 ENCOUNTER — Encounter: Payer: Self-pay | Admitting: Nurse Practitioner

## 2023-07-15 LAB — CBC WITH DIFFERENTIAL/PLATELET
Absolute Lymphocytes: 1633 {cells}/uL (ref 850–3900)
Absolute Monocytes: 790 {cells}/uL (ref 200–950)
Basophils Absolute: 52 {cells}/uL (ref 0–200)
Basophils Relative: 0.5 %
Eosinophils Absolute: 634 {cells}/uL — ABNORMAL HIGH (ref 15–500)
Eosinophils Relative: 6.1 %
HCT: 48.2 % (ref 38.5–50.0)
Hemoglobin: 16.2 g/dL (ref 13.2–17.1)
MCH: 30.8 pg (ref 27.0–33.0)
MCHC: 33.6 g/dL (ref 32.0–36.0)
MCV: 91.6 fL (ref 80.0–100.0)
MPV: 9.8 fL (ref 7.5–12.5)
Monocytes Relative: 7.6 %
Neutro Abs: 7290 {cells}/uL (ref 1500–7800)
Neutrophils Relative %: 70.1 %
Platelets: 318 10*3/uL (ref 140–400)
RBC: 5.26 10*6/uL (ref 4.20–5.80)
RDW: 12 % (ref 11.0–15.0)
Total Lymphocyte: 15.7 %
WBC: 10.4 10*3/uL (ref 3.8–10.8)

## 2023-07-15 LAB — COMPLETE METABOLIC PANEL WITH GFR
AG Ratio: 2.1 (calc) (ref 1.0–2.5)
ALT: 22 U/L (ref 9–46)
AST: 15 U/L (ref 10–35)
Albumin: 4.4 g/dL (ref 3.6–5.1)
Alkaline phosphatase (APISO): 57 U/L (ref 35–144)
BUN: 12 mg/dL (ref 7–25)
CO2: 29 mmol/L (ref 20–32)
Calcium: 10.1 mg/dL (ref 8.6–10.3)
Chloride: 101 mmol/L (ref 98–110)
Creat: 1.14 mg/dL (ref 0.70–1.28)
Globulin: 2.1 g/dL (ref 1.9–3.7)
Glucose, Bld: 100 mg/dL — ABNORMAL HIGH (ref 65–99)
Potassium: 4.6 mmol/L (ref 3.5–5.3)
Sodium: 140 mmol/L (ref 135–146)
Total Bilirubin: 1.6 mg/dL — ABNORMAL HIGH (ref 0.2–1.2)
Total Protein: 6.5 g/dL (ref 6.1–8.1)
eGFR: 68 mL/min/{1.73_m2} (ref >=60)

## 2023-07-15 LAB — LIPID PANEL
Cholesterol: 80 mg/dL (ref <200)
HDL: 40 mg/dL (ref >=40)
LDL Cholesterol (Calc): 24 mg/dL
Non-HDL Cholesterol (Calc): 40 mg/dL (ref <130)
Total CHOL/HDL Ratio: 2 (calc) (ref <5.0)
Triglycerides: 79 mg/dL (ref <150)

## 2023-07-15 LAB — HEMOGLOBIN A1C
Hgb A1c MFr Bld: 6.7 %{Hb} — ABNORMAL HIGH (ref <5.7)
Mean Plasma Glucose: 146 mg/dL
eAG (mmol/L): 8.1 mmol/L

## 2023-07-22 ENCOUNTER — Ambulatory Visit (INDEPENDENT_AMBULATORY_CARE_PROVIDER_SITE_OTHER): Payer: Medicare Other | Admitting: Nurse Practitioner

## 2023-07-22 ENCOUNTER — Telehealth: Payer: Self-pay | Admitting: *Deleted

## 2023-07-22 DIAGNOSIS — Z0000 Encounter for general adult medical examination without abnormal findings: Secondary | ICD-10-CM

## 2023-07-22 NOTE — Telephone Encounter (Signed)
 [name], Timothy Lee are scheduled for a virtual visit with your provider today.    Just as we do with appointments in the office, we must obtain your consent to participate.  Your consent will be active for this visit and any virtual visit you Timothy Lee have with one of our providers in the next 365 days.    If you have a MyChart account, I can also send a copy of this consent to you electronically.  All virtual visits are billed to your insurance company just like a traditional visit in the office.  As this is a virtual visit, video technology does not allow for your provider to perform a traditional examination.  This Timothy Lee limit your provider's ability to fully assess your condition.  If your provider identifies any concerns that need to be evaluated in person or the need to arrange testing such as labs, EKG, etc, we will make arrangements to do so.    Although advances in technology are sophisticated, we cannot ensure that it will always work on either your end or our end.  If the connection with a video visit is poor, we Timothy Lee have to switch to a telephone visit.  With either a video or telephone visit, we are not always able to ensure that we have a secure connection.   I need to obtain your verbal consent now.   Are you willing to proceed with your visit today?   Timothy Lee has provided verbal consent on 07/22/2023 for a virtual visit (video or telephone).   Timothy Lee, [place] 07/22/2023  10:13 AM

## 2023-07-22 NOTE — Progress Notes (Signed)
 This service is provided via telemedicine  No vital signs collected/recorded due to the encounter was a telemedicine visit.   Location of patient (ex: home, work):  Home  Patient consents to a telephone visit:  Yes  Location of the provider (ex: office, home):  Office [place].   Name of any referring provider:  na  Names of all persons participating in the telemedicine service and their role in the encounter:  Timothy Lee, Patient, Nelda Severe, CMA, Ruben Gottron, NP  Time spent on call:  7:11

## 2023-07-22 NOTE — Patient Instructions (Signed)
  Mr. Hayashi , Thank you for taking time to come for your Medicare Wellness Visit. I appreciate your ongoing commitment to your health goals. Please review the following plan we discussed and let me know if I can assist you in the future.   To check on shingles vaccine to see if you had done at local pharmacy Have them send records If you have not had done get it at the pharmacy    This is a list of the screening recommended for you and due dates:  Health Maintenance  Topic Date Due   Zoster (Shingles) Vaccine (1 of 2) Never done   Yearly kidney health urinalysis for diabetes  04/30/2023   Screening for Lung Cancer  07/13/2024*   COVID-19 Vaccine (3 - 2024-25 season) 07/21/2024*   Eye exam for diabetics  11/08/2023   Complete foot exam   01/10/2024   Hemoglobin A1C  01/11/2024   Yearly kidney function blood test for diabetes  07/13/2024   Medicare Annual Wellness Visit  07/21/2024   Cologuard (Stool DNA test)  02/25/2025   DTaP/Tdap/Td vaccine (2 - Td or Tdap) 05/13/2032   Pneumonia Vaccine  Completed   HPV Vaccine  Aged Out   Flu Shot  Discontinued   Hepatitis C Screening  Discontinued  *Topic was postponed. The date shown is not the original due date.

## 2023-07-22 NOTE — Progress Notes (Signed)
 Subjective:   Timothy Lee is a 73 y.o. male who presents for Medicare Annual/Subsequent preventive examination.  Visit Complete: Virtual I connected with  Nelly Laurence on 07/22/23 by a video and audio enabled telemedicine application and verified that I am speaking with the correct person using two identifiers.  Patient Location: Home  Provider Location: Office/Clinic  I discussed the limitations of evaluation and management by telemedicine. The patient expressed understanding and agreed to proceed.  Vital Signs: Because this visit was a virtual/telehealth visit, some criteria may be missing or patient reported. Any vitals not documented were not able to be obtained and vitals that have been documented are patient reported.  Cardiac Risk Factors include: male gender;diabetes mellitus;dyslipidemia;hypertension;obesity (BMI >30kg/m2);smoking/ tobacco exposure     Objective:    There were no vitals filed for this visit. There is no height or weight on file to calculate BMI.     07/22/2023   10:06 AM 07/14/2023    9:32 AM 01/10/2023    8:45 AM 05/13/2022   11:19 PM 05/13/2022    9:44 AM 04/29/2022    8:48 AM 02/07/2022    8:57 AM  Advanced Directives  Does Patient Have a Medical Advance Directive? No No No No No No No  Would patient like information on creating a medical advance directive? No - Patient declined No - Patient declined  No - Patient declined  Yes (MAU/Ambulatory/Procedural Areas - Information given)     Current Medications (verified) Outpatient Encounter Medications as of 07/22/2023  Medication Sig   ACCU-CHEK GUIDE test strip USE TO TEST BLOOD SUGAR DAILY.   Accu-Chek Softclix Lancets lancets USE TO TEST BLOOD SUGAR DAILY.   acetaminophen (TYLENOL) 500 MG tablet Take 2 tablets (1,000 mg total) by mouth every 8 (eight) hours as needed for moderate pain.   albuterol (VENTOLIN HFA) 108 (90 Base) MCG/ACT inhaler INHALE 2 PUFFS INTO THE LUNGS EVERY 6 HOURS AS  NEEDED FOR WHEEZING OR SHORTNESS OF BREATH.   aspirin 81 MG chewable tablet Chew 1 tablet (81 mg total) by mouth daily.   atorvastatin (LIPITOR) 80 MG tablet TAKE ONE TABLET BY MOUTH ONCE DAILY AT 6PM.   carisoprodol (SOMA) 350 MG tablet Take 1 tablet (350 mg total) by mouth at bedtime as needed for muscle spasms.   Dulaglutide (TRULICITY) 0.75 MG/0.5ML SOPN INJECT 1 SYRINGE (50 MG) SUBCUTANEOUSLY ONCE A WEEK (ON THE SAME DAY OF EACH WEEK) AS DIRECTED.   ENTRESTO 97-103 MG TAKE (1) TABLET BY MOUTH TWICE DAILY.   FARXIGA 10 MG TABS tablet Take 1 tablet (10 mg total) by mouth daily before breakfast. Please keep scheduled appointment for future refills. Thank you.   furosemide (LASIX) 20 MG tablet TAKE 1 TABLET BY MOUTH DAILY.   glipiZIDE (GLUCOTROL) 5 MG tablet TAKE (1) TABLET BY MOUTH TWICE DAILY.   Glucosamine-Chondroitin-MSM (GLUCOS-CHOND-MSM-DOUBLE STR PO) Take 2 tablets by mouth daily.   levocetirizine (XYZAL) 5 MG tablet Take 5 mg by mouth daily as needed for allergies.   metFORMIN (GLUCOPHAGE) 1000 MG tablet TAKE (1) TABLET BY MOUTH TWICE A DAY WITH MEALS (BREAKFAST AND SUPPER).   metoprolol succinate (TOPROL-XL) 100 MG 24 hr tablet TAKE (1) TABLET BY MOUTH ONCE DAILY. MD REQUESTS APPOINTMENT.   metoprolol succinate (TOPROL-XL) 25 MG 24 hr tablet TAKE (1) TABLET BY MOUTH ONCE DAILY.   Multiple Vitamin (MULTIVITAMIN WITH MINERALS) TABS tablet Take 1 tablet by mouth daily.   potassium chloride SA (KLOR-CON M) 20 MEQ tablet TAKE (2) TABLETS  BY MOUTH ONCE DAILY.   spironolactone (ALDACTONE) 25 MG tablet TAKE (1) TABLET BY MOUTH ONCE DAILY.   umeclidinium-vilanterol (ANORO ELLIPTA) 62.5-25 MCG/ACT AEPB INHALE 1 PUFF INTO THE LUNGS ONCE DAILY.   No facility-administered encounter medications on file as of 07/22/2023.    Allergies (verified) Chantix [varenicline tartrate]   History: Past Medical History:  Diagnosis Date   Arthritis    Bladder cancer [facility]) urologist-  dr Stephanie Acre   Borderline  diabetes    Bursitis of both shoulders    CAD (coronary artery disease), native coronary artery    Cath 03/2019 with 20% prox and 60% mid LAD stenosis >>medical management recommended   Chronic systolic CHF (congestive heart failure), NYHA class 2 (HCC)    EF 29% by cMRI>>improved to 50-55% by echo 02/2021   Diabetes mellitus without complication (HCC)    Disorder of male genital organs, unspecified    Dyspnea    Esophagus disorder    Constricted Esophagus, per New Patient Packet-PSC    History of basal cell carcinoma (BCC) excision    05/ 2018  right forehead   HLD (hyperlipidemia)    Hypertension    Mild acid reflux no meds   Nocturia    OSA (obstructive sleep apnea)    per pt has not used cpap in years   Renal cyst, left 11/2011   16 mm   Sleep apnea    Per New Patient Packet-PSC    Smokers' cough (HCC)    SOB (shortness of breath) on exertion    Past Surgical History:  Procedure Laterality Date   CYSTOSCOPY WITH BIOPSY  03/13/2012   Procedure: CYSTOSCOPY WITH BIOPSY;  Surgeon: Antony Haste, MD;  Location: [facility];  Service: Urology;  Laterality: N/A;      ESOPHAGEAL DILATION  05/20/1986   Esophagus Stretch, per New Patient Packet-PSC    RIGHT/LEFT HEART CATH AND CORONARY ANGIOGRAPHY N/A 04/16/2019   Procedure: RIGHT/LEFT HEART CATH AND CORONARY ANGIOGRAPHY;  Surgeon: Lennette Bihari, MD;  Location: [facility];  Service: Cardiovascular;  Laterality: N/A;   SHOULDER ARTHROSCOPY WITH ROTATOR CUFF REPAIR Left 03/15/2021   Procedure: Left shoulder arthroscopy, debridement, subacromial decompression, distal clavicle resection, possible rotator cuff repair;  Surgeon: Francena Hanly, MD;  Location: [facility];  Service: Orthopedics;  Laterality: Left;   TONSILLECTOMY  age 87   TRANSURETHRAL RESECTION OF BLADDER TUMOR N/A 05/23/2017   Procedure: TRANSURETHRAL RESECTION OF BLADDER TUMOR / (TURBT) WITH INSTILLATION OF EPIRUBICIN;  Surgeon: Jerilee Field, MD;  Location: [facility];  Service: Urology;  Laterality: N/A;   UVULECTOMY  05/20/1990   Dr.Byers, per new patient packet    UVULOPALATOPHARYNGOPLASTY  2005   osa   Family History  Problem Relation Age of Onset   Diabetes Mother    Dementia Mother    Stroke Mother    Arthritis Mother    Hypertension Mother    Multiple sclerosis Father    Diabetes Father    Diabetes Sister    Hypertension Sister    ADD / ADHD Son    Migraines Daughter    ADD / ADHD Daughter    Cancer Maternal Grandmother    Social History   Socioeconomic History   Marital status: Married    Spouse name: Not on file   Number of children: Not on file   Years of education: Not on file   Highest education level: Some college, no degree  Occupational History   Not  on file  Tobacco Use   Smoking status: Every Day    Current packs/day: 0.50    Average packs/day: 0.5 packs/day for 47.0 years (23.5 ttl pk-yrs)    Types: Cigarettes   Smokeless tobacco: Former    Types: Chew    Quit date: 1970  Vaping Use   Vaping status: Former  Substance and Sexual Activity   Alcohol use: Yes    Comment: very seldom- rare   Drug use: Not Currently   Sexual activity: Not Currently  Other Topics Concern   Not on file  Social History Narrative   Diet: None      Caffeine: Yes      Married, if yes what year:  Yes, 1990      Do you live in a house, apartment, assisted living, condo, trailer, ect: House, 3 stories, 4 persons      Pets: 2 dogs, 2 cats       Highest level of education: Some Engineer, mining profession: Architect       Exercise: Some         Living Will: Yes   DNR: Yes   POA/HPOA: Yes      Functional Status:   Do you have difficulty bathing or dressing yourself? No   Do you have difficulty preparing food or eating? No   Do you have difficulty managing your medications? No   Do you have difficulty managing your finances? No   Do you have difficulty affording  your medications? No   Social Drivers of Corporate investment banker Strain: Low Risk  (07/22/2023)   Overall Financial Resource Strain (CARDIA)    Difficulty of Paying Living Expenses: Not hard at all  Food Insecurity: No Food Insecurity (07/22/2023)   Hunger Vital Sign    Worried About Running Out of Food in the Last Year: Never true    Ran Out of Food in the Last Year: Never true  Transportation Needs: No Transportation Needs (07/22/2023)   PRAPARE - Administrator, Civil Service (Medical): No    Lack of Transportation (Non-Medical): No  Physical Activity: Insufficiently Active (07/22/2023)   Exercise Vital Sign    Days of Exercise per Week: 3 days    Minutes of Exercise per Session: 20 min  Stress: No Stress Concern Present (07/22/2023)   [employer] of Occupational Health - Occupational Stress Questionnaire    Feeling of Stress : Not at all  Social Connections: Moderately Integrated (07/22/2023)   Social Connection and Isolation Panel [NHANES]    Frequency of Communication with Friends and Family: Three times a week    Frequency of Social Gatherings with Friends and Family: More than three times a week    Attends Religious Services: 1 to 4 times per year    Active Member of [employer] or Organizations: No    Attends Banker Meetings: Never    Marital Status: Married  Recent Concern: Social Connections - Moderately Isolated (07/22/2023)   Social Connection and Isolation Panel [NHANES]    Frequency of Communication with Friends and Family: Once a week    Frequency of Social Gatherings with Friends and Family: Once a week    Attends Religious Services: More than 4 times per year    Active Member of [employer] or Organizations: No    Attends Engineer, structural: Not on file    Marital Status: Married    Tobacco Counseling Ready to quit: Not Answered  Counseling given: Not Answered   Clinical Intake:  Pre-visit preparation completed: Yes  Pain :  No/denies pain     BMI - recorded: 32 Nutritional Status: BMI > 30  Obese Diabetes: Yes  How often do you need to have someone help you when you read instructions, pamphlets, or other written materials from your doctor or pharmacy?: 3 - Sometimes         Activities of Daily Living    07/22/2023    9:56 AM 07/22/2023    9:35 AM  In your present state of health, do you have any difficulty performing the following activities:  Hearing? 0 0  Vision? 0 0  Difficulty concentrating or making decisions? 0 0  Walking or climbing stairs? 0 0  Dressing or bathing? 0 0  Doing errands, shopping? 0 0  Preparing Food and eating ? N N  Using the Toilet? N N  In the past six months, have you accidently leaked urine? N N  Do you have problems with loss of bowel control? N N  Managing your Medications? N N  Managing your Finances? N N  Housekeeping or managing your Housekeeping? N N    Patient Care Team: Sharon Seller, NP as PCP - General (Geriatric Medicine) Quintella Reichert, MD as PCP - Cardiology (Cardiology) Suzanna Obey, MD as Consulting Physician (Otolaryngology) Jerilee Field, MD as Consulting Physician (Urology) Sallye Lat, MD as Consulting Physician (Ophthalmology)  Indicate any recent Medical Services you may have received from other than [facility] providers in the past year (date may be approximate).     Assessment:   This is a routine wellness examination for Gen.  Hearing/Vision screen Vision Screening - Comments:: Saw Eye Dr. Maurice Small Year.    Goals Addressed             This Visit's Progress    Quit Smoking       Weaning off of       Depression Screen    07/22/2023   10:06 AM 07/14/2023    9:32 AM 01/10/2023    8:45 AM 04/29/2022    8:48 AM 02/07/2022    8:52 AM 10/22/2021   10:28 AM 02/07/2021   11:02 AM  PHQ 2/9 Scores  PHQ - 2 Score 0 0 0 0 0 0 0    Fall Risk    07/22/2023   10:06 AM 07/22/2023    9:35 AM 07/14/2023    9:32 AM 01/10/2023     8:45 AM 04/29/2022    8:48 AM  Fall Risk   Falls in the past year? 0 1 0 0 0  Number falls in past yr: 0 0 0 0 0  Injury with Fall? 0 0 0 0 0  Risk for fall due to : No Fall Risks  No Fall Risks No Fall Risks No Fall Risks  Follow up Falls evaluation completed  Falls evaluation completed;Education provided;Falls prevention discussed Falls evaluation completed Falls evaluation completed    MEDICARE RISK AT HOME: Medicare Risk at Home Any stairs in or around the home?: (Patient-Rptd) Yes If so, are there any without handrails?: (Patient-Rptd) No Home free of loose throw rugs in walkways, pet beds, electrical cords, etc?: (Patient-Rptd) No Adequate lighting in your home to reduce risk of falls?: (Patient-Rptd) No Life alert?: (Patient-Rptd) Yes Use of a cane, walker or w/c?: (Patient-Rptd) No Grab bars in the bathroom?: (Patient-Rptd) No Shower chair or bench in shower?: (Patient-Rptd) No Elevated toilet seat or a handicapped toilet?: (  Patient-Rptd) No  TIMED UP AND GO:  Was the test performed?  No    Cognitive Function:        07/22/2023   10:07 AM 02/07/2022    8:57 AM 01/30/2021    8:31 AM 09/16/2019   10:25 AM  6CIT Screen  What Year? 0 points 0 points 0 points 0 points  What month? 0 points 0 points 0 points 0 points  What time? 0 points 0 points 0 points 0 points  Count back from 20 0 points 0 points 0 points 0 points  Months in reverse 0 points 0 points 0 points 0 points  Repeat phrase 2 points 0 points 2 points 2 points  Total Score 2 points 0 points 2 points 2 points    Immunizations Immunization History  Administered Date(s) Administered   Influenza-Unspecified 03/07/2023   PFIZER(Purple Top)SARS-COV-2 Vaccination 12/22/2019, 01/30/2020   Pneumococcal Conjugate-13 09/16/2017   Pneumococcal Polysaccharide-23 12/21/2018   RSV,unspecified 03/18/2023   Tdap 05/13/2022    TDAP status: Up to date  Flu Vaccine status: Up to date  Pneumococcal vaccine status: Up  to date  Covid-19 vaccine status: Information provided on how to obtain vaccines.   Qualifies for Shingles Vaccine? Yes   Zostavax completed No   Shingrix Completed?: No.    Education has been provided regarding the importance of this vaccine. Patient has been advised to call insurance company to determine out of pocket expense if they have not yet received this vaccine. Advised may also receive vaccine at local pharmacy or Health Dept. Verbalized acceptance and understanding.  Screening Tests Health Maintenance  Topic Date Due   Zoster Vaccines- Shingrix (1 of 2) Never done   Diabetic kidney evaluation - Urine ACR  04/30/2023   Lung Cancer Screening  07/13/2024 (Originally 04/13/2020)   COVID-19 Vaccine (3 - 2024-25 season) 07/21/2024 (Originally 01/19/2023)   OPHTHALMOLOGY EXAM  11/08/2023   FOOT EXAM  01/10/2024   HEMOGLOBIN A1C  01/11/2024   Diabetic kidney evaluation - eGFR measurement  07/13/2024   Medicare Annual Wellness (AWV)  07/21/2024   Fecal DNA (Cologuard)  02/25/2025   DTaP/Tdap/Td (2 - Td or Tdap) 05/13/2032   Pneumonia Vaccine 26+ Years old  Completed   HPV VACCINES  Aged Out   INFLUENZA VACCINE  Discontinued   Hepatitis C Screening  Discontinued    Health Maintenance  Health Maintenance Due  Topic Date Due   Zoster Vaccines- Shingrix (1 of 2) Never done   Diabetic kidney evaluation - Urine ACR  04/30/2023    Colorectal cancer screening: Type of screening: Cologuard. Completed 2023. Repeat every 3 years  Lung Cancer Screening: (Low Dose CT Chest recommended if Age 49-80 years, 20 pack-year currently smoking OR have quit w/in 15years.) does qualify.   Lung Cancer Screening Referral: pt has declined  Additional Screening:  Hepatitis C Screening: does qualify; Completed   Vision Screening: Recommended annual ophthalmology exams for early detection of glaucoma and other disorders of the eye. Is the patient up to date with their annual eye exam?  Yes  Who is  the provider or what is the name of the office in which the patient attends annual eye exams? [facility]  If pt is not established with a provider, would they like to be referred to a provider to establish care? No .   Dental Screening: Recommended annual dental exams for proper oral hygiene  Diabetic Foot Exam: Diabetic Foot Exam: Completed 12/2022  Community Resource Referral / Chronic Care Management:  CRR required this visit?  No   CCM required this visit?  No     Plan:     I have personally reviewed and noted the following in the patient's chart:   Medical and social history Use of alcohol, tobacco or illicit drugs  Current medications and supplements including opioid prescriptions. Patient is not currently taking opioid prescriptions. Functional ability and status Nutritional status Physical activity Advanced directives List of other physicians Hospitalizations, surgeries, and ER visits in previous 12 months Vitals Screenings to include cognitive, depression, and falls Referrals and appointments  In addition, I have reviewed and discussed with patient certain preventive protocols, quality metrics, and best practice recommendations. A written personalized care plan for preventive services as well as general preventive health recommendations were provided to patient.     Sharon Seller, NP   07/22/2023   After Visit Summary: (MyChart) Due to this being a telephonic visit, the after visit summary with patients personalized plan was offered to patient via MyChart

## 2023-08-11 ENCOUNTER — Other Ambulatory Visit: Payer: Self-pay | Admitting: Nurse Practitioner

## 2023-08-11 DIAGNOSIS — J449 Chronic obstructive pulmonary disease, unspecified: Secondary | ICD-10-CM

## 2023-09-06 ENCOUNTER — Other Ambulatory Visit: Payer: Self-pay | Admitting: Nurse Practitioner

## 2023-09-06 DIAGNOSIS — J209 Acute bronchitis, unspecified: Secondary | ICD-10-CM

## 2023-10-08 ENCOUNTER — Other Ambulatory Visit: Payer: Self-pay | Admitting: Cardiology

## 2023-10-15 ENCOUNTER — Ambulatory Visit: Admitting: Pulmonary Disease

## 2023-10-21 ENCOUNTER — Other Ambulatory Visit: Payer: Self-pay | Admitting: Nurse Practitioner

## 2023-10-21 DIAGNOSIS — J209 Acute bronchitis, unspecified: Secondary | ICD-10-CM

## 2023-11-13 ENCOUNTER — Other Ambulatory Visit: Payer: Self-pay

## 2023-11-13 MED ORDER — SPIRONOLACTONE 25 MG PO TABS: 25.0000 mg | ORAL_TABLET | Freq: Every day | ORAL | 0 refills | Status: DC

## 2024-01-09 DIAGNOSIS — H524 Presbyopia: Secondary | ICD-10-CM | POA: Diagnosis not present

## 2024-01-09 DIAGNOSIS — E119 Type 2 diabetes mellitus without complications: Secondary | ICD-10-CM | POA: Diagnosis not present

## 2024-01-09 DIAGNOSIS — H35013 Changes in retinal vascular appearance, bilateral: Secondary | ICD-10-CM | POA: Diagnosis not present

## 2024-01-09 DIAGNOSIS — H04123 Dry eye syndrome of bilateral lacrimal glands: Secondary | ICD-10-CM | POA: Diagnosis not present

## 2024-01-09 DIAGNOSIS — H25813 Combined forms of age-related cataract, bilateral: Secondary | ICD-10-CM | POA: Diagnosis not present

## 2024-01-09 LAB — HM DIABETES EYE EXAM

## 2024-01-12 ENCOUNTER — Ambulatory Visit: Payer: Medicare Other | Admitting: Nurse Practitioner

## 2024-01-23 NOTE — Patient Instructions (Addendum)
 1.) Visit your local pharmacy if you would like to receive  the shingles vaccine series

## 2024-01-26 ENCOUNTER — Encounter: Payer: Self-pay | Admitting: Nurse Practitioner

## 2024-01-26 ENCOUNTER — Ambulatory Visit (INDEPENDENT_AMBULATORY_CARE_PROVIDER_SITE_OTHER): Admitting: Nurse Practitioner

## 2024-01-26 VITALS — BP 126/82 | HR 88 | Temp 97.1°F | Ht 71.0 in | Wt 239.0 lb

## 2024-01-26 DIAGNOSIS — I10 Essential (primary) hypertension: Secondary | ICD-10-CM | POA: Diagnosis not present

## 2024-01-26 DIAGNOSIS — E1165 Type 2 diabetes mellitus with hyperglycemia: Secondary | ICD-10-CM | POA: Diagnosis not present

## 2024-01-26 DIAGNOSIS — E782 Mixed hyperlipidemia: Secondary | ICD-10-CM | POA: Diagnosis not present

## 2024-01-26 DIAGNOSIS — M549 Dorsalgia, unspecified: Secondary | ICD-10-CM

## 2024-01-26 DIAGNOSIS — I5022 Chronic systolic (congestive) heart failure: Secondary | ICD-10-CM | POA: Diagnosis not present

## 2024-01-26 DIAGNOSIS — J449 Chronic obstructive pulmonary disease, unspecified: Secondary | ICD-10-CM | POA: Diagnosis not present

## 2024-01-26 DIAGNOSIS — Z720 Tobacco use: Secondary | ICD-10-CM | POA: Diagnosis not present

## 2024-01-26 MED ORDER — UMECLIDINIUM-VILANTEROL 62.5-25 MCG/ACT IN AEPB: INHALATION_SPRAY | RESPIRATORY_TRACT | 5 refills | Status: AC

## 2024-01-26 NOTE — Progress Notes (Unsigned)
 Careteam: Patient Care Team: Caro Harlene POUR, NP as PCP - General (Geriatric Medicine) Shlomo Wilbert SAUNDERS, MD as PCP - Cardiology (Cardiology) Roark Rush, MD as Consulting Physician (Otolaryngology) Nieves Cough, MD as Consulting Physician (Urology) Octavia Bruckner, MD as Consulting Physician (Ophthalmology)  PLACE OF SERVICE:  [facility]  Advanced Directive information    Allergies  Allergen Reactions   Chantix [Varenicline Tartrate]     Aggression     Chief Complaint  Patient presents with   Medical Management of Chronic Issues    6 month follow-up. Foot exam, urine microalbumin, and A1c today. Patient declined shingrix and covid (declined), however aware can get at pharmacy if he would like to reconsider in the future. Patient is unaware of all medications, wife manages.    Cough    Patient with continued hacking cough since RSV vaccine last year. On prednisone  taper pack    HPI:  Discussed the use of AI scribe software for clinical note transcription with the patient, who gave verbal consent to proceed.  History of Present Illness Timothy Lee is a 73 year old male with COPD who presents for a routine follow-up.  He has a history of COPD and continues to experience a chronic cough, which began after receiving an RSV shot in October. He is currently smoking five to six cigarettes a day, down from two packs, and is attempting to quit. He uses Anoro inhaler, one puff daily, but ran out this morning. He is also on prednisone .  He has a history of aspiration, which he attributes to a previous surgery where his soft palate was cut. He describes frequent episodes of aspiration, sometimes requiring self-administered Heimlich maneuvers. He has not seen a speech therapist for this issue.  He has a history of heart failure and is under the care of Dr. Shlomo. He is on multiple medications including Entresto , Farxiga , Lasix  20 mg daily, metoprolol  125 mg daily,  and spironolactone . No chest pain or palpitations but mentions occasional gas pain.  His back went out a couple of weeks ago due to 'stupidity' but is currently doing okay.  He is on Lipitor  80 mg daily for cholesterol, which was checked in February and was reported as good. He has not been regularly checking his blood sugars but recalls a reading of 146 mg/dL three weeks ago after breakfast.  He mentions that he has not undergone lung cancer screening since his COPD diagnosis, although he had a CT scan of his heart at that time.  ***  Review of Systems:  ROS***  Past Medical History:  Diagnosis Date   Arthritis    Bladder cancer [facility]) urologist-  dr cyndra   Borderline diabetes    Bursitis of both shoulders    CAD (coronary artery disease), native coronary artery    Cath 03/2019 with 20% prox and 60% mid LAD stenosis >>medical management recommended   Chronic systolic CHF (congestive heart failure), NYHA class 2 (HCC)    EF 29% by cMRI>>improved to 50-55% by echo 02/2021   Diabetes mellitus without complication (HCC)    Disorder of male genital organs, unspecified    Dyspnea    Esophagus disorder    Constricted Esophagus, per New Patient Packet-PSC    History of basal cell carcinoma (BCC) excision    05/ 2018  right forehead   HLD (hyperlipidemia)    Hypertension    Mild acid reflux no meds   Nocturia    OSA (obstructive sleep apnea)  per pt has not used cpap in years   Renal cyst, left 11/2011   16 mm   Sleep apnea    Per New Patient Packet-PSC    Smokers' cough (HCC)    SOB (shortness of breath) on exertion    Past Surgical History:  Procedure Laterality Date   CYSTOSCOPY WITH BIOPSY  03/13/2012   Procedure: CYSTOSCOPY WITH BIOPSY;  Surgeon: Donnice Gwenyth Brooks, MD;  [facility];  Service: Urology;  Laterality: N/A;      ESOPHAGEAL DILATION  05/20/1986   Esophagus Stretch, per New Patient Packet-PSC    RIGHT/LEFT HEART CATH AND  CORONARY ANGIOGRAPHY N/A 04/16/2019   Procedure: RIGHT/LEFT HEART CATH AND CORONARY ANGIOGRAPHY;  Surgeon: Burnard Debby LABOR, MD;  [facility];  Service: Cardiovascular;  Laterality: N/A;   SHOULDER ARTHROSCOPY WITH ROTATOR CUFF REPAIR Left 03/15/2021   Procedure: Left shoulder arthroscopy, debridement, subacromial decompression, distal clavicle resection, possible rotator cuff repair;  Surgeon: Melita Drivers, MD;  Location: [facility];  Service: Orthopedics;  Laterality: Left;   TONSILLECTOMY  age 39   TRANSURETHRAL RESECTION OF BLADDER TUMOR N/A 05/23/2017   Procedure: TRANSURETHRAL RESECTION OF BLADDER TUMOR / (TURBT) WITH INSTILLATION OF EPIRUBICIN ;  Surgeon: Brooks Donnice, MD;  Location: [facility];  Service: Urology;  Laterality: N/A;   UVULECTOMY  05/20/1990   Dr.Byers, per new patient packet    UVULOPALATOPHARYNGOPLASTY  2005   osa   Social History:   reports that he has been smoking cigarettes. He has a 11.8 pack-year smoking history. He quit smokeless tobacco use about 55 years ago.  His smokeless tobacco use included chew. He reports current alcohol  use. He reports that he does not currently use drugs.  Family History  Problem Relation Age of Onset   Diabetes Mother    Dementia Mother    Stroke Mother    Arthritis Mother    Hypertension Mother    Multiple sclerosis Father    Diabetes Father    Diabetes Sister    Hypertension Sister    ADD / ADHD Son    Migraines Daughter    ADD / ADHD Daughter    Cancer Maternal Grandmother     Medications: Patient's Medications  New Prescriptions   No medications on file  Previous Medications   ACCU-CHEK GUIDE TEST STRIP    USE TO TEST BLOOD SUGAR DAILY.   ACCU-CHEK SOFTCLIX LANCETS LANCETS    USE TO TEST BLOOD SUGAR DAILY.   ACETAMINOPHEN  (TYLENOL ) 500 MG TABLET    Take 2 tablets (1,000 mg total) by mouth every 8 (eight) hours as needed for moderate pain.   ALBUTEROL  (VENTOLIN  HFA) 108 (90 BASE)  MCG/ACT INHALER    INHALE 2 PUFFS INTO THE LUNGS EVERY 6 HOURS AS NEEDED FOR WHEEZING OR SHORTNESS OF BREATH.   ANORO ELLIPTA  62.5-25 MCG/ACT AEPB    INHALE 1 PUFF INTO THE LUNGS ONCE DAILY.   ASPIRIN  81 MG CHEWABLE TABLET    Chew 1 tablet (81 mg total) by mouth daily.   ATORVASTATIN  (LIPITOR ) 80 MG TABLET    Please advise patient to schedule office visit by the end of August to avoid delay in future refills, thank you   CARISOPRODOL  (SOMA ) 350 MG TABLET    Take 1 tablet (350 mg total) by mouth at bedtime as needed for muscle spasms.   DULAGLUTIDE  (TRULICITY ) 0.75 MG/0.5ML SOPN    INJECT 1 SYRINGE (50 MG) SUBCUTANEOUSLY ONCE A WEEK (ON THE SAME DAY  OF EACH WEEK) AS DIRECTED.   ENTRESTO  97-103 MG    TAKE (1) TABLET BY MOUTH TWICE DAILY.   FARXIGA  10 MG TABS TABLET    Take 1 tablet (10 mg total) by mouth daily before breakfast. Please keep scheduled appointment for future refills. Thank you.   FUROSEMIDE  (LASIX ) 20 MG TABLET    TAKE 1 TABLET BY MOUTH DAILY.   GLIPIZIDE  (GLUCOTROL ) 5 MG TABLET    TAKE (1) TABLET BY MOUTH TWICE DAILY.   GLUCOSAMINE-CHONDROITIN-MSM (GLUCOS-CHOND-MSM-DOUBLE STR PO)    Take 2 tablets by mouth daily.   LEVOCETIRIZINE (XYZAL) 5 MG TABLET    Take 5 mg by mouth daily as needed for allergies.   METFORMIN  (GLUCOPHAGE ) 1000 MG TABLET    TAKE (1) TABLET BY MOUTH TWICE A DAY WITH MEALS (BREAKFAST AND SUPPER).   METOPROLOL  SUCCINATE (TOPROL -XL) 100 MG 24 HR TABLET    TAKE (1) TABLET BY MOUTH ONCE DAILY. MD REQUESTS APPOINTMENT.   METOPROLOL  SUCCINATE (TOPROL -XL) 25 MG 24 HR TABLET    TAKE (1) TABLET BY MOUTH ONCE DAILY.   MULTIPLE VITAMIN (MULTIVITAMIN WITH MINERALS) TABS TABLET    Take 1 tablet by mouth daily.   POTASSIUM CHLORIDE  SA (KLOR-CON  M) 20 MEQ TABLET    TAKE (2) TABLETS BY MOUTH ONCE DAILY.   PREDNISONE  PO    Take by mouth as directed. Taper Pack   SPIRONOLACTONE  (ALDACTONE ) 25 MG TABLET    Take 1 tablet (25 mg total) by mouth daily.  Modified Medications   No  medications on file  Discontinued Medications   No medications on file    Physical Exam:  Vitals:   01/23/24 1443  BP: 126/82  Pulse: 88  Temp: (!) 97.1 F (36.2 C)  SpO2: 92%  Weight: 239 lb (108.4 kg)  Height: 5' 11 (1.803 m)   Body mass index is 33.33 kg/m. Wt Readings from Last 3 Encounters:  01/23/24 239 lb (108.4 kg)  07/14/23 235 lb 9.6 oz (106.9 kg)  01/10/23 238 lb (108 kg)    Physical Exam***  Labs reviewed: Basic Metabolic Panel: Recent Labs    07/14/23 1042  NA 140  K 4.6  CL 101  CO2 29  GLUCOSE 100*  BUN 12  CREATININE 1.14  CALCIUM  10.1   Liver Function Tests: Recent Labs    07/14/23 1042  AST 15  ALT 22  BILITOT 1.6*  PROT 6.5   No results for input(s): LIPASE, AMYLASE in the last 8760 hours. No results for input(s): AMMONIA in the last 8760 hours. CBC: Recent Labs    07/14/23 1042  WBC 10.4  NEUTROABS 7,290  HGB 16.2  HCT 48.2  MCV 91.6  PLT 318   Lipid Panel: Recent Labs    07/14/23 1042  CHOL 80  HDL 40  LDLCALC 24  TRIG 79  CHOLHDL 2.0   TSH: No results for input(s): TSH in the last 8760 hours. A1C: Lab Results  Component Value Date   HGBA1C 6.7 (H) 07/14/2023     Assessment/Plan *** Type 2 diabetes mellitus with hyperglycemia, without long-term current use of insulin  (HCC)     No follow-ups on file.: ***  [place] K. Caro BODILY [facility] [phone]

## 2024-01-27 ENCOUNTER — Ambulatory Visit: Payer: Self-pay | Admitting: Nurse Practitioner

## 2024-01-27 LAB — COMPREHENSIVE METABOLIC PANEL WITH GFR
AG Ratio: 2.2 (calc) (ref 1.0–2.5)
ALT: 21 U/L (ref 9–46)
AST: 14 U/L (ref 10–35)
Albumin: 4.6 g/dL (ref 3.6–5.1)
Alkaline phosphatase (APISO): 47 U/L (ref 35–144)
BUN: 19 mg/dL (ref 7–25)
CO2: 27 mmol/L (ref 20–32)
Calcium: 10 mg/dL (ref 8.6–10.3)
Chloride: 99 mmol/L (ref 98–110)
Creat: 1.07 mg/dL (ref 0.70–1.28)
Globulin: 2.1 g/dL (ref 1.9–3.7)
Glucose, Bld: 161 mg/dL — ABNORMAL HIGH (ref 65–99)
Potassium: 4.8 mmol/L (ref 3.5–5.3)
Sodium: 138 mmol/L (ref 135–146)
Total Bilirubin: 1.6 mg/dL — ABNORMAL HIGH (ref 0.2–1.2)
Total Protein: 6.7 g/dL (ref 6.1–8.1)
eGFR: 74 mL/min/1.73m2 (ref >=60)

## 2024-01-27 LAB — HEMOGLOBIN A1C
Hgb A1c MFr Bld: 6.9 % — ABNORMAL HIGH (ref <5.7)
Mean Plasma Glucose: 151 mg/dL
eAG (mmol/L): 8.4 mmol/L

## 2024-01-27 LAB — CBC WITH DIFFERENTIAL/PLATELET
Absolute Lymphocytes: 1033 {cells}/uL (ref 850–3900)
Absolute Monocytes: 467 {cells}/uL (ref 200–950)
Basophils Absolute: 25 {cells}/uL (ref 0–200)
Basophils Relative: 0.2 %
Eosinophils Absolute: 25 {cells}/uL (ref 15–500)
Eosinophils Relative: 0.2 %
HCT: 50.7 % — ABNORMAL HIGH (ref 38.5–50.0)
Hemoglobin: 17 g/dL (ref 13.2–17.1)
MCH: 31.3 pg (ref 27.0–33.0)
MCHC: 33.5 g/dL (ref 32.0–36.0)
MCV: 93.4 fL (ref 80.0–100.0)
MPV: 9.9 fL (ref 7.5–12.5)
Monocytes Relative: 3.8 %
Neutro Abs: 10750 {cells}/uL — ABNORMAL HIGH (ref 1500–7800)
Neutrophils Relative %: 87.4 %
Platelets: 294 Thousand/uL (ref 140–400)
RBC: 5.43 Million/uL (ref 4.20–5.80)
RDW: 12.1 % (ref 11.0–15.0)
Total Lymphocyte: 8.4 %
WBC: 12.3 Thousand/uL — ABNORMAL HIGH (ref 3.8–10.8)

## 2024-01-27 LAB — MICROALBUMIN / CREATININE URINE RATIO
Creatinine, Urine: 30 mg/dL (ref 20–320)
Microalb Creat Ratio: 20 mg/g{creat} (ref <30)
Microalb, Ur: 0.6 mg/dL

## 2024-02-24 NOTE — Progress Notes (Unsigned)
 New Patient Pulmonology Office Visit   Subjective:  Patient ID: Timothy Lee, male    DOB: 1951/05/12  MRN: [id number]  Referred by: Caro Harlene POUR, NP  CC: No chief complaint on file.   HPI Timothy Lee is a 73 y.o. male with DM, HTN, HLD and presumed COPD who presents to establish care.    {PULM QUESTIONNAIRES (Optional):33196}  ROS  Allergies: Chantix [varenicline tartrate]  Current Outpatient Medications:    ACCU-CHEK GUIDE test strip, USE TO TEST BLOOD SUGAR DAILY., Disp: 100 strip, Rfl: 0   Accu-Chek Softclix Lancets lancets, USE TO TEST BLOOD SUGAR DAILY., Disp: 100 each, Rfl: 3   acetaminophen  (TYLENOL ) 500 MG tablet, Take 2 tablets (1,000 mg total) by mouth every 8 (eight) hours as needed for moderate pain., Disp: 30 tablet, Rfl: 0   albuterol  (VENTOLIN  HFA) 108 (90 Base) MCG/ACT inhaler, INHALE 2 PUFFS INTO THE LUNGS EVERY 6 HOURS AS NEEDED FOR WHEEZING OR SHORTNESS OF BREATH., Disp: 8.5 g, Rfl: 5   aspirin  81 MG chewable tablet, Chew 1 tablet (81 mg total) by mouth daily., Disp: 90 tablet, Rfl: 0   atorvastatin  (LIPITOR ) 80 MG tablet, Please advise patient to schedule office visit by the end of August to avoid delay in future refills, thank you, Disp: 100 tablet, Rfl: 1   carisoprodol  (SOMA ) 350 MG tablet, Take 1 tablet (350 mg total) by mouth at bedtime as needed for muscle spasms., Disp: 30 tablet, Rfl: 0   Dulaglutide  (TRULICITY ) 0.75 MG/0.5ML SOPN, INJECT 1 SYRINGE (50 MG) SUBCUTANEOUSLY ONCE A WEEK (ON THE SAME DAY OF EACH WEEK) AS DIRECTED., Disp: 6 mL, Rfl: 3   ENTRESTO  97-103 MG, TAKE (1) TABLET BY MOUTH TWICE DAILY., Disp: 180 tablet, Rfl: 3   FARXIGA  10 MG TABS tablet, Take 1 tablet (10 mg total) by mouth daily before breakfast. Please keep scheduled appointment for future refills. Thank you., Disp: 90 tablet, Rfl: 3   furosemide  (LASIX ) 20 MG tablet, TAKE 1 TABLET BY MOUTH DAILY., Disp: 90 tablet, Rfl: 3   glipiZIDE  (GLUCOTROL ) 5 MG tablet, TAKE  (1) TABLET BY MOUTH TWICE DAILY., Disp: 180 tablet, Rfl: 1   Glucosamine-Chondroitin-MSM (GLUCOS-CHOND-MSM-DOUBLE STR PO), Take 2 tablets by mouth daily., Disp: , Rfl:    levocetirizine (XYZAL) 5 MG tablet, Take 5 mg by mouth daily as needed for allergies., Disp: , Rfl:    metFORMIN  (GLUCOPHAGE ) 1000 MG tablet, TAKE (1) TABLET BY MOUTH TWICE A DAY WITH MEALS (BREAKFAST AND SUPPER)., Disp: 60 tablet, Rfl: 11   metoprolol  succinate (TOPROL -XL) 100 MG 24 hr tablet, TAKE (1) TABLET BY MOUTH ONCE DAILY. MD REQUESTS APPOINTMENT., Disp: 90 tablet, Rfl: 2   metoprolol  succinate (TOPROL -XL) 25 MG 24 hr tablet, TAKE (1) TABLET BY MOUTH ONCE DAILY., Disp: 90 tablet, Rfl: 3   Multiple Vitamin (MULTIVITAMIN WITH MINERALS) TABS tablet, Take 1 tablet by mouth daily., Disp: 90 tablet, Rfl: 1   potassium chloride  SA (KLOR-CON  M) 20 MEQ tablet, TAKE (2) TABLETS BY MOUTH ONCE DAILY., Disp: 180 tablet, Rfl: 2   PREDNISONE  PO, Take by mouth as directed. Taper Pack, Disp: , Rfl:    spironolactone  (ALDACTONE ) 25 MG tablet, Take 1 tablet (25 mg total) by mouth daily., Disp: 90 tablet, Rfl: 0   umeclidinium-vilanterol (ANORO ELLIPTA ) 62.5-25 MCG/ACT AEPB, INHALE 1 PUFF INTO THE LUNGS ONCE DAILY., Disp: 60 each, Rfl: 5 Past Medical History:  Diagnosis Date   Arthritis    Bladder cancer [facility]) urologist-  dr cyndra  Borderline diabetes    Bursitis of both shoulders    CAD (coronary artery disease), native coronary artery    Cath 03/2019 with 20% prox and 60% mid LAD stenosis >>medical management recommended   Chronic systolic CHF (congestive heart failure), NYHA class 2 (HCC)    EF 29% by cMRI>>improved to 50-55% by echo 02/2021   Diabetes mellitus without complication (HCC)    Disorder of male genital organs, unspecified    Dyspnea    Esophagus disorder    Constricted Esophagus, per New Patient Packet-PSC    History of basal cell carcinoma (BCC) excision    05/ 2018  right forehead   HLD (hyperlipidemia)     Hypertension    Mild acid reflux no meds   Nocturia    OSA (obstructive sleep apnea)    per pt has not used cpap in years   Renal cyst, left 11/2011   16 mm   Sleep apnea    Per New Patient Packet-PSC    Smokers' cough (HCC)    SOB (shortness of breath) on exertion    Past Surgical History:  Procedure Laterality Date   CYSTOSCOPY WITH BIOPSY  03/13/2012   Procedure: CYSTOSCOPY WITH BIOPSY;  Surgeon: Donnice Gwenyth Brooks, MD;  Location: [facility];  Service: Urology;  Laterality: N/A;      ESOPHAGEAL DILATION  05/20/1986   Esophagus Stretch, per New Patient Packet-PSC    RIGHT/LEFT HEART CATH AND CORONARY ANGIOGRAPHY N/A 04/16/2019   Procedure: RIGHT/LEFT HEART CATH AND CORONARY ANGIOGRAPHY;  Surgeon: Burnard Debby LABOR, MD;  Location: [facility];  Service: Cardiovascular;  Laterality: N/A;   SHOULDER ARTHROSCOPY WITH ROTATOR CUFF REPAIR Left 03/15/2021   Procedure: Left shoulder arthroscopy, debridement, subacromial decompression, distal clavicle resection, possible rotator cuff repair;  Surgeon: Melita Drivers, MD;  Location: [facility];  Service: Orthopedics;  Laterality: Left;   TONSILLECTOMY  age 89   TRANSURETHRAL RESECTION OF BLADDER TUMOR N/A 05/23/2017   Procedure: TRANSURETHRAL RESECTION OF BLADDER TUMOR / (TURBT) WITH INSTILLATION OF EPIRUBICIN ;  Surgeon: Brooks Donnice, MD;  Location: [facility];  Service: Urology;  Laterality: N/A;   UVULECTOMY  05/20/1990   Dr.Byers, per new patient packet    UVULOPALATOPHARYNGOPLASTY  2005   osa   Family History  Problem Relation Age of Onset   Diabetes Mother    Dementia Mother    Stroke Mother    Arthritis Mother    Hypertension Mother    Multiple sclerosis Father    Diabetes Father    Diabetes Sister    Hypertension Sister    ADD / ADHD Son    Migraines Daughter    ADD / ADHD Daughter    Cancer Maternal Grandmother    Social History   Socioeconomic History   Marital status: Married     Spouse name: Not on file   Number of children: Not on file   Years of education: Not on file   Highest education level: Some college, no degree  Occupational History   Not on file  Tobacco Use   Smoking status: Every Day    Current packs/day: 0.25    Average packs/day: 0.3 packs/day for 47.0 years (11.8 ttl pk-yrs)    Types: Cigarettes   Smokeless tobacco: Former    Types: Chew    Quit date: 1970   Tobacco comments:    Quit for a while and started back a couple of x 30 days as of 01/26/24  Vaping Use  Vaping status: Former  Substance and Sexual Activity   Alcohol  use: Yes    Comment: very seldom- rare   Drug use: Not Currently   Sexual activity: Not Currently  Other Topics Concern   Not on file  Social History Narrative   Diet: None      Caffeine: Yes      Married, if yes what year:  Yes, 1990      Do you live in a house, apartment, assisted living, condo, trailer, ect: House, 3 stories, 4 persons      Pets: 2 dogs, 2 cats       Highest level of education: Some Engineer, mining profession: Architect       Exercise: Some         Living Will: Yes   DNR: Yes   POA/HPOA: Yes      Functional Status:   Do you have difficulty bathing or dressing yourself? No   Do you have difficulty preparing food or eating? No   Do you have difficulty managing your medications? No   Do you have difficulty managing your finances? No   Do you have difficulty affording your medications? No   Social Drivers of Corporate investment banker Strain: Low Risk  (07/22/2023)   Overall Financial Resource Strain (CARDIA)    Difficulty of Paying Living Expenses: Not hard at all  Food Insecurity: No Food Insecurity (07/22/2023)   Hunger Vital Sign    Worried About Running Out of Food in the Last Year: Never true    Ran Out of Food in the Last Year: Never true  Transportation Needs: No Transportation Needs (07/22/2023)   PRAPARE - Administrator, Civil Service (Medical):  No    Lack of Transportation (Non-Medical): No  Physical Activity: Insufficiently Active (07/22/2023)   Exercise Vital Sign    Days of Exercise per Week: 3 days    Minutes of Exercise per Session: 20 min  Stress: No Stress Concern Present (07/22/2023)   [employer] of Occupational Health - Occupational Stress Questionnaire    Feeling of Stress : Not at all  Social Connections: Moderately Integrated (07/22/2023)   Social Connection and Isolation Panel    Frequency of Communication with Friends and Family: Three times a week    Frequency of Social Gatherings with Friends and Family: More than three times a week    Attends Religious Services: 1 to 4 times per year    Active Member of [employer] or Organizations: No    Attends Banker Meetings: Never    Marital Status: Married  Recent Concern: Social Connections - Moderately Isolated (07/22/2023)   Social Connection and Isolation Panel    Frequency of Communication with Friends and Family: Once a week    Frequency of Social Gatherings with Friends and Family: Once a week    Attends Religious Services: More than 4 times per year    Active Member of [employer] or Organizations: No    Attends Banker Meetings: Not on file    Marital Status: Married  Catering manager Violence: Not At Risk (07/22/2023)   Humiliation, Afraid, Rape, and Kick questionnaire    Fear of Current or Ex-Partner: No    Emotionally Abused: No    Physically Abused: No    Sexually Abused: No       Objective:  There were no vitals taken for this visit. {Pulm Vitals (Optional):32837}  Physical Exam  Diagnostic Review:  {  Labs (Optional):32838}  CT Chest 03/2019: IMPRESSION: 1. No evidence of pulmonary embolism. 2. Moderate bilateral pleural effusions with adjacent passive atelectasis in the airless lung. 3. Interlobular septal thickening in the lung bases and apices with some central peribronchovascular and ground-glass opacity, most suggestive  of pulmonary edema. 4. Low-attenuation mediastinal and hilar nodes, likely reactive/edematous. 5. Heterogeneous, enlarged left lobe thyroid gland. Discrete nodularity difficult to ascertain. Consider further evaluation with thyroid ultrasound. This follows consensus guidelines: Managing Incidental Thyroid Nodules Detected on Imaging: White Paper of the ACR Incidental Thyroid Findings Committee. J Am Coll Radiol 2015; 12:143-150. and [facility] 3-tiered system for managing ITNs: J Am Coll Radiol. 2015; Feb;12(2): 143-50 6. Aortic Atherosclerosis (ICD10-I70.0). 7. Cardiomegaly.  Coronary atherosclerosis.  Echo 02/2021: IMPRESSIONS   1. Left ventricular ejection fraction, by estimation, is 50 to 55%. The  left ventricle has low normal function. The left ventricle has no regional  wall motion abnormalities. Left ventricular diastolic parameters were  normal.   2. Right ventricular systolic function is normal. The right ventricular  size is normal. Tricuspid regurgitation signal is inadequate for assessing  PA pressure.   3. The mitral valve is grossly normal. Trivial mitral valve  regurgitation. No evidence of mitral stenosis.   4. The aortic valve is tricuspid. There is mild calcification of the  aortic valve. Aortic valve regurgitation is not visualized. No aortic  stenosis is present.   5. The inferior vena cava is normal in size with greater than 50%  respiratory variability, suggesting right atrial pressure of 3 mmHg.     Assessment & Plan:   Assessment & Plan   No orders of the defined types were placed in this encounter.     No follow-ups on file.   Quint Chestnut, MD

## 2024-02-25 ENCOUNTER — Ambulatory Visit (HOSPITAL_BASED_OUTPATIENT_CLINIC_OR_DEPARTMENT_OTHER): Admitting: Pulmonary Disease

## 2024-02-25 ENCOUNTER — Encounter (HOSPITAL_BASED_OUTPATIENT_CLINIC_OR_DEPARTMENT_OTHER): Payer: Self-pay | Admitting: Pulmonary Disease

## 2024-02-25 VITALS — BP 137/79 | HR 84 | Ht 71.0 in | Wt 236.8 lb

## 2024-02-25 DIAGNOSIS — J449 Chronic obstructive pulmonary disease, unspecified: Secondary | ICD-10-CM

## 2024-02-25 DIAGNOSIS — F17200 Nicotine dependence, unspecified, uncomplicated: Secondary | ICD-10-CM

## 2024-02-25 DIAGNOSIS — J309 Allergic rhinitis, unspecified: Secondary | ICD-10-CM

## 2024-02-25 DIAGNOSIS — F17210 Nicotine dependence, cigarettes, uncomplicated: Secondary | ICD-10-CM | POA: Diagnosis not present

## 2024-02-25 DIAGNOSIS — Z122 Encounter for screening for malignant neoplasm of respiratory organs: Secondary | ICD-10-CM

## 2024-02-25 DIAGNOSIS — T17908A Unspecified foreign body in respiratory tract, part unspecified causing other injury, initial encounter: Secondary | ICD-10-CM

## 2024-02-25 MED ORDER — IPRATROPIUM-ALBUTEROL 0.5-2.5 (3) MG/3ML IN SOLN: 3.0000 mL | RESPIRATORY_TRACT | 6 refills | Status: AC | PRN

## 2024-02-25 MED ORDER — NICOTINE 7 MG/24HR TD PT24: MEDICATED_PATCH | TRANSDERMAL | 0 refills | Status: AC

## 2024-02-25 MED ORDER — NICOTINE 14 MG/24HR TD PT24: MEDICATED_PATCH | TRANSDERMAL | 0 refills | Status: AC

## 2024-02-25 MED ORDER — NICOTINE POLACRILEX 2 MG MT LOZG: LOZENGE | OROMUCOSAL | 0 refills | Status: AC

## 2024-02-25 NOTE — Patient Instructions (Signed)
  VISIT SUMMARY: During your visit, we discussed your chronic cough and its possible causes, including COPD, tobacco use, and aspiration events. We also reviewed your current medications and smoking habits.  YOUR PLAN: CHRONIC OBSTRUCTIVE PULMONARY DISEASE (COPD) WITH CHRONIC COUGH AND INTERMITTENT SPUTUM PRODUCTION: Your COPD is contributing to your chronic cough and occasional wheezing. -We will order a breathing test to confirm your COPD diagnosis. -You will be prescribed a nebulizer with albuterol  solution for use as needed. -Continue using your Anoro and albuterol  inhalers. -We discussed the potential benefits of lung cancer screening with an annual CT scan. -It is important to work on quitting smoking to help manage your COPD.  TOBACCO USE DISORDER, CURRENT: You have a long history of smoking, which is affecting your health. -You will be prescribed nicotine  patches to help you quit smoking. -Consider using nicotine  lozenges or gum to manage cravings. -We provided information on the 1-800-QUIT-NOW hotline for additional support.  ASPIRATION EVENTS WITH POSSIBLE DYSPHAGIA: You have reported difficulty swallowing and occasional aspiration, which may be contributing to your cough. -We will order a swallow test to evaluate for swallowing difficulties and aspiration risk. -If the swallow test shows significant issues, we may refer you to speech therapy.  ALLERGIC RHINITIS WITH POSTNASAL DRIP: Your allergies and postnasal drip are contributing to your cough. -Continue taking cetirizine for your allergies. -We recommend using nasal saline rinses to help manage your postnasal drip.                      Contains text generated by [employer]bridge.                                 Contains text generated by Abridge.

## 2024-03-05 ENCOUNTER — Other Ambulatory Visit: Payer: Self-pay | Admitting: Cardiology

## 2024-03-05 ENCOUNTER — Other Ambulatory Visit: Payer: Self-pay | Admitting: Nurse Practitioner

## 2024-03-08 ENCOUNTER — Other Ambulatory Visit: Payer: Self-pay | Admitting: *Deleted

## 2024-03-08 DIAGNOSIS — E1165 Type 2 diabetes mellitus with hyperglycemia: Secondary | ICD-10-CM

## 2024-03-08 DIAGNOSIS — I5022 Chronic systolic (congestive) heart failure: Secondary | ICD-10-CM

## 2024-03-08 MED ORDER — TRULICITY 0.75 MG/0.5ML ~~LOC~~ SOAJ: SUBCUTANEOUS | 3 refills | Status: AC

## 2024-03-08 NOTE — Telephone Encounter (Signed)
 [facility] pharmacy requested refill.

## 2024-03-10 ENCOUNTER — Encounter: Payer: Self-pay | Admitting: Emergency Medicine

## 2024-03-25 ENCOUNTER — Other Ambulatory Visit: Payer: Self-pay | Admitting: Nurse Practitioner

## 2024-03-25 DIAGNOSIS — J209 Acute bronchitis, unspecified: Secondary | ICD-10-CM

## 2024-04-06 ENCOUNTER — Other Ambulatory Visit: Payer: Self-pay | Admitting: Cardiology

## 2024-04-08 MED ORDER — SPIRONOLACTONE 25 MG PO TABS: 25.0000 mg | ORAL_TABLET | Freq: Every day | ORAL | 0 refills | Status: DC

## 2024-04-26 ENCOUNTER — Other Ambulatory Visit: Payer: Self-pay | Admitting: Cardiology

## 2024-04-29 MED ORDER — METOPROLOL SUCCINATE ER 25 MG PO TB24: 25.0000 mg | ORAL_TABLET | Freq: Every day | ORAL | 0 refills | Status: DC

## 2024-05-02 ENCOUNTER — Other Ambulatory Visit: Payer: Self-pay | Admitting: Cardiology

## 2024-05-06 ENCOUNTER — Other Ambulatory Visit: Payer: Self-pay | Admitting: Cardiology

## 2024-05-19 ENCOUNTER — Other Ambulatory Visit: Payer: Self-pay

## 2024-05-19 MED ORDER — METOPROLOL SUCCINATE ER 100 MG PO TB24: 100.0000 mg | ORAL_TABLET | Freq: Every day | ORAL | 0 refills | Status: AC

## 2024-05-26 ENCOUNTER — Other Ambulatory Visit: Payer: Self-pay | Admitting: Cardiology

## 2024-05-27 MED ORDER — METOPROLOL SUCCINATE ER 25 MG PO TB24: 25.0000 mg | ORAL_TABLET | Freq: Every day | ORAL | 0 refills | Status: DC

## 2024-05-27 MED ORDER — SPIRONOLACTONE 25 MG PO TABS: 25.0000 mg | ORAL_TABLET | Freq: Every day | ORAL | 0 refills | Status: AC

## 2024-05-27 MED ORDER — METOPROLOL SUCCINATE ER 25 MG PO TB24: 25.0000 mg | ORAL_TABLET | Freq: Every day | ORAL | 0 refills | Status: AC

## 2024-05-27 NOTE — Addendum Note (Signed)
 Addended by: BLUFORD, Briseida Gittings L on: 05/27/2024 09:31 AM   Modules accepted: Orders

## 2024-06-18 ENCOUNTER — Other Ambulatory Visit: Payer: Self-pay | Admitting: Cardiology

## 2024-06-18 DIAGNOSIS — I420 Dilated cardiomyopathy: Secondary | ICD-10-CM

## 2024-06-18 MED ORDER — POTASSIUM CHLORIDE CRYS ER 20 MEQ PO TBCR: 20.0000 meq | EXTENDED_RELEASE_TABLET | Freq: Every day | ORAL | 0 refills | Status: AC

## 2024-07-26 ENCOUNTER — Ambulatory Visit: Payer: Self-pay | Admitting: Nurse Practitioner

## 2024-07-27 ENCOUNTER — Encounter: Payer: Self-pay | Admitting: Nurse Practitioner

## 2024-07-29 ENCOUNTER — Ambulatory Visit: Admitting: Nurse Practitioner

## 2024-08-11 ENCOUNTER — Ambulatory Visit: Admitting: Cardiology
